# Patient Record
Sex: Female | Born: 1965
Health system: Southern US, Community
[De-identification: ages and names within clinical notes are randomized; demographics above are authoritative.]

## PROBLEM LIST (undated history)

## (undated) DIAGNOSIS — F319 Bipolar disorder, unspecified: Secondary | ICD-10-CM

## (undated) DIAGNOSIS — E039 Hypothyroidism, unspecified: Secondary | ICD-10-CM

## (undated) DIAGNOSIS — F1010 Alcohol abuse, uncomplicated: Secondary | ICD-10-CM

## (undated) DIAGNOSIS — F4310 Post-traumatic stress disorder, unspecified: Secondary | ICD-10-CM

---

## 2016-09-19 ENCOUNTER — Inpatient Hospital Stay (HOSPITAL_COMMUNITY)
Admission: EM | Admit: 2016-09-19 | Discharge: 2016-09-20 | DRG: 894 | Discharge status: Against Medical Advice | Payer: Self-pay | Attending: Internal Medicine | Admitting: Internal Medicine

## 2016-09-19 ENCOUNTER — Emergency Department (HOSPITAL_COMMUNITY): Payer: Self-pay

## 2016-09-19 ENCOUNTER — Encounter (HOSPITAL_COMMUNITY): Payer: Self-pay | Admitting: Emergency Medicine

## 2016-09-19 DIAGNOSIS — R4182 Altered mental status, unspecified: Secondary | ICD-10-CM

## 2016-09-19 DIAGNOSIS — Y92481 Parking lot as the place of occurrence of the external cause: Secondary | ICD-10-CM

## 2016-09-19 DIAGNOSIS — G9340 Encephalopathy, unspecified: Secondary | ICD-10-CM | POA: Diagnosis present

## 2016-09-19 DIAGNOSIS — F10120 Alcohol abuse with intoxication, uncomplicated: Principal | ICD-10-CM | POA: Diagnosis present

## 2016-09-19 DIAGNOSIS — Y907 Blood alcohol level of 200-239 mg/100 ml: Secondary | ICD-10-CM | POA: Diagnosis present

## 2016-09-19 DIAGNOSIS — T1490XA Injury, unspecified, initial encounter: Secondary | ICD-10-CM

## 2016-09-19 DIAGNOSIS — G92 Toxic encephalopathy: Secondary | ICD-10-CM | POA: Diagnosis present

## 2016-09-19 DIAGNOSIS — Z789 Other specified health status: Secondary | ICD-10-CM

## 2016-09-19 DIAGNOSIS — T68XXXA Hypothermia, initial encounter: Secondary | ICD-10-CM | POA: Diagnosis present

## 2016-09-19 DIAGNOSIS — R402432 Glasgow coma scale score 3-8, at arrival to emergency department: Secondary | ICD-10-CM | POA: Diagnosis present

## 2016-09-19 DIAGNOSIS — J9602 Acute respiratory failure with hypercapnia: Secondary | ICD-10-CM | POA: Diagnosis present

## 2016-09-19 LAB — CBC WITH DIFFERENTIAL/PLATELET
BASOS ABS: 0.1 10*3/uL (ref 0.0–0.1)
Basophils Relative: 1 %
EOS PCT: 3 %
Eosinophils Absolute: 0.2 10*3/uL (ref 0.0–0.7)
HEMATOCRIT: 37.8 % (ref 36.0–46.0)
Hemoglobin: 12.1 g/dL (ref 12.0–15.0)
LYMPHS PCT: 42 %
Lymphs Abs: 2.4 10*3/uL (ref 0.7–4.0)
MCH: 26.1 pg (ref 26.0–34.0)
MCHC: 32 g/dL (ref 30.0–36.0)
MCV: 81.5 fL (ref 78.0–100.0)
MONOS PCT: 6 %
Monocytes Absolute: 0.3 10*3/uL (ref 0.1–1.0)
NEUTROS PCT: 48 %
Neutro Abs: 2.6 10*3/uL (ref 1.7–7.7)
Platelets: 301 10*3/uL (ref 150–400)
RBC: 4.64 MIL/uL (ref 3.87–5.11)
RDW: 23.8 % — AB (ref 11.5–15.5)
WBC: 5.6 10*3/uL (ref 4.0–10.5)

## 2016-09-19 LAB — COMPREHENSIVE METABOLIC PANEL
ALBUMIN: 4.1 g/dL (ref 3.5–5.0)
ALT: 25 U/L (ref 14–54)
AST: 30 U/L (ref 15–41)
Alkaline Phosphatase: 55 U/L (ref 38–126)
Anion gap: 11 (ref 5–15)
BILIRUBIN TOTAL: 0.3 mg/dL (ref 0.3–1.2)
BUN: 5 mg/dL — AB (ref 6–20)
CHLORIDE: 111 mmol/L (ref 101–111)
CO2: 20 mmol/L — ABNORMAL LOW (ref 22–32)
CREATININE: 0.61 mg/dL (ref 0.44–1.00)
Calcium: 8.9 mg/dL (ref 8.9–10.3)
GFR calc Af Amer: >60 mL/min (ref >60)
GFR calc non Af Amer: 54 mL/min — ABNORMAL LOW (ref >60)
GLUCOSE: 95 mg/dL (ref 65–99)
POTASSIUM: 3.9 mmol/L (ref 3.5–5.1)
Sodium: 142 mmol/L (ref 135–145)
TOTAL PROTEIN: 6.8 g/dL (ref 6.5–8.1)

## 2016-09-19 LAB — URINALYSIS, ROUTINE W REFLEX MICROSCOPIC
Bilirubin Urine: NEGATIVE
Glucose, UA: NEGATIVE mg/dL
Hgb urine dipstick: NEGATIVE
Ketones, ur: NEGATIVE mg/dL
Leukocytes, UA: NEGATIVE
NITRITE: NEGATIVE
PH: 5.5 (ref 5.0–8.0)
Protein, ur: NEGATIVE mg/dL

## 2016-09-19 LAB — RAPID URINE DRUG SCREEN, HOSP PERFORMED
AMPHETAMINES: NOT DETECTED
BENZODIAZEPINES: NOT DETECTED
Barbiturates: NOT DETECTED
COCAINE: NOT DETECTED
OPIATES: NOT DETECTED
Tetrahydrocannabinol: NOT DETECTED

## 2016-09-19 LAB — I-STAT CHEM 8, ED
BUN: 5 mg/dL — ABNORMAL LOW (ref 6–20)
CHLORIDE: 107 mmol/L (ref 101–111)
Calcium, Ion: 1.11 mmol/L — ABNORMAL LOW (ref 1.15–1.40)
Creatinine, Ser: 0.9 mg/dL (ref 0.44–1.00)
Glucose, Bld: 97 mg/dL (ref 65–99)
HEMATOCRIT: 38 % (ref 36.0–46.0)
HEMOGLOBIN: 12.9 g/dL (ref 12.0–15.0)
POTASSIUM: 3.9 mmol/L (ref 3.5–5.1)
Sodium: 145 mmol/L (ref 135–145)
TCO2: 23 mmol/L (ref 0–100)

## 2016-09-19 LAB — ETHANOL: ALCOHOL ETHYL (B): 232 mg/dL — AB (ref <5)

## 2016-09-19 LAB — I-STAT TROPONIN, ED: TROPONIN I, POC: 0 ng/mL (ref 0.00–0.08)

## 2016-09-19 LAB — I-STAT CG4 LACTIC ACID, ED: LACTIC ACID, VENOUS: 1.99 mmol/L — AB (ref 0.5–1.9)

## 2016-09-19 LAB — I-STAT BETA HCG BLOOD, ED (MC, WL, AP ONLY): HCG, QUANTITATIVE: 8.8 m[IU]/mL — AB (ref <5)

## 2016-09-19 LAB — AMMONIA: Ammonia: 28 umol/L (ref 9–35)

## 2016-09-19 MED ORDER — SODIUM CHLORIDE 0.9 % IV BOLUS (SEPSIS)
1000.0000 mL | Freq: Once | INTRAVENOUS | Status: AC
  Administered 2016-09-19: 1000 mL via INTRAVENOUS

## 2016-09-19 MED ORDER — NALOXONE HCL 0.4 MG/ML IJ SOLN: 0.4000 mg | Freq: Once | INTRAMUSCULAR | Status: DC

## 2016-09-19 MED ORDER — LORAZEPAM 2 MG/ML IJ SOLN
INTRAMUSCULAR | Status: AC
  Administered 2016-09-19: 1 mg
  Filled 2016-09-19: qty 1

## 2016-09-19 NOTE — ED Notes (Signed)
Bair hugger placed on patient.  

## 2016-09-19 NOTE — ED Triage Notes (Signed)
[facility] after being found down in a parking lot. Pt is unresponsive and only response is to painful stimuli. Pt was given 1 mg of Narcan via EMS. Pt smells of Etoh. Pupils 3 mm bilaterally and sluggish.

## 2016-09-19 NOTE — ED Provider Notes (Signed)
[facility]-EMERGENCY DEPT Provider Note   CSN: [id number] Arrival date & time: 09/19/16  2145     History   Chief Complaint Chief Complaint  Patient presents with  . Altered Mental Status    HPI Mary Dodson is a 50 y.o. female.  HPI Mary Dodson is a 50 y.o. female presents to ED with complaint of AMS. Pt was found in a parking lot laying on the ground, unable to awaken. Pt smells of alcohol. EMS attempted to administer narcan 1g, with no change in mental status.   History reviewed. No pertinent past medical history.  There are no active problems to display for this patient.   No past surgical history on file.  OB History    No data available       Home Medications    Prior to Admission medications   Not on File    Family History History reviewed. No pertinent family history.  Social History Social History  Substance Use Topics  . Smoking status: Not on file  . Smokeless tobacco: Not on file  . Alcohol use Not on file     Allergies   Patient has no allergy information on record.   Review of Systems Review of Systems  Unable to perform ROS: Mental status change     Physical Exam Updated Vital Signs BP 131/79   Pulse 108   Temp (!) 95.9 F (35.5 C) (Rectal)   Resp 18   SpO2 100%   Physical Exam  Constitutional: She appears well-developed and well-nourished.  aggitated  HENT:  Head: Normocephalic.  Eyes: Conjunctivae and EOM are normal. Pupils are equal, round, and reactive to light.  Neck: Normal range of motion. Neck supple.  Cardiovascular: Regular rhythm and normal heart sounds.   tachycardic  Pulmonary/Chest: Effort normal and breath sounds normal. No respiratory distress. She has no wheezes. She has no rales.  Abdominal: Soft. Bowel sounds are normal. She exhibits no distension. There is no tenderness. There is no rebound.  Musculoskeletal: She exhibits no edema.  Neurological:  Does not follow directions, unable to answer  questions, moving around stretcher  Skin: Skin is warm and dry. Capillary refill takes less than 2 seconds.  Psychiatric: She has a normal mood and affect. Her behavior is normal.  Nursing note and vitals reviewed.    ED Treatments / Results  Labs (all labs ordered are listed, but only abnormal results are displayed) Labs Reviewed  CBC WITH DIFFERENTIAL/PLATELET - Abnormal; Notable for the following:       Result Value   RDW 23.8 (*)    All other components within normal limits  COMPREHENSIVE METABOLIC PANEL - Abnormal; Notable for the following:    CO2 20 (*)    BUN 5 (*)    GFR calc non Af Amer 54 (*)    All other components within normal limits  URINALYSIS, ROUTINE W REFLEX MICROSCOPIC - Abnormal; Notable for the following:    Specific Gravity, Urine <1.005 (*)    All other components within normal limits  ETHANOL - Abnormal; Notable for the following:    Alcohol, Ethyl (B) 232 (*)    All other components within normal limits  I-STAT BETA HCG BLOOD, ED ([facility], [facility], [facility] ONLY) - Abnormal; Notable for the following:    I-stat hCG, quantitative 8.8 (*)    All other components within normal limits  I-STAT CHEM 8, ED - Abnormal; Notable for the following:    BUN 5 (*)  Calcium, Ion 1.11 (*)    All other components within normal limits  I-STAT CG4 LACTIC ACID, ED - Abnormal; Notable for the following:    Lactic Acid, Venous 1.99 (*)    All other components within normal limits  I-STAT ARTERIAL BLOOD GAS, ED - Abnormal; Notable for the following:    pH, Arterial 7.133 (*)    pCO2 arterial 65.2 (*)    pO2, Arterial 238.0 (*)    Acid-base deficit 8.0 (*)    All other components within normal limits  AMMONIA  RAPID URINE DRUG SCREEN, HOSP PERFORMED  TRIGLYCERIDES  TROPONIN I  TROPONIN I  TROPONIN I  CBC  BASIC METABOLIC PANEL  MAGNESIUM  PHOSPHORUS  BLOOD GAS, ARTERIAL  TRIGLYCERIDES  OSMOLALITY  I-STAT TROPOININ, ED  I-STAT CG4 LACTIC ACID, ED    EKG  EKG  Interpretation  Date/Time:  Tuesday September 19 2016 22:53:59 EST Ventricular Rate:  104 PR Interval:    QRS Duration: 99 QT Interval:  357 QTC Calculation: 470 R Axis:   90 Text Interpretation:  Sinus tachycardia Borderline right axis deviation Consider anterior infarct Confirmed by HAVILAND MD, JULIE ([id number]) on 09/19/2016 10:55:39 PM       Radiology Ct Head Wo Contrast  Result Date: 09/19/2016 CLINICAL DATA:  Patient found down and unresponsive EXAM: CT HEAD WITHOUT CONTRAST CT CERVICAL SPINE WITHOUT CONTRAST TECHNIQUE: Multidetector CT imaging of the head and cervical spine was performed following the standard protocol without intravenous contrast. Multiplanar CT image reconstructions of the cervical spine were also generated. COMPARISON:  None. FINDINGS: CT HEAD FINDINGS Brain: No mass lesion, intraparenchymal hemorrhage or extra-axial collection. No evidence of acute cortical infarct. Brain parenchyma and CSF-containing spaces are normal for age. Vascular: No hyperdense vessel or unexpected calcification. Skull: Normal visualized skull base, calvarium and extracranial soft tissues. Sinuses/Orbits: No sinus fluid levels or advanced mucosal thickening. No mastoid effusion. Normal orbits. CT CERVICAL SPINE FINDINGS Alignment: No static subluxation. Facets are aligned. Occipital condyles are normally positioned. Skull base and vertebrae: No acute fracture. Soft tissues and spinal canal: No prevertebral fluid or swelling. No visible canal hematoma. Disc levels: No advanced spinal canal or neural foraminal stenosis. Upper chest: No pneumothorax, pulmonary nodule or pleural effusion. Other: Normal visualized paraspinal cervical soft tissues. IMPRESSION: 1. No acute intracranial abnormality. 2. No acute fracture or static subluxation of the cervical spine. Electronically Signed   By: Deatra RobinsonKevin  Herman M.D.   On: 09/19/2016 22:32   Ct Cervical Spine Wo Contrast  Result Date: 09/19/2016 CLINICAL DATA:   Patient found down and unresponsive EXAM: CT HEAD WITHOUT CONTRAST CT CERVICAL SPINE WITHOUT CONTRAST TECHNIQUE: Multidetector CT imaging of the head and cervical spine was performed following the standard protocol without intravenous contrast. Multiplanar CT image reconstructions of the cervical spine were also generated. COMPARISON:  None. FINDINGS: CT HEAD FINDINGS Brain: No mass lesion, intraparenchymal hemorrhage or extra-axial collection. No evidence of acute cortical infarct. Brain parenchyma and CSF-containing spaces are normal for age. Vascular: No hyperdense vessel or unexpected calcification. Skull: Normal visualized skull base, calvarium and extracranial soft tissues. Sinuses/Orbits: No sinus fluid levels or advanced mucosal thickening. No mastoid effusion. Normal orbits. CT CERVICAL SPINE FINDINGS Alignment: No static subluxation. Facets are aligned. Occipital condyles are normally positioned. Skull base and vertebrae: No acute fracture. Soft tissues and spinal canal: No prevertebral fluid or swelling. No visible canal hematoma. Disc levels: No advanced spinal canal or neural foraminal stenosis. Upper chest: No pneumothorax, pulmonary nodule or pleural effusion. Other:  Normal visualized paraspinal cervical soft tissues. IMPRESSION: 1. No acute intracranial abnormality. 2. No acute fracture or static subluxation of the cervical spine. Electronically Signed   By: Deatra [name] M.D.   On: 09/19/2016 22:32   Dg Chest Portable 1 View  Result Date: 09/20/2016 CLINICAL DATA:  Orogastric and endotracheal tube placement EXAM: PORTABLE CHEST 1 VIEW COMPARISON:  Chest radiograph 09/19/2016 FINDINGS: Endotracheal tube tip is at the level of the clavicular heads. Orogastric tube side port overlies the gastric body. The tip extends beyond the field of view. Cardiomediastinal contours are normal. No focal airspace consolidation or pulmonary edema. IMPRESSION: Radiographically appropriate positioning of  endotracheal and orogastric tubes. Electronically Signed   By: Deatra [name] M.D.   On: 09/20/2016 01:17   Dg Chest Portable 1 View  Result Date: 09/19/2016 CLINICAL DATA:  Altered mental status patient found down in parking lot EXAM: PORTABLE CHEST 1 VIEW COMPARISON:  None. FINDINGS: No acute infiltrate or effusion. Normal heart size. Minimally elevated right diaphragm. No pneumothorax. Surgical clips in the left neck. IMPRESSION: No radiographic evidence for acute cardiopulmonary abnormality Electronically Signed   By: Jasmine Pang M.D.   On: 09/19/2016 22:18    Procedures Procedures (including critical care time)  Medications Ordered in ED Medications  naloxone [facility]) injection 0.4 mg (0 mg Intravenous Hold 09/19/16 2311)  etomidate (AMIDATE) injection 20 mg (20 mg Intravenous See Procedure Record 09/20/16 0116)  succinylcholine (ANECTINE) injection 100 mg (100 mg Intravenous See Procedure Record 09/20/16 0117)  0.9 %  sodium chloride infusion (1,000 mL/hr Intravenous New Bag/Given 09/20/16 0024)  heparin injection 5,000 Units (not administered)  famotidine (PEPCID) IVPB 20 mg premix (not administered)  dextrose 5 %-0.45 % sodium chloride infusion (not administered)  ipratropium-albuterol (DUONEB) 0.5-2.5 (3) MG/3ML nebulizer solution 3 mL (not administered)  albuterol (PROVENTIL) (2.5 MG/3ML) 0.083% nebulizer solution 2.5 mg (not administered)  fentaNYL (SUBLIMAZE) injection 100 mcg (100 mcg Intravenous Given 09/20/16 0145)  fentaNYL (SUBLIMAZE) injection 100 mcg (not administered)  propofol (DIPRIVAN) 1000 MG/100ML infusion (not administered)  midazolam (VERSED) injection 2 mg (not administered)  midazolam (VERSED) injection 2 mg (not administered)  methylPREDNISolone sodium succinate (SOLU-MEDROL) 40 mg/mL injection 40 mg (not administered)  thiamine (B-1) injection 100 mg (not administered)  folic acid injection 1 mg (not administered)  sodium chloride 0.9 % bolus 1,000 mL (0  mLs Intravenous Stopped 09/19/16 2318)  sodium chloride 0.9 % bolus 1,000 mL (0 mLs Intravenous Stopped 09/20/16 0104)  LORazepam (ATIVAN) 2 MG/ML injection (1 mg  Given 09/19/16 2243)  etomidate (AMIDATE) injection (20 mg Intravenous Given 09/20/16 0020)  succinylcholine (ANECTINE) injection (100 mg Intravenous Given 09/20/16 0020)  midazolam (VERSED) injection 5 mg (5 mg Intravenous Given 09/20/16 0130)  fentaNYL (SUBLIMAZE) injection 100 mcg (100 mcg Intravenous Given 09/20/16 0101)     Initial Impression / Assessment and Plan / ED Course  I have reviewed the triage vital signs and the nursing notes.  Pertinent labs & imaging results that were available during my care of the patient were reviewed by me and considered in my medical decision making (see chart for details).  Clinical Course    10:31 PM Pt initically came to the room, seen by myself and Dr. Particia Nearing, will send to CT given mental status changes. Pt brought back to the room after CT, more awake, however aggitated, not following directions. Pt feels cold. Rectal temp 95.8. Labs pending. Will place on biar hugger, start IV fluids.   11:08 PM Patient became more  and more agitated, unable to follow directions, trying to climb out of the bed, fighting. 1 mg of Ativan given IV. Patient's lab work pretty much unremarkable, lactic is 1.99, normal electrolytes, normal CBC. Ammonia level is 28. Alcohol is 232. Urinalysis and drug screen pending.    Drug screen negative. Discussed with Dr. Julian ReilGardner with [facility] who came and saw pt, advised intubation in setting of GCS of 7. Pt is sedated with 1mg  of ativan given to him earlier, she is sleeping, snorring, protecting airway. Does not follow directions, does not open eyes on command, localizing to painful stimuli.   Intubated by Dr. Particia NearingHaviland. Spoke with Critical Care, will admit.   1:57 AM Pt again became more aggitated. Propofol up to 15mcg. BP soft in 90s systolic. Versed orderd 5mg  IV.     CRITICAL CARE Performed by: Jaynie CrumbleKIRICHENKO, Henny Strauch A Total critical care time: 30 minutes Critical care time was exclusive of separately billable procedures and treating other patients. Critical care was necessary to treat or prevent imminent or life-threatening deterioration. Critical care was time spent personally by me on the following activities: development of treatment plan with patient and/or surrogate as well as nursing, discussions with consultants, evaluation of patient's response to treatment, examination of patient, obtaining history from patient or surrogate, ordering and performing treatments and interventions, ordering and review of laboratory studies, ordering and review of radiographic studies, pulse oximetry and re-evaluation of patient's condition.     Final Clinical Impressions(s) / ED Diagnoses   Final diagnoses:  Airway intubation performed without difficulty  Altered mental status, unspecified altered mental status type    New Prescriptions New Prescriptions   No medications on file     Jaynie Crumbleatyana Taje Tondreau, PA-C 09/21/16 1516    Jacalyn LefevreJulie Haviland, MD 09/22/16 [id number]

## 2016-09-20 ENCOUNTER — Emergency Department (HOSPITAL_COMMUNITY): Payer: Self-pay

## 2016-09-20 DIAGNOSIS — Z789 Other specified health status: Secondary | ICD-10-CM | POA: Insufficient documentation

## 2016-09-20 DIAGNOSIS — F10929 Alcohol use, unspecified with intoxication, unspecified: Secondary | ICD-10-CM | POA: Insufficient documentation

## 2016-09-20 DIAGNOSIS — G9340 Encephalopathy, unspecified: Secondary | ICD-10-CM

## 2016-09-20 DIAGNOSIS — T68XXXA Hypothermia, initial encounter: Secondary | ICD-10-CM | POA: Insufficient documentation

## 2016-09-20 DIAGNOSIS — J441 Chronic obstructive pulmonary disease with (acute) exacerbation: Secondary | ICD-10-CM | POA: Insufficient documentation

## 2016-09-20 LAB — CBC
HCT: 34.9 % — ABNORMAL LOW (ref 36.0–46.0)
Hemoglobin: 10.9 g/dL — ABNORMAL LOW (ref 12.0–15.0)
MCH: 25.6 pg — AB (ref 26.0–34.0)
MCHC: 31.2 g/dL (ref 30.0–36.0)
MCV: 81.9 fL (ref 78.0–100.0)
PLATELETS: 319 10*3/uL (ref 150–400)
RBC: 4.26 MIL/uL (ref 3.87–5.11)
RDW: 23.7 % — AB (ref 11.5–15.5)
WBC: 6.5 10*3/uL (ref 4.0–10.5)

## 2016-09-20 LAB — I-STAT ARTERIAL BLOOD GAS, ED
Acid-base deficit: 8 mmol/L — ABNORMAL HIGH (ref 0.0–2.0)
BICARBONATE: 22 mmol/L (ref 20.0–28.0)
O2 Saturation: 100 %
PO2 ART: 238 mmHg — AB (ref 83.0–108.0)
Patient temperature: 97.6
TCO2: 24 mmol/L (ref 0–100)
pCO2 arterial: 65.2 mmHg (ref 32.0–48.0)
pH, Arterial: 7.133 — CL (ref 7.350–7.450)

## 2016-09-20 LAB — BASIC METABOLIC PANEL
Anion gap: 9 (ref 5–15)
BUN: 6 mg/dL (ref 6–20)
CO2: 19 mmol/L — ABNORMAL LOW (ref 22–32)
CREATININE: 0.61 mg/dL (ref 0.44–1.00)
Calcium: 7.2 mg/dL — ABNORMAL LOW (ref 8.9–10.3)
Chloride: 118 mmol/L — ABNORMAL HIGH (ref 101–111)
GFR calc Af Amer: >60 mL/min (ref >60)
GFR, EST NON AFRICAN AMERICAN: 54 mL/min — AB (ref >60)
GLUCOSE: 134 mg/dL — AB (ref 65–99)
POTASSIUM: 4.4 mmol/L (ref 3.5–5.1)
SODIUM: 146 mmol/L — AB (ref 135–145)

## 2016-09-20 LAB — BLOOD GAS, ARTERIAL
Acid-base deficit: 4.9 mmol/L — ABNORMAL HIGH (ref 0.0–2.0)
BICARBONATE: 20.2 mmol/L (ref 20.0–28.0)
DRAWN BY: 418751
FIO2: 60
LHR: 24 {breaths}/min
MECHVT: 440 mL
O2 Saturation: 98.8 %
PATIENT TEMPERATURE: 98.6
PCO2 ART: 41.5 mmHg (ref 32.0–48.0)
PEEP/CPAP: 5 cmH2O
PO2 ART: 185 mmHg — AB (ref 83.0–108.0)
pH, Arterial: 7.309 — ABNORMAL LOW (ref 7.350–7.450)

## 2016-09-20 LAB — MRSA PCR SCREENING: MRSA BY PCR: NEGATIVE

## 2016-09-20 LAB — VOLATILES,BLD-ACETONE,ETHANOL,ISOPROP,METHANOL
Acetone, blood: NEGATIVE % (ref 0.000–0.010)
ETHANOL, BLOOD: 0.07 % — AB (ref 0.000–0.010)
Isopropanol, blood: NEGATIVE % (ref 0.000–0.010)
Methanol, blood: NEGATIVE % (ref 0.000–0.010)

## 2016-09-20 LAB — I-STAT CG4 LACTIC ACID, ED: Lactic Acid, Venous: 1.51 mmol/L (ref 0.5–1.9)

## 2016-09-20 LAB — TRIGLYCERIDES: Triglycerides: 109 mg/dL (ref <150)

## 2016-09-20 LAB — MAGNESIUM: MAGNESIUM: 1.4 mg/dL — AB (ref 1.7–2.4)

## 2016-09-20 LAB — GLUCOSE, CAPILLARY: Glucose-Capillary: 79 mg/dL (ref 65–99)

## 2016-09-20 LAB — PHOSPHORUS: Phosphorus: 4.6 mg/dL (ref 2.5–4.6)

## 2016-09-20 LAB — TROPONIN I: Troponin I: <0.03 ng/mL (ref <0.03)

## 2016-09-20 MED ORDER — VITAMIN B-1 100 MG PO TABS: 100.0000 mg | ORAL_TABLET | Freq: Every day | ORAL | Status: DC

## 2016-09-20 MED ORDER — DEXTROSE-NACL 5-0.45 % IV SOLN
INTRAVENOUS | Status: DC
  Administered 2016-09-20: 03:00:00 via INTRAVENOUS

## 2016-09-20 MED ORDER — THIAMINE HCL 100 MG/ML IJ SOLN
100.0000 mg | Freq: Every day | INTRAMUSCULAR | Status: DC
  Administered 2016-09-20: 100 mg via INTRAVENOUS
  Filled 2016-09-20: qty 2

## 2016-09-20 MED ORDER — ETOMIDATE 2 MG/ML IV SOLN
INTRAVENOUS | Status: AC | PRN
Start: 2016-09-20 — End: 2016-09-20
  Administered 2016-09-20: 20 mg via INTRAVENOUS

## 2016-09-20 MED ORDER — LORAZEPAM 1 MG PO TABS: 1.0000 mg | ORAL_TABLET | Freq: Four times a day (QID) | ORAL | Status: DC | PRN

## 2016-09-20 MED ORDER — MAGNESIUM SULFATE 4 GM/100ML IV SOLN
4.0000 g | Freq: Once | INTRAVENOUS | Status: AC
  Administered 2016-09-20: 4 g via INTRAVENOUS
  Filled 2016-09-20: qty 100

## 2016-09-20 MED ORDER — ALBUTEROL SULFATE (2.5 MG/3ML) 0.083% IN NEBU: 2.5000 mg | INHALATION_SOLUTION | RESPIRATORY_TRACT | Status: DC | PRN

## 2016-09-20 MED ORDER — PROPOFOL 1000 MG/100ML IV EMUL
INTRAVENOUS | Status: AC
  Administered 2016-09-20: 10 ug/kg/min via INTRAVENOUS
  Filled 2016-09-20: qty 100

## 2016-09-20 MED ORDER — LORAZEPAM 2 MG/ML IJ SOLN: 1.0000 mg | Freq: Four times a day (QID) | INTRAMUSCULAR | Status: DC | PRN

## 2016-09-20 MED ORDER — ORAL CARE MOUTH RINSE: 15.0000 mL | Freq: Four times a day (QID) | OROMUCOSAL | Status: DC

## 2016-09-20 MED ORDER — FENTANYL CITRATE (PF) 100 MCG/2ML IJ SOLN
100.0000 ug | Freq: Once | INTRAMUSCULAR | Status: AC
  Administered 2016-09-20: 100 ug via INTRAVENOUS

## 2016-09-20 MED ORDER — METHYLPREDNISOLONE SODIUM SUCC 40 MG IJ SOLR
40.0000 mg | Freq: Two times a day (BID) | INTRAMUSCULAR | Status: DC
  Administered 2016-09-20: 40 mg via INTRAVENOUS
  Filled 2016-09-20 (×2): qty 1

## 2016-09-20 MED ORDER — MIDAZOLAM HCL 2 MG/2ML IJ SOLN
2.0000 mg | INTRAMUSCULAR | Status: AC | PRN
  Administered 2016-09-20 (×3): 2 mg via INTRAVENOUS
  Filled 2016-09-20 (×3): qty 2

## 2016-09-20 MED ORDER — HEPARIN SODIUM (PORCINE) 5000 UNIT/ML IJ SOLN
5000.0000 [IU] | Freq: Three times a day (TID) | INTRAMUSCULAR | Status: DC
  Administered 2016-09-20: 5000 [IU] via SUBCUTANEOUS
  Filled 2016-09-20 (×2): qty 1

## 2016-09-20 MED ORDER — FENTANYL CITRATE (PF) 100 MCG/2ML IJ SOLN
100.0000 ug | INTRAMUSCULAR | Status: DC | PRN
  Administered 2016-09-20 (×2): 100 ug via INTRAVENOUS
  Filled 2016-09-20 (×2): qty 2

## 2016-09-20 MED ORDER — SODIUM CHLORIDE 0.9 % IV SOLN
2.0000 g | Freq: Once | INTRAVENOUS | Status: AC
  Administered 2016-09-20: 2 g via INTRAVENOUS
  Filled 2016-09-20: qty 20

## 2016-09-20 MED ORDER — PROPOFOL 1000 MG/100ML IV EMUL
5.0000 ug/kg/min | INTRAVENOUS | Status: DC
  Administered 2016-09-20: 10 ug/kg/min via INTRAVENOUS

## 2016-09-20 MED ORDER — ADULT MULTIVITAMIN W/MINERALS CH
1.0000 | ORAL_TABLET | Freq: Every day | ORAL | Status: DC
  Filled 2016-09-20: qty 1

## 2016-09-20 MED ORDER — CHLORHEXIDINE GLUCONATE 0.12% ORAL RINSE (MEDLINE KIT)
15.0000 mL | Freq: Two times a day (BID) | OROMUCOSAL | Status: DC
  Administered 2016-09-20: 15 mL via OROMUCOSAL

## 2016-09-20 MED ORDER — FOLIC ACID 5 MG/ML IJ SOLN
1.0000 mg | Freq: Every day | INTRAMUSCULAR | Status: DC
  Administered 2016-09-20: 1 mg via INTRAVENOUS
  Filled 2016-09-20: qty 0.2

## 2016-09-20 MED ORDER — MIDAZOLAM HCL 2 MG/2ML IJ SOLN
2.0000 mg | INTRAMUSCULAR | Status: DC | PRN
  Administered 2016-09-20: 2 mg via INTRAVENOUS
  Filled 2016-09-20: qty 2

## 2016-09-20 MED ORDER — MIDAZOLAM HCL 2 MG/2ML IJ SOLN
5.0000 mg | Freq: Once | INTRAMUSCULAR | Status: AC
  Administered 2016-09-20: 5 mg via INTRAVENOUS
  Filled 2016-09-20: qty 6

## 2016-09-20 MED ORDER — ORAL CARE MOUTH RINSE: 15.0000 mL | Freq: Two times a day (BID) | OROMUCOSAL | Status: DC

## 2016-09-20 MED ORDER — SUCCINYLCHOLINE CHLORIDE 20 MG/ML IJ SOLN
100.0000 mg | Freq: Once | INTRAMUSCULAR | Status: DC
  Filled 2016-09-20: qty 5

## 2016-09-20 MED ORDER — FOLIC ACID 1 MG PO TABS: 1.0000 mg | ORAL_TABLET | Freq: Every day | ORAL | Status: DC

## 2016-09-20 MED ORDER — FENTANYL CITRATE (PF) 100 MCG/2ML IJ SOLN
INTRAMUSCULAR | Status: AC
  Filled 2016-09-20: qty 2

## 2016-09-20 MED ORDER — FENTANYL CITRATE (PF) 100 MCG/2ML IJ SOLN
100.0000 ug | INTRAMUSCULAR | Status: AC | PRN
  Administered 2016-09-20 (×3): 100 ug via INTRAVENOUS
  Filled 2016-09-20 (×3): qty 2

## 2016-09-20 MED ORDER — IPRATROPIUM-ALBUTEROL 0.5-2.5 (3) MG/3ML IN SOLN
3.0000 mL | Freq: Four times a day (QID) | RESPIRATORY_TRACT | Status: DC
  Administered 2016-09-20 (×2): 3 mL via RESPIRATORY_TRACT
  Filled 2016-09-20 (×2): qty 3

## 2016-09-20 MED ORDER — FAMOTIDINE IN NACL 20-0.9 MG/50ML-% IV SOLN
20.0000 mg | Freq: Two times a day (BID) | INTRAVENOUS | Status: DC
  Administered 2016-09-20 (×2): 20 mg via INTRAVENOUS
  Filled 2016-09-20 (×3): qty 50

## 2016-09-20 MED ORDER — PROPOFOL 1000 MG/100ML IV EMUL
0.0000 ug/kg/min | INTRAVENOUS | Status: DC
  Administered 2016-09-20 (×2): 60 ug/kg/min via INTRAVENOUS
  Filled 2016-09-20: qty 100

## 2016-09-20 MED ORDER — PROPOFOL 1000 MG/100ML IV EMUL
0.0000 ug/kg/min | INTRAVENOUS | Status: DC
  Administered 2016-09-20: 20 ug/kg/min via INTRAVENOUS
  Filled 2016-09-20: qty 100

## 2016-09-20 MED ORDER — SODIUM CHLORIDE 0.9 % IV SOLN
INTRAVENOUS | Status: DC | PRN
  Administered 2016-09-20: 1000 mL via INTRAVENOUS
  Administered 2016-09-20: 1000 mL/h via INTRAVENOUS

## 2016-09-20 MED ORDER — ETOMIDATE 2 MG/ML IV SOLN: 20.0000 mg | Freq: Once | INTRAVENOUS | Status: DC

## 2016-09-20 MED ORDER — CHLORHEXIDINE GLUCONATE 0.12% ORAL RINSE (MEDLINE KIT): 15.0000 mL | Freq: Two times a day (BID) | OROMUCOSAL | Status: DC

## 2016-09-20 MED ORDER — SUCCINYLCHOLINE CHLORIDE 20 MG/ML IJ SOLN
INTRAMUSCULAR | Status: AC | PRN
  Administered 2016-09-20: 100 mg via INTRAVENOUS

## 2016-09-20 MED ORDER — CHLORHEXIDINE GLUCONATE 0.12 % MT SOLN
15.0000 mL | Freq: Two times a day (BID) | OROMUCOSAL | Status: DC
  Administered 2016-09-20: 15 mL via OROMUCOSAL

## 2016-09-20 NOTE — ED Notes (Signed)
Pt attempting to rise up in bed again; attempting to obtain RASS goal at this time

## 2016-09-20 NOTE — ED Notes (Signed)
Pt attempting to sit up in bed and pulling at tubes and wires; pt pulled OG tube out; will replace when patient more sedated and comfortable

## 2016-09-20 NOTE — Progress Notes (Signed)
eLink Physician-Brief Progress Note Patient Name: Mary CreedCourtland C Dodson DOB: 10/09/1875 MRN: [id number]   Date of Service  09/20/2016  HPI/Events of Note  Called by RN to report elevated measured serum osmolality of 341  eICU Interventions  This is probably fully accounted for by ethanol. Nonetheless, volatile alcohol panel ordered     Intervention Category Major Interventions: Other:  Merwyn KatosDavid B Iasha Mccalister 09/20/2016, 2:20 AM

## 2016-09-20 NOTE — Progress Notes (Signed)
E-Link notified of ABG results. Order was to increase RR to 24bpm

## 2016-09-20 NOTE — H&P (Signed)
PULMONARY / CRITICAL CARE MEDICINE   Name: Mary Dodson MRN: [id number] DOB: 10/09/1875    ADMISSION DATE:  09/19/2016 CONSULTATION DATE:  12/13  REFERRING MD:  Dr. Particia Nearing EDP  CHIEF COMPLAINT:  Unresponsive  BRIEF  Female presents to [facility] emergency department with unknown age, identity. She was found down in a parking lot and per EMS smelled of alcohol. They were unable to arouse her even after administering Narcan. Upon arrival to the emergency department she was hypothermic and underwent CT scan of the head which was nonacute. After returning from CT she did wake up a bit, but was aggressive and agitated. She was given Ativan and GCS was then later noted to be 6 so she ultimately required intubation. PCCM asked to admit. Laboratory evaluation in the emergency department is significant for a lactic acid 1.99, CO2 20,alcohol level 237.    SUBJECTIVE/OVERNIGHT/INTERVAL HX  09/20/16 10:13 AM-demanding extubation. Oriented. Banging on rails   VITAL SIGNS: BP 113/61   Pulse 85   Temp 98.8 F (37.1 C) (Oral)   Resp 12   Ht 5\' 4"  (1.626 m)   Wt 71.3 kg (157 lb 3 oz)   SpO2 100%   BMI 26.98 kg/m   HEMODYNAMICS:    VENTILATOR SETTINGS: Vent Mode: PSV;CPAP FiO2 (%):  [40 %-100 %] 40 % Set Rate:  [14 bmp-24 bmp] 24 bmp Vt Set:  [440 mL] 440 mL PEEP:  [5 cmH20] 5 cmH20 Pressure Support:  [5 cmH20-10 cmH20] 5 cmH20 Plateau Pressure:  [16 cmH20-18 cmH20] 18 cmH20  INTAKE / OUTPUT: I/O last 3 completed shifts: In: 63 [I.V.:3885; IV Piggyback:2050] Out: 1150 [Urine:1150]  PHYSICAL EXAMINATION: General:  Female who appears age 59-50 of normal body habitus  Neuro:  Demanding extubation. Oriented. Nods she knows her name and can write who she is or talk after etubation HEENT:  [place]/AT, PERRL, no JVD Cardiovascular:  RRR, no MRG Lungs:NO expiratory wheeze Abdomen:  Soft, non-distended Musculoskeletal:  No acute deformity Skin:  Grossly intact, no evidence of  IVDA  LABS:  PULMONARY  Recent Labs Lab 09/19/16 2213 09/20/16 0110 09/20/16 0340  PHART  --  7.133* 7.309*  PCO2ART  --  65.2* 41.5  PO2ART  --  238.0* 185*  HCO3  --  22.0 20.2  TCO2 23 24  --   O2SAT  --  100.0 98.8    CBC  Recent Labs Lab 09/19/16 2210 09/19/16 2213 09/20/16 0122  HGB 12.1 12.9 10.9*  HCT 37.8 38.0 34.9*  WBC 5.6  --  6.5  PLT 301  --  319    COAGULATION No results for input(s): INR in the last 168 hours.  CARDIAC   Recent Labs Lab 09/20/16 0122  TROPONINI <0.03   No results for input(s): PROBNP in the last 168 hours.   CHEMISTRY  Recent Labs Lab 09/19/16 2210 09/19/16 2213 09/20/16 0122  NA 142 145 146*  K 3.9 3.9 4.4  CL 111 107 118*  CO2 20*  --  19*  GLUCOSE 95 97 134*  BUN 5* 5* 6  CREATININE 0.61 0.90 0.61  CALCIUM 8.9  --  7.2*  MG  --   --  1.4*  PHOS  --   --  4.6   Estimated Creatinine Clearance: 0 mL/min (by C-G formula based on SCr of 0.61 mg/dL).   LIVER  Recent Labs Lab 09/19/16 2210  AST 30  ALT 25  ALKPHOS 55  BILITOT 0.3  PROT 6.8  ALBUMIN 4.1  INFECTIOUS  Recent Labs Lab 09/19/16 2235 09/20/16 0124  LATICACIDVEN 1.99* 1.51     ENDOCRINE CBG (last 3)   Recent Labs  09/20/16 0254  GLUCAP 79         IMAGING x48h  - image(s) personally visualized  -   highlighted in bold Ct Head Wo Contrast  Result Date: 09/19/2016 CLINICAL DATA:  Patient found down and unresponsive EXAM: CT HEAD WITHOUT CONTRAST CT CERVICAL SPINE WITHOUT CONTRAST TECHNIQUE: Multidetector CT imaging of the head and cervical spine was performed following the standard protocol without intravenous contrast. Multiplanar CT image reconstructions of the cervical spine were also generated. COMPARISON:  None. FINDINGS: CT HEAD FINDINGS Brain: No mass lesion, intraparenchymal hemorrhage or extra-axial collection. No evidence of acute cortical infarct. Brain parenchyma and CSF-containing spaces are normal for age.  Vascular: No hyperdense vessel or unexpected calcification. Skull: Normal visualized skull base, calvarium and extracranial soft tissues. Sinuses/Orbits: No sinus fluid levels or advanced mucosal thickening. No mastoid effusion. Normal orbits. CT CERVICAL SPINE FINDINGS Alignment: No static subluxation. Facets are aligned. Occipital condyles are normally positioned. Skull base and vertebrae: No acute fracture. Soft tissues and spinal canal: No prevertebral fluid or swelling. No visible canal hematoma. Disc levels: No advanced spinal canal or neural foraminal stenosis. Upper chest: No pneumothorax, pulmonary nodule or pleural effusion. Other: Normal visualized paraspinal cervical soft tissues. IMPRESSION: 1. No acute intracranial abnormality. 2. No acute fracture or static subluxation of the cervical spine. Electronically Signed   By: Deatra RobinsonKevin  Herman M.D.   On: 09/19/2016 22:32   Ct Cervical Spine Wo Contrast  Result Date: 09/19/2016 CLINICAL DATA:  Patient found down and unresponsive EXAM: CT HEAD WITHOUT CONTRAST CT CERVICAL SPINE WITHOUT CONTRAST TECHNIQUE: Multidetector CT imaging of the head and cervical spine was performed following the standard protocol without intravenous contrast. Multiplanar CT image reconstructions of the cervical spine were also generated. COMPARISON:  None. FINDINGS: CT HEAD FINDINGS Brain: No mass lesion, intraparenchymal hemorrhage or extra-axial collection. No evidence of acute cortical infarct. Brain parenchyma and CSF-containing spaces are normal for age. Vascular: No hyperdense vessel or unexpected calcification. Skull: Normal visualized skull base, calvarium and extracranial soft tissues. Sinuses/Orbits: No sinus fluid levels or advanced mucosal thickening. No mastoid effusion. Normal orbits. CT CERVICAL SPINE FINDINGS Alignment: No static subluxation. Facets are aligned. Occipital condyles are normally positioned. Skull base and vertebrae: No acute fracture. Soft tissues and  spinal canal: No prevertebral fluid or swelling. No visible canal hematoma. Disc levels: No advanced spinal canal or neural foraminal stenosis. Upper chest: No pneumothorax, pulmonary nodule or pleural effusion. Other: Normal visualized paraspinal cervical soft tissues. IMPRESSION: 1. No acute intracranial abnormality. 2. No acute fracture or static subluxation of the cervical spine. Electronically Signed   By: Deatra RobinsonKevin  Herman M.D.   On: 09/19/2016 22:32   Dg Chest Portable 1 View  Result Date: 09/20/2016 CLINICAL DATA:  Orogastric and endotracheal tube placement EXAM: PORTABLE CHEST 1 VIEW COMPARISON:  Chest radiograph 09/19/2016 FINDINGS: Endotracheal tube tip is at the level of the clavicular heads. Orogastric tube side port overlies the gastric body. The tip extends beyond the field of view. Cardiomediastinal contours are normal. No focal airspace consolidation or pulmonary edema. IMPRESSION: Radiographically appropriate positioning of endotracheal and orogastric tubes. Electronically Signed   By: Deatra RobinsonKevin  Herman M.D.   On: 09/20/2016 01:17   Dg Chest Portable 1 View  Result Date: 09/19/2016 CLINICAL DATA:  Altered mental status patient found down in parking lot EXAM: PORTABLE CHEST 1  VIEW COMPARISON:  None. FINDINGS: No acute infiltrate or effusion. Normal heart size. Minimally elevated right diaphragm. No pneumothorax. Surgical clips in the left neck. IMPRESSION: No radiographic evidence for acute cardiopulmonary abnormality Electronically Signed   By: Jasmine PangKim  Fujinaga M.D.   On: 09/19/2016 22:18       STUDIES:  CT head/Cspine 12/12 > No acute intracranial abnormality. No acute fracture or static subluxation of the cervical spine.  CULTURES:   ANTIBIOTICS:   SIGNIFICANT EVENTS: 12/12 found down unresponsive in parking lot  LINES/TUBES: ETT 12/13 >  DISCUSSION:   ASSESSMENT / PLAN:  PULMONARY A: Acute hypercarbic respiratory failure Concern COPD exacerbation Tobacco  abuse  -meets extubation criteria  P:   extubate  CARDIOVASCULAR   Recent Labs Lab 09/20/16 0122  TROPONINI <0.03    A:  No known issues  P:  monitor  RENAL A:   hypomag andhypocalcemia  P:   reppletemag and calcium Follow BMP  GASTROINTESTINAL A:   No acute issues  P:   Can eat a4h after extubation and following protoocl PPI due to etoh  HEMATOLOGIC A:   No acute issues  P:  SQ heparin for VTE ppx   INFECTIOUS  A:   Hypothermia - likely from being down outside. 1L warm NS administered in ED and bair hugger applied   - normal 09/20/2016   P:   Follow CBC and fever curve Defer ABX for now  ENDOCRINE A:   No acute issues    P:   Follow glucose on chem  NEUROLOGIC A:   Acute metabolic/toxic encephalopathy ETOH abuse/intoxication   - resolved but not angry (not confused) dmemanding extubation. UDS negative. ETOH 232 on admit  P:   RASS goal: - 1 to -2 Dc Propofol infusion Dc PRN fentanyl Dc PRN versed Thamine, folate, IVF with dextrose.  CIWA protocol  FAMILY  - Updates: NO family known.   - Inter-disciplinary family meet or Palliative Care meeting due by:  12/21  DISPO posislbe dc home next 24h after exdtubation      Dr. Kalman ShanMurali Yesmin Mutch, M.D., [facility]C.P Pulmonary and Critical Care Medicine Staff Physician [facility] [place] [facility] Pager: [phone], If no answer or between  15:00h - 7:00h: call [phone]  09/20/2016 10:08 AM

## 2016-09-20 NOTE — H&P (Signed)
PULMONARY / CRITICAL CARE MEDICINE   Name: Mary Dodson MRN: [id number] DOB: 10/09/1875    ADMISSION DATE:  09/19/2016 CONSULTATION DATE:  12/13  REFERRING MD:  Dr. Particia Nearing EDP  CHIEF COMPLAINT:  Unresponsive  HISTORY OF PRESENT ILLNESS:   Female presents to [facility] emergency department with unknown age, identity. She was found down in a parking lot and per EMS smelled of alcohol. They were unable to arouse her even after administering Narcan. Upon arrival to the emergency department she was hypothermic and underwent CT scan of the head which was nonacute. After returning from CT she did wake up a bit, but was aggressive and agitated. She was given Ativan and GCS was then later noted to be 6 so she ultimately required intubation. PCCM asked to admit. Laboratory evaluation in the emergency department is significant for a lactic acid 1.99, CO2 20,alcohol level 237.  PAST MEDICAL HISTORY :  She  has no past medical history on file.  PAST SURGICAL HISTORY: She  has no past surgical history on file.  Allergies not on file  No current facility-administered medications on file prior to encounter.    No current outpatient prescriptions on file prior to encounter.    FAMILY HISTORY:  Her has no family status information on file.    SOCIAL HISTORY: She    REVIEW OF SYSTEMS:   unable  SUBJECTIVE:  unable  VITAL SIGNS: BP 120/78   Pulse (!) 121   Temp (!) 95.9 F (35.5 C) (Rectal)   Resp 20   Ht 5\' 4"  (1.626 m)   Wt 63.5 kg (140 lb)   SpO2 98%   BMI 24.03 kg/m   HEMODYNAMICS:    VENTILATOR SETTINGS: Vent Mode: PRVC FiO2 (%):  [60 %] 60 % Set Rate:  [14 bmp] 14 bmp Vt Set:  [440 mL] 440 mL PEEP:  [5 cmH20] 5 cmH20 Plateau Pressure:  [16 cmH20] 16 cmH20  INTAKE / OUTPUT: No intake/output data recorded.  PHYSICAL EXAMINATION: General:  Female who appears age 66-50 of normal body habitus  Neuro:  Somewhat agitated on vent HEENT:  [place]/AT, PERRL, no  JVD Cardiovascular:  RRR, no MRG Lungs:  Faint expiratory wheeze Abdomen:  Soft, non-distended Musculoskeletal:  No acute deformity Skin:  Grossly intact, no evidence of IVDA  LABS:  BMET  Recent Labs Lab 09/19/16 2210 09/19/16 2213  NA 142 145  K 3.9 3.9  CL 111 107  CO2 20*  --   BUN 5* 5*  CREATININE 0.61 0.90  GLUCOSE 95 97    Electrolytes  Recent Labs Lab 09/19/16 2210  CALCIUM 8.9    CBC  Recent Labs Lab 09/19/16 2210 09/19/16 2213  WBC 5.6  --   HGB 12.1 12.9  HCT 37.8 38.0  PLT 301  --     Coag's No results for input(s): APTT, INR in the last 168 hours.  Sepsis Markers  Recent Labs Lab 09/19/16 2235  LATICACIDVEN 1.99*    ABG No results for input(s): PHART, PCO2ART, PO2ART in the last 168 hours.  Liver Enzymes  Recent Labs Lab 09/19/16 2210  AST 30  ALT 25  ALKPHOS 55  BILITOT 0.3  ALBUMIN 4.1    Cardiac Enzymes No results for input(s): TROPONINI, PROBNP in the last 168 hours.  Glucose No results for input(s): GLUCAP in the last 168 hours.  Imaging Ct Head Wo Contrast  Result Date: 09/19/2016 CLINICAL DATA:  Patient found down and unresponsive EXAM: CT HEAD WITHOUT CONTRAST  CT CERVICAL SPINE WITHOUT CONTRAST TECHNIQUE: Multidetector CT imaging of the head and cervical spine was performed following the standard protocol without intravenous contrast. Multiplanar CT image reconstructions of the cervical spine were also generated. COMPARISON:  None. FINDINGS: CT HEAD FINDINGS Brain: No mass lesion, intraparenchymal hemorrhage or extra-axial collection. No evidence of acute cortical infarct. Brain parenchyma and CSF-containing spaces are normal for age. Vascular: No hyperdense vessel or unexpected calcification. Skull: Normal visualized skull base, calvarium and extracranial soft tissues. Sinuses/Orbits: No sinus fluid levels or advanced mucosal thickening. No mastoid effusion. Normal orbits. CT CERVICAL SPINE FINDINGS Alignment: No  static subluxation. Facets are aligned. Occipital condyles are normally positioned. Skull base and vertebrae: No acute fracture. Soft tissues and spinal canal: No prevertebral fluid or swelling. No visible canal hematoma. Disc levels: No advanced spinal canal or neural foraminal stenosis. Upper chest: No pneumothorax, pulmonary nodule or pleural effusion. Other: Normal visualized paraspinal cervical soft tissues. IMPRESSION: 1. No acute intracranial abnormality. 2. No acute fracture or static subluxation of the cervical spine. Electronically Signed   By: Deatra RobinsonKevin  Herman M.D.   On: 09/19/2016 22:32   Ct Cervical Spine Wo Contrast  Result Date: 09/19/2016 CLINICAL DATA:  Patient found down and unresponsive EXAM: CT HEAD WITHOUT CONTRAST CT CERVICAL SPINE WITHOUT CONTRAST TECHNIQUE: Multidetector CT imaging of the head and cervical spine was performed following the standard protocol without intravenous contrast. Multiplanar CT image reconstructions of the cervical spine were also generated. COMPARISON:  None. FINDINGS: CT HEAD FINDINGS Brain: No mass lesion, intraparenchymal hemorrhage or extra-axial collection. No evidence of acute cortical infarct. Brain parenchyma and CSF-containing spaces are normal for age. Vascular: No hyperdense vessel or unexpected calcification. Skull: Normal visualized skull base, calvarium and extracranial soft tissues. Sinuses/Orbits: No sinus fluid levels or advanced mucosal thickening. No mastoid effusion. Normal orbits. CT CERVICAL SPINE FINDINGS Alignment: No static subluxation. Facets are aligned. Occipital condyles are normally positioned. Skull base and vertebrae: No acute fracture. Soft tissues and spinal canal: No prevertebral fluid or swelling. No visible canal hematoma. Disc levels: No advanced spinal canal or neural foraminal stenosis. Upper chest: No pneumothorax, pulmonary nodule or pleural effusion. Other: Normal visualized paraspinal cervical soft tissues. IMPRESSION: 1.  No acute intracranial abnormality. 2. No acute fracture or static subluxation of the cervical spine. Electronically Signed   By: Deatra RobinsonKevin  Herman M.D.   On: 09/19/2016 22:32   Dg Chest Portable 1 View  Result Date: 09/19/2016 CLINICAL DATA:  Altered mental status patient found down in parking lot EXAM: PORTABLE CHEST 1 VIEW COMPARISON:  None. FINDINGS: No acute infiltrate or effusion. Normal heart size. Minimally elevated right diaphragm. No pneumothorax. Surgical clips in the left neck. IMPRESSION: No radiographic evidence for acute cardiopulmonary abnormality Electronically Signed   By: Jasmine PangKim  Fujinaga M.D.   On: 09/19/2016 22:18     STUDIES:  CT head/Cspine 12/12 > No acute intracranial abnormality. No acute fracture or static subluxation of the cervical spine.  CULTURES:   ANTIBIOTICS:   SIGNIFICANT EVENTS: 12/12 found down unresponsive in parking lot  LINES/TUBES: ETT 12/13 >  DISCUSSION:   ASSESSMENT / PLAN:  PULMONARY A: Acute hypercarbic respiratory failure Concern COPD exacerbation Tobacco abuse  P:   Full vent support ABG reviewed, will increase respiratory rate CXR Scheduled and PRN nebs Solumedrol 40mg  q 12h VAP bundle  CARDIOVASCULAR A:  No known issues  P:  Telemetry Trend troponin  RENAL A:   No acute issues  P:   Follow BMP  GASTROINTESTINAL A:  No acute issues  P:   NPO Pepcid for SUP  HEMATOLOGIC A:   No acute issues  P:  SQ heparin for VTE ppx Follow CBC  INFECTIOUS A:   Hypothermia - likely from being down outside. 1L warm NS administered in ED and bair hugger applied  P:   Follow CBC and fever curve Defer ABX for now  ENDOCRINE A:   No acute issues    P:   Follow glucose on chem  NEUROLOGIC A:   Acute metabolic/toxic encephalopathy ETOH abuse/intoxication  P:   RASS goal: - 1 to -2 Propofol infusion PRN fentanyl PRN versed Assess osmolar gap UDS pending Thamine, folate, IVF with dextrose.     FAMILY  - Updates: NO family known  - Inter-disciplinary family meet or Palliative Care meeting due by:  12/21   Joneen RoachPaul Adalea Handler, AGACNP-BC [facility] Pager [phone] or [phone]  09/20/2016 1:36 AM

## 2016-09-20 NOTE — Progress Notes (Signed)
Pt continue to be agitated, but weaned on ventilator. MD at the bedside, extubated pt personally. Pt alert,very irritable and agitated. States her name Mary Dodson, DOD 2065/11/26,"I live in the hotel down the street, allergy to Morphine" When asked the reaction, pt states "Bad, we already go over it, it is bad". MD and pharmacy notified and aware. No complication noted. Continue to monitor

## 2016-09-20 NOTE — Progress Notes (Signed)
Upon arrival to patient room to extubate patient, per MD order, patient had already been extubated by MD.  Patient sats currently 97%.  Vitals are stable.  Patient able to speak.  No evidence of stridor.  Will continue to monitor.

## 2016-09-20 NOTE — Progress Notes (Signed)
Patient requesting to leave AMA. Patient very irritable and agitated. CSW signing off as Patient refusing interventions. Please contact should new need(s) arise.    Enos FlingAshley Coralie Stanke, MSW, LCSW [facility]M Clinical Social Worker [phone]

## 2016-09-20 NOTE — Progress Notes (Signed)
AMA was signed by patient, MD and RN.  Pt states "no money for bus". Money for bus was given. IVs removed. Pt belongings(glassess, cigaret, lighter, shoes, pants, shirt and coat (which both were cut prior to admission on ICU floor) were walked by pt and pt walked out from ICU.

## 2016-09-20 NOTE — ED Notes (Signed)
Report called and given to nurse on 

## 2016-09-21 LAB — OSMOLALITY: OSMOLALITY: 341 mosm/kg — AB (ref 275–295)

## 2016-09-22 ENCOUNTER — Emergency Department (HOSPITAL_COMMUNITY): Payer: Medicaid - Out of State

## 2016-09-22 ENCOUNTER — Encounter (HOSPITAL_COMMUNITY): Payer: Self-pay | Admitting: *Deleted

## 2016-09-22 ENCOUNTER — Emergency Department (HOSPITAL_COMMUNITY)
Admission: EM | Admit: 2016-09-22 | Discharge: 2016-09-23 | Disposition: A | Discharge status: Home / Self Care | Payer: Medicaid - Out of State | Attending: Emergency Medicine | Admitting: Emergency Medicine

## 2016-09-22 DIAGNOSIS — F10920 Alcohol use, unspecified with intoxication, uncomplicated: Secondary | ICD-10-CM

## 2016-09-22 DIAGNOSIS — R51 Headache: Secondary | ICD-10-CM | POA: Insufficient documentation

## 2016-09-22 DIAGNOSIS — F17200 Nicotine dependence, unspecified, uncomplicated: Secondary | ICD-10-CM | POA: Insufficient documentation

## 2016-09-22 DIAGNOSIS — R519 Headache, unspecified: Secondary | ICD-10-CM

## 2016-09-22 DIAGNOSIS — F10120 Alcohol abuse with intoxication, uncomplicated: Secondary | ICD-10-CM | POA: Insufficient documentation

## 2016-09-22 HISTORY — DX: Post-traumatic stress disorder, unspecified: F43.10

## 2016-09-22 NOTE — ED Triage Notes (Signed)
[facility] ([facility] [address] found asleep/passed out in hotel Engineer, structuralelevator. Pt has a room at the hotel and her bank card and some of her other belongings are there.   Admits to 2 "edge beers", found asleep in hotel elevator, reports "wanting detox", alert, animated, NAD, calm, poor concentration, slurred speech, smells of ETOH. Abrasions noted to L knee, and multiple bilateral fingers.   EMS reports pt was alert, appropriate, ambulatory with UN-steady gait. VSS. When asked about complaints she states, "all of the above".

## 2016-09-22 NOTE — ED Notes (Signed)
No changes. Pt sleeping/ resting, NAD, calm, rise and fall of chest noted, repositions self, sitter present.  

## 2016-09-22 NOTE — ED Provider Notes (Signed)
[facility]-EMERGENCY DEPT Provider Note   CSN: [id number] Arrival date & time: 09/22/16  2128 By signing my name below, I, Linus GalasMaharshi Patel, attest that this documentation has been prepared under the direction and in the presence of TRW AutomotiveKelly Semaj Coburn, PA-C. Electronically Signed: Linus GalasMaharshi Patel, ED Scribe. 09/23/16. 10:42 PM.   History   Chief Complaint Chief Complaint  Patient presents with  . Alcohol Intoxication    The history is provided by the patient and the EMS personnel. The history is limited by the condition of the patient. No language interpreter was used.   LEVEL 5 CAVEAT DUE ALCOHOL INTOXICATION  HPI Comments: Mary Dodson is a 50 y.o. female who presents to the Emergency Department via EMS with a PMHx of PTSD complaining of alcohol intoxication. As per EMS, pt was found asleep in a hotel elevator. When the pt was asked why she was here she did not know why. She reports a HA but does not know why it hurts. Pt denies any SI, HI, or any other symptoms at this time.    Past Medical History:  Diagnosis Date  . PTSD (post-traumatic stress disorder)     There are no active problems to display for this patient.   History reviewed. No pertinent surgical history.  OB History    No data available       Home Medications    Prior to Admission medications   Medication Sig Start Date End Date Taking? Authorizing Provider  GABAPENTIN PO Take by mouth.   Yes Historical Provider, MD  QUEtiapine Fumarate (SEROQUEL PO) Take by mouth.   Yes Historical Provider, MD    Family History History reviewed. No pertinent family history.  Social History Social History  Substance Use Topics  . Smoking status: Current Every Day Smoker  . Smokeless tobacco: Never Used  . Alcohol use Yes     Allergies   Morphine and related   Review of Systems Review of Systems A complete 10 system review of systems was obtained and all systems are negative except as noted in the HPI and PMH.     Physical Exam Updated Vital Signs BP 108/64 (BP Location: Right Arm)   Pulse 89   Temp 98.1 F (36.7 C) (Oral)   Resp 22   SpO2 96%   Physical Exam  Constitutional: She is oriented to person, place, and time. She appears well-developed and well-nourished. No distress.  Nontoxic and in NAD. Sleeping on initial presentation.  HENT:  Head: Normocephalic and atraumatic.  Eyes: Conjunctivae and EOM are normal. No scleral icterus.  Neck: Normal range of motion.  Cardiovascular: Normal rate, regular rhythm and intact distal pulses.   Pulmonary/Chest: Effort normal. No respiratory distress. She has no wheezes. She has no rales.  Respirations even and unlabored  Musculoskeletal: Normal range of motion.  Neurological: She is alert and oriented to person, place, and time. She exhibits normal muscle tone. Coordination normal.  GCS 15. Patient moving all extremities.  Skin: Skin is warm and dry. No rash noted. She is not diaphoretic. No pallor.  Psychiatric: She has a normal mood and affect. Her behavior is normal.  Nursing note and vitals reviewed.    ED Treatments / Results  DIAGNOSTIC STUDIES: Oxygen Saturation is 98% on room air, normal by my interpretation.    COORDINATION OF CARE: 10:43 PM  Discussed treatment plan with pt at bedside and pt agreed to plan.  Labs (all labs ordered are listed, but only abnormal results are displayed) Labs Reviewed -  No data to display  EKG  EKG Interpretation None       Radiology Ct Head Wo Contrast  Result Date: 09/22/2016 CLINICAL DATA:  Initial valuation for acute headache, possible fall. EXAM: CT HEAD WITHOUT CONTRAST TECHNIQUE: Contiguous axial images were obtained from the base of the skull through the vertex without intravenous contrast. COMPARISON:  None. FINDINGS: Brain: Mildly advanced cerebral atrophy for patient age. No acute intracranial hemorrhage. No evidence for acute large vessel territory infarct. No mass lesion,  midline shift or mass effect. No hydrocephalus. No extra-axial fluid collection. Vascular: No hyperdense vessel. Skull: Scalp soft tissues demonstrate no acute abnormality. Calvarium intact. Sinuses/Orbits: The globes error soft tissues within normal limits. Visualized paranasal sinuses and mastoid air cells are clear. IMPRESSION: 1. No acute intracranial process identified. 2. Mild generalized cerebral atrophy. Electronically Signed   By: Rise MuBenjamin  McClintock M.D.   On: 09/22/2016 23:58    Procedures Procedures (including critical care time)  Medications Ordered in ED Medications - No data to display   Initial Impression / Assessment and Plan / ED Course  I have reviewed the triage vital signs and the nursing notes.  Pertinent labs & imaging results that were available during my care of the patient were reviewed by me and considered in my medical decision making (see chart for details).  Clinical Course     50 year old female presents to the emergency department with uncomplicated acute intoxication. She has been allowed to sober in the emergency department and she is now requesting discharge. Vitals stable. No indication for further emergent workup. Patient discharged in satisfactory condition. Resource guide provided.   Final Clinical Impressions(s) / ED Diagnoses   Final diagnoses:  Alcoholic intoxication without complication (HCC)  Bad headache    New Prescriptions New Prescriptions   No medications on file    I personally performed the services described in this documentation, which was scribed in my presence. The recorded information has been reviewed and is accurate.       Antony MaduraKelly Trayvon Trumbull, PA-C 09/23/16 [id number]    Jacalyn LefevreJulie Haviland, MD 09/23/16 [phone]

## 2016-09-23 ENCOUNTER — Emergency Department (HOSPITAL_COMMUNITY)
Admission: EM | Admit: 2016-09-23 | Discharge: 2016-09-23 | Disposition: A | Discharge status: Home / Self Care | Payer: Medicaid - Out of State | Attending: Emergency Medicine | Admitting: Emergency Medicine

## 2016-09-23 ENCOUNTER — Encounter (HOSPITAL_COMMUNITY): Payer: Self-pay | Admitting: *Deleted

## 2016-09-23 DIAGNOSIS — F10929 Alcohol use, unspecified with intoxication, unspecified: Secondary | ICD-10-CM

## 2016-09-23 DIAGNOSIS — F10230 Alcohol dependence with withdrawal, uncomplicated: Secondary | ICD-10-CM

## 2016-09-23 DIAGNOSIS — R51 Headache: Secondary | ICD-10-CM | POA: Insufficient documentation

## 2016-09-23 DIAGNOSIS — F17200 Nicotine dependence, unspecified, uncomplicated: Secondary | ICD-10-CM | POA: Insufficient documentation

## 2016-09-23 DIAGNOSIS — F10239 Alcohol dependence with withdrawal, unspecified: Secondary | ICD-10-CM | POA: Insufficient documentation

## 2016-09-23 DIAGNOSIS — F10930 Alcohol use, unspecified with withdrawal, uncomplicated: Secondary | ICD-10-CM

## 2016-09-23 MED ORDER — CHLORDIAZEPOXIDE HCL 25 MG PO CAPS: ORAL_CAPSULE | ORAL | 0 refills | Status: DC

## 2016-09-23 MED ORDER — ONDANSETRON 8 MG PO TBDP
8.0000 mg | ORAL_TABLET | Freq: Once | ORAL | Status: AC
  Administered 2016-09-23: 8 mg via ORAL
  Filled 2016-09-23: qty 1

## 2016-09-23 MED ORDER — LORAZEPAM 1 MG PO TABS
2.0000 mg | ORAL_TABLET | Freq: Once | ORAL | Status: AC
  Administered 2016-09-23: 2 mg via ORAL
  Filled 2016-09-23: qty 2

## 2016-09-23 NOTE — Discharge Instructions (Signed)
Use the outpatient resources information given to you on paper to locate an appropriate outpatient treatment facility for you.  Librium Rx for withdrawal symptoms. Take as prescribed.

## 2016-09-23 NOTE — ED Notes (Signed)
Pt sleeping, normal rise and fall of chest noted. No acute distress assessed.

## 2016-09-23 NOTE — ED Notes (Signed)
Cleaned small abrasions to 2nd, 3rd, and 5th digits on right hand.  Applied triple antibiotic ointment and band-aid. Pt states she sustained these abrasions from falling while drunk.

## 2016-09-23 NOTE — ED Notes (Signed)
Pt states, "feel like I'm having a panic attack and falling", lying supine in bed with mild nausea, HOB 30 degrees, rails up, given emesis bag, NAD.

## 2016-09-23 NOTE — ED Notes (Signed)
No changes. Pt sleeping/ resting, NAD, calm, rise and fall of chest noted, repositions self, sitter present.  

## 2016-09-23 NOTE — ED Notes (Signed)
Given snack, eating now, belongings returned to pt.

## 2016-09-23 NOTE — ED Notes (Signed)
Pt given bus pass at previous discharge today , 09/23/16 @ 0600.

## 2016-09-23 NOTE — ED Notes (Signed)
Pt alert, NAD, calm, interactive, speech clear, steady gait, states, "ready to go, have to go pay my hotel bill, will probably return later, not wanting to be seen for anything at this time".

## 2016-09-23 NOTE — ED Triage Notes (Addendum)
See previous chart. This RN familiar with this patient as primary RN for last 9 hours. Pt just d/c'd ~30 minutes ago at 0607, seen for alcohol intoxication, ambulatory to d/c desk, never left the building, decided she couldn't be d/c'd, "too sick", returned and checked back in, "wants detox". No changes.   From previous chart earlier tonight at 2147: [facility] on [address] found asleep/passed out in Data processing managerhotel elevator. Pt has a room at the hotel and her bank card and some of her other belongings are there.   Admits to 2 "edge beers", found asleep in hotel elevator, reports "wanting detox", alert, animated, NAD, calm, poor concentration, slurred speech, smells of ETOH. Abrasions noted to L knee, and multiple bilateral fingers.   EMS reports pt was alert, appropriate, ambulatory with UN-steady gait. VSS. When asked about complaints she states, "all of the above".

## 2016-09-23 NOTE — ED Notes (Addendum)
Pt ambulated to bathroom without difficulty. Normal gait. No assistance needed. She is now requesting clothes for discharge.

## 2016-09-23 NOTE — ED Provider Notes (Signed)
[facility]-EMERGENCY DEPT Provider Note   CSN: [id number] Arrival date & time: 09/23/16  [id number]     History   Chief Complaint Chief Complaint  Patient presents with  . Medical Clearance    HPI Mary Dodson is a 50 y.o. female. She presents less than 30 minutes after discharge, having never left the building. She was seen late last night after being found intoxicated in an elevator. Was observed until sober and was handed for this morning. She is checked back in stating she is "too sick". She describes nausea and shakes and feels that she is going into withdrawal's demanding "inpatient rehabilitation".  HPI  Past Medical History:  Diagnosis Date  . PTSD (post-traumatic stress disorder)     There are no active problems to display for this patient.   History reviewed. No pertinent surgical history.  OB History    No data available       Home Medications    Prior to Admission medications   Medication Sig Start Date End Date Taking? Authorizing Provider  chlordiazePOXIDE (LIBRIUM) 25 MG capsule 50mg  PO TID x 1D, then 25-50mg  PO BID X 1D, then 25-50mg  PO QD X 1D 09/23/16   Rolland PorterMark Yenifer Saccente, MD  GABAPENTIN PO Take by mouth.    Historical Provider, MD  QUEtiapine Fumarate (SEROQUEL PO) Take by mouth.    Historical Provider, MD    Family History History reviewed. No pertinent family history.  Social History Social History  Substance Use Topics  . Smoking status: Current Every Day Smoker  . Smokeless tobacco: Never Used  . Alcohol use Yes     Allergies   Morphine and related   Review of Systems Review of Systems  Constitutional: Negative for appetite change, chills, diaphoresis, fatigue and fever.  HENT: Negative for mouth sores, sore throat and trouble swallowing.   Eyes: Negative for visual disturbance.  Respiratory: Negative for cough, chest tightness, shortness of breath and wheezing.   Cardiovascular: Negative for chest pain.  Gastrointestinal: Positive for nausea.  Negative for abdominal distention, abdominal pain, diarrhea and vomiting.       "Dry heaving" patient. No emesis reported by staff.  Endocrine: Negative for polydipsia, polyphagia and polyuria.  Genitourinary: Negative for dysuria, frequency and hematuria.  Musculoskeletal: Negative for gait problem.  Skin: Negative for color change, pallor and rash.  Neurological: Positive for tremors. Negative for dizziness, syncope, light-headedness and headaches.  Hematological: Does not bruise/bleed easily.  Psychiatric/Behavioral: Negative for behavioral problems and confusion.     Physical Exam Updated Vital Signs BP 127/89   Pulse 86   Temp 97.8 F (36.6 C) (Oral)   Resp 18   LMP 08/24/2016   SpO2 98%   Physical Exam  Constitutional: She is oriented to person, place, and time. She appears well-developed and well-nourished. No distress.  Patient awake. Alert. Oriented lucid. She is ambulatory. She is not showing resting tremor.  HENT:  Head: Normocephalic.  Eyes: Conjunctivae are normal. Pupils are equal, round, and reactive to light. No scleral icterus.  Neck: Normal range of motion. Neck supple. No thyromegaly present.  Cardiovascular: Normal rate and regular rhythm.  Exam reveals no gallop and no friction rub.   No murmur heard. Pulmonary/Chest: Effort normal and breath sounds normal. No respiratory distress. She has no wheezes. She has no rales.  Abdominal: Soft. Bowel sounds are normal. She exhibits no distension. There is no tenderness. There is no rebound.  Musculoskeletal: Normal range of motion.  Neurological: She is alert and oriented to  person, place, and time.  Skin: Skin is warm and dry. No rash noted.  Psychiatric: She has a normal mood and affect. Her behavior is normal.     ED Treatments / Results  Labs (all labs ordered are listed, but only abnormal results are displayed) Labs Reviewed - No data to display  EKG  EKG Interpretation None       Radiology Ct  Head Wo Contrast  Result Date: 09/22/2016 CLINICAL DATA:  Initial valuation for acute headache, possible fall. EXAM: CT HEAD WITHOUT CONTRAST TECHNIQUE: Contiguous axial images were obtained from the base of the skull through the vertex without intravenous contrast. COMPARISON:  None. FINDINGS: Brain: Mildly advanced cerebral atrophy for patient age. No acute intracranial hemorrhage. No evidence for acute large vessel territory infarct. No mass lesion, midline shift or mass effect. No hydrocephalus. No extra-axial fluid collection. Vascular: No hyperdense vessel. Skull: Scalp soft tissues demonstrate no acute abnormality. Calvarium intact. Sinuses/Orbits: The globes error soft tissues within normal limits. Visualized paranasal sinuses and mastoid air cells are clear. IMPRESSION: 1. No acute intracranial process identified. 2. Mild generalized cerebral atrophy. Electronically Signed   By: Rise MuBenjamin  McClintock M.D.   On: 09/22/2016 23:58    Procedures Procedures (including critical care time)  Medications Ordered in ED Medications - No data to display   Initial Impression / Assessment and Plan / ED Course  I have reviewed the triage vital signs and the nursing notes.  Pertinent labs & imaging results that were available during my care of the patient were reviewed by me and considered in my medical decision making (see chart for details).  Clinical Course     Brief discussion with patient about utilizing outpatient resources given to her to locate outpatient rehabilitation. He will be given Zofran and Ativan here. Prescription for tapering Librium for alcohol withdrawal.  Final Clinical Impressions(s) / ED Diagnoses   Final diagnoses:  Alcoholic intoxication with complication (HCC)  Alcohol withdrawal syndrome without complication (HCC)    New Prescriptions New Prescriptions   CHLORDIAZEPOXIDE (LIBRIUM) 25 MG CAPSULE    50mg  PO TID x 1D, then 25-50mg  PO BID X 1D, then 25-50mg  PO QD X 1D       Rolland PorterMark Townsend Cudworth, MD 09/23/16 [phone]

## 2016-09-25 ENCOUNTER — Encounter (HOSPITAL_COMMUNITY): Payer: Self-pay | Admitting: Emergency Medicine

## 2016-09-25 ENCOUNTER — Emergency Department (HOSPITAL_COMMUNITY)
Admission: EM | Admit: 2016-09-25 | Discharge: 2016-09-26 | Disposition: A | Discharge status: Home / Self Care | Payer: Medicaid - Out of State | Attending: Emergency Medicine | Admitting: Emergency Medicine

## 2016-09-25 DIAGNOSIS — F17200 Nicotine dependence, unspecified, uncomplicated: Secondary | ICD-10-CM | POA: Insufficient documentation

## 2016-09-25 DIAGNOSIS — F1010 Alcohol abuse, uncomplicated: Secondary | ICD-10-CM | POA: Insufficient documentation

## 2016-09-25 DIAGNOSIS — F1020 Alcohol dependence, uncomplicated: Secondary | ICD-10-CM | POA: Diagnosis present

## 2016-09-25 DIAGNOSIS — F324 Major depressive disorder, single episode, in partial remission: Secondary | ICD-10-CM | POA: Diagnosis present

## 2016-09-25 DIAGNOSIS — F329 Major depressive disorder, single episode, unspecified: Secondary | ICD-10-CM | POA: Diagnosis present

## 2016-09-25 DIAGNOSIS — Z79899 Other long term (current) drug therapy: Secondary | ICD-10-CM | POA: Insufficient documentation

## 2016-09-25 LAB — RAPID URINE DRUG SCREEN, HOSP PERFORMED
Amphetamines: NOT DETECTED
BARBITURATES: NOT DETECTED
Benzodiazepines: NOT DETECTED
Cocaine: NOT DETECTED
Opiates: NOT DETECTED
Tetrahydrocannabinol: NOT DETECTED

## 2016-09-25 LAB — COMPREHENSIVE METABOLIC PANEL
ALBUMIN: 4.8 g/dL (ref 3.5–5.0)
ALT: 31 U/L (ref 14–54)
AST: 42 U/L — AB (ref 15–41)
Alkaline Phosphatase: 89 U/L (ref 38–126)
Anion gap: 14 (ref 5–15)
BUN: 7 mg/dL (ref 6–20)
CHLORIDE: 105 mmol/L (ref 101–111)
CO2: 23 mmol/L (ref 22–32)
CREATININE: 0.56 mg/dL (ref 0.44–1.00)
Calcium: 8.8 mg/dL — ABNORMAL LOW (ref 8.9–10.3)
GFR calc Af Amer: >60 mL/min (ref >60)
GLUCOSE: 101 mg/dL — AB (ref 65–99)
Potassium: 3.9 mmol/L (ref 3.5–5.1)
SODIUM: 142 mmol/L (ref 135–145)
Total Bilirubin: 0.3 mg/dL (ref 0.3–1.2)
Total Protein: 7.9 g/dL (ref 6.5–8.1)

## 2016-09-25 LAB — CBC WITH DIFFERENTIAL/PLATELET
Basophils Absolute: 0.1 10*3/uL (ref 0.0–0.1)
Basophils Relative: 1 %
EOS PCT: 3 %
Eosinophils Absolute: 0.3 10*3/uL (ref 0.0–0.7)
HEMATOCRIT: 37.7 % (ref 36.0–46.0)
Hemoglobin: 12.8 g/dL (ref 12.0–15.0)
LYMPHS ABS: 4.5 10*3/uL — AB (ref 0.7–4.0)
LYMPHS PCT: 43 %
MCH: 26.7 pg (ref 26.0–34.0)
MCHC: 34 g/dL (ref 30.0–36.0)
MCV: 78.5 fL (ref 78.0–100.0)
MONO ABS: 0.7 10*3/uL (ref 0.1–1.0)
MONOS PCT: 6 %
Neutro Abs: 4.8 10*3/uL (ref 1.7–7.7)
Neutrophils Relative %: 47 %
PLATELETS: 454 10*3/uL — AB (ref 150–400)
RBC: 4.8 MIL/uL (ref 3.87–5.11)
RDW: 22.1 % — AB (ref 11.5–15.5)
WBC: 10.3 10*3/uL (ref 4.0–10.5)

## 2016-09-25 LAB — URINALYSIS, ROUTINE W REFLEX MICROSCOPIC
Bilirubin Urine: NEGATIVE
GLUCOSE, UA: NEGATIVE mg/dL
Ketones, ur: NEGATIVE mg/dL
NITRITE: NEGATIVE
PROTEIN: NEGATIVE mg/dL
SPECIFIC GRAVITY, URINE: 1.006 (ref 1.005–1.030)
pH: 6 (ref 5.0–8.0)

## 2016-09-25 LAB — PREGNANCY, URINE: Preg Test, Ur: NEGATIVE

## 2016-09-25 LAB — ETHANOL: Alcohol, Ethyl (B): 328 mg/dL (ref <5)

## 2016-09-25 LAB — ACETAMINOPHEN LEVEL

## 2016-09-25 LAB — SALICYLATE LEVEL

## 2016-09-25 MED ORDER — LORAZEPAM 1 MG PO TABS
1.0000 mg | ORAL_TABLET | Freq: Once | ORAL | Status: AC
  Administered 2016-09-25: 1 mg via ORAL
  Filled 2016-09-25: qty 1

## 2016-09-25 MED ORDER — ONDANSETRON 4 MG PO TBDP
4.0000 mg | ORAL_TABLET | Freq: Three times a day (TID) | ORAL | Status: DC | PRN
  Administered 2016-09-25 – 2016-09-26 (×2): 4 mg via ORAL
  Filled 2016-09-25 (×2): qty 1
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
## 2016-09-25 NOTE — ED Notes (Signed)
Bed: WA33 Expected date:  Expected time:  Means of arrival:  Comments: 
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
## 2016-09-26 DIAGNOSIS — F1020 Alcohol dependence, uncomplicated: Secondary | ICD-10-CM | POA: Diagnosis present

## 2016-09-26 DIAGNOSIS — F329 Major depressive disorder, single episode, unspecified: Secondary | ICD-10-CM | POA: Diagnosis present

## 2016-09-26 DIAGNOSIS — F324 Major depressive disorder, single episode, in partial remission: Secondary | ICD-10-CM | POA: Diagnosis present

## 2016-09-26 LAB — ETHANOL: ALCOHOL ETHYL (B): 97 mg/dL — AB (ref <5)

## 2016-09-26 MED ORDER — WHITE PETROLATUM GEL
Status: AC
  Filled 2016-09-26: qty 5

## 2016-09-26 MED ORDER — LORAZEPAM 1 MG PO TABS: 1.0000 mg | ORAL_TABLET | Freq: Four times a day (QID) | ORAL | Status: DC | PRN

## 2016-09-26 MED ORDER — ALUM & MAG HYDROXIDE-SIMETH 200-200-20 MG/5ML PO SUSP: 30.0000 mL | ORAL | Status: DC | PRN

## 2016-09-26 MED ORDER — LORAZEPAM 1 MG PO TABS
2.0000 mg | ORAL_TABLET | Freq: Once | ORAL | Status: AC
  Administered 2016-09-26: 2 mg via ORAL
  Filled 2016-09-26: qty 2

## 2016-09-26 MED ORDER — VITAMIN B-1 100 MG PO TABS
100.0000 mg | ORAL_TABLET | Freq: Every day | ORAL | Status: DC
  Administered 2016-09-26: 100 mg via ORAL
  Filled 2016-09-26: qty 1

## 2016-09-26 MED ORDER — THIAMINE HCL 100 MG/ML IJ SOLN: 100.0000 mg | Freq: Every day | INTRAMUSCULAR | Status: DC

## 2016-09-26 MED ORDER — IBUPROFEN 200 MG PO TABS: 600.0000 mg | ORAL_TABLET | Freq: Three times a day (TID) | ORAL | Status: DC | PRN

## 2016-09-26 MED ORDER — ADULT MULTIVITAMIN W/MINERALS CH
1.0000 | ORAL_TABLET | Freq: Every day | ORAL | Status: DC
  Administered 2016-09-26: 1 via ORAL
  Filled 2016-09-26: qty 1

## 2016-09-26 MED ORDER — LORAZEPAM 2 MG/ML IJ SOLN: 1.0000 mg | Freq: Four times a day (QID) | INTRAMUSCULAR | Status: DC | PRN

## 2016-09-26 MED ORDER — LORAZEPAM 1 MG PO TABS
0.0000 mg | ORAL_TABLET | Freq: Four times a day (QID) | ORAL | Status: DC
Start: 2016-09-26 — End: 2016-09-26
  Administered 2016-09-26: 2 mg via ORAL
  Filled 2016-09-26: qty 2

## 2016-09-26 MED ORDER — FOLIC ACID 1 MG PO TABS
1.0000 mg | ORAL_TABLET | Freq: Every day | ORAL | Status: DC
  Administered 2016-09-26: 1 mg via ORAL
  Filled 2016-09-26: qty 1

## 2016-09-26 MED ORDER — FLUOXETINE HCL 10 MG PO CAPS: 10.0000 mg | ORAL_CAPSULE | Freq: Every day | ORAL | Status: DC

## 2016-09-26 MED ORDER — ONDANSETRON HCL 4 MG PO TABS: 4.0000 mg | ORAL_TABLET | Freq: Three times a day (TID) | ORAL | Status: DC | PRN

## 2016-09-26 MED ORDER — LORAZEPAM 1 MG PO TABS: 0.0000 mg | ORAL_TABLET | Freq: Two times a day (BID) | ORAL | Status: DC

## 2016-09-26 NOTE — ED Provider Notes (Signed)
Blood pressure 135/80, pulse 83, temperature 98.5 F (36.9 C), temperature source Oral, resp. rate 16, height 5\' 6"  (1.676 m), weight 142 lb (64.4 kg), last menstrual period 08/24/2016, SpO2 99 %.   In short, Mary Dodson is a 50 y.o. female with a chief complaint of Suicidal .  Refer to the original H&P for additional details.  The current plan of care is to follow along as patient is medically clear.   12:42 PM Called by behavioral health team who recommended inpatient admission. The patient does not want to stay. The behavioral health team reevaluated the patient along with myself. I did not believe she meets criteria for IVC at this time. She is awake and alert. She seems appropriate. The behavioral health team provided resources at discharge. Plan to allow the patient to leave.  Alona BeneJoshua Earnestine Tuohey, MD    [name] G [name], MD 09/26/16 [phone]

## 2016-09-26 NOTE — Progress Notes (Addendum)
[place]cp.  CM provided written information to assist pt with determining choice for uninsured accepting pcps, discussed the importance of pcp vs EDP services for f/u care, www.needymeds.org, www.goodrx.com, discounted pharmacies and other [place] resources such as [facility] , [facility], affordable care act, financial assistance, uninsured dental services, [place] med assist, DSS and  health department  Reviewed resources for [place] uninsured accepting pcps like [facility], [facility] medicine [facility] [address], [facility], [facility], local urgent care centers, [facility], [facility], general medical clinics, [facility] services of [facility], [facility] urgent care plus others, medication resources, [facility] out patient pharmacies and housing Provided [facility] contact information

## 2016-09-26 NOTE — Progress Notes (Signed)
Entered in d/c instructions Please use the resources provided to you by the ED Case manager to find follow up medical care      If you plan to reside in [place] please contact the local DSS office related to updating medicaid services  address [address] [place]  [phone]   

## 2016-09-26 NOTE — BH Assessment (Signed)
[facility] Assessment Progress Note  Per Thedore MinsMojeed Akintayo, MD, this pt [facility].  Later, pt's nurse, Verlon AuLeslie, calls reporting that [facility] is [facility].  Pt is currently under voluntary status.  This Clinical research associatewriter then called Nanine MeansJamison Lord, DNP, asking if pt meets criteria for IVC.  She opines that pt does not meet IVC criteria.  I then spoke to EDP Dr Jacqulyn BathLong, reporting Jamison's opinion, with which he concurs.  He agrees to submit order for discharge.  Discharge instructions provide pt with referral information for [facility] and for [facility]; pt is advised to follow up with them at her earliest opportunity for substance abuse treatment.  Pt's nurse, Verlon AuLeslie, has been notified.  Doylene Canninghomas Thanh Pomerleau, MA Triage Specialist [phone]

## 2016-09-26 NOTE — ED Notes (Signed)
Patient is alert and oriented x3.  She was given DC instructions and follow up visit instructions.  Patient gave verbal understanding. She was DC ambulatory under her own power to home.  V/S stable.  He was not showing any signs of distress on DC 

## 2016-09-26 NOTE — Discharge Instructions (Signed)
To help you maintain a sober lifestyle, a substance abuse treatment program may be beneficial to you.  Contact one of the following facilities at your earliest opportunity to ask about enrolling:  RESIDENTIAL PROGRAMS:       [facility]      [address]      [place], [place] [address]      [phone]  OUTPATIENT PROGRAMS:       [facility]      [address]      [place], [place] [address]      [phone]      New patients are seen at the walk-in clinic every Tuesday from 9:00 am - 12:00 pm.

## 2016-09-26 NOTE — ED Notes (Signed)
[facility] to look over notes to accept patient
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
## 2016-09-26 NOTE — BH Assessment (Signed)
Notified Melinda, RN of disposition decision that pt meets inpatient criteriJuliette Alcidea per Donell SievertSpencer Simon, NP. Clint [facility]olderori Beck, [facility] but will be able to admit pt during the day on 12/19 once beds become available.   Orlie PollenAshley Emiley Digiacomo, LPC, NCC,  LCAS-A Therapeutic Triage Specialist  09/26/2016 3:09 AM

## 2016-09-30 ENCOUNTER — Emergency Department (HOSPITAL_COMMUNITY)
Admission: EM | Admit: 2016-09-30 | Discharge: 2016-09-30 | Disposition: A | Discharge status: Home / Self Care | Payer: Medicaid - Out of State | Attending: Emergency Medicine | Admitting: Emergency Medicine

## 2016-09-30 ENCOUNTER — Encounter (HOSPITAL_COMMUNITY): Payer: Self-pay | Admitting: Emergency Medicine

## 2016-09-30 DIAGNOSIS — Z79899 Other long term (current) drug therapy: Secondary | ICD-10-CM | POA: Insufficient documentation

## 2016-09-30 DIAGNOSIS — F17200 Nicotine dependence, unspecified, uncomplicated: Secondary | ICD-10-CM | POA: Insufficient documentation

## 2016-09-30 DIAGNOSIS — F1010 Alcohol abuse, uncomplicated: Secondary | ICD-10-CM

## 2016-09-30 MED ORDER — CHLORDIAZEPOXIDE HCL 25 MG PO CAPS
25.0000 mg | ORAL_CAPSULE | Freq: Once | ORAL | Status: AC
  Administered 2016-09-30: 25 mg via ORAL
  Filled 2016-09-30: qty 1

## 2016-09-30 MED ORDER — CHLORDIAZEPOXIDE HCL 25 MG PO CAPS: ORAL_CAPSULE | ORAL | 0 refills | Status: DC

## 2016-09-30 MED ORDER — LORAZEPAM 1 MG PO TABS
1.0000 mg | ORAL_TABLET | Freq: Once | ORAL | Status: AC
  Administered 2016-09-30: 1 mg via ORAL
  Filled 2016-09-30: qty 1

## 2016-09-30 MED ORDER — ONDANSETRON 4 MG PO TBDP
4.0000 mg | ORAL_TABLET | Freq: Once | ORAL | Status: AC
  Administered 2016-09-30: 4 mg via ORAL
  Filled 2016-09-30: qty 1
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
## 2016-09-30 NOTE — ED Provider Notes (Signed)
[facility]-EMERGENCY DEPT Provider Note   CSN: [id number] Arrival date & time: 09/30/16  0141     History   Chief Complaint Chief Complaint  Patient presents with  . Alcohol Intoxication    HPI Mary Dodson is a 50 y.o. female.  The history is provided by the patient. No language interpreter was used.  Alcohol Intoxication    Mary Dodson is a 50 y.o. female who presents to the Emergency Department complaining of alcohol abuse.  She has a history of alcohol abuse and comes in seeking treatment. She was recently seen in the emergency department for alcohol intoxication and inpatient treatment was offered and she declined. She states that she currently wants help for her drinking and wants assistance in preventing withdrawals until then. She denies any current SI or HI. Her last drink was earlier today. Past Medical History:  Diagnosis Date  . PTSD (post-traumatic stress disorder)     Patient Active Problem List   Diagnosis Date Noted  . Alcohol use disorder, severe, dependence (HCC) 09/26/2016  . Major depressive disorder with single episode 09/26/2016    History reviewed. No pertinent surgical history.  OB History    No data available       Home Medications    Prior to Admission medications   Medication Sig Start Date End Date Taking? Authorizing Provider  chlordiazePOXIDE (LIBRIUM) 25 MG capsule 50mg  PO TID x 1D, then 25-50mg  PO BID X 1D, then 25-50mg  PO QD X 1D 09/30/16   Tilden FossaElizabeth Makih Stefanko, MD  ferrous fumarate (HEMOCYTE - 106 MG FE) 325 (106 Fe) MG TABS tablet Take 2 tablets by mouth daily.    Historical Provider, MD  GABAPENTIN PO Take by mouth.    Historical Provider, MD  pantoprazole (PROTONIX) 40 MG tablet Take 40 mg by mouth daily.    Historical Provider, MD  QUEtiapine Fumarate (SEROQUEL PO) Take by mouth.    Historical Provider, MD  sertraline (ZOLOFT) 100 MG tablet Take 100 mg by mouth daily.    Historical Provider, MD    Family History History reviewed. No  pertinent family history.  Social History Social History  Substance Use Topics  . Smoking status: Current Every Day Smoker  . Smokeless tobacco: Never Used  . Alcohol use Yes     Allergies   Morphine and related   Review of Systems Review of Systems  All other systems reviewed and are negative.    Physical Exam Updated Vital Signs BP (!) 137/44 (BP Location: Right Arm)   Pulse 105   Temp 97.7 F (36.5 C) (Oral)   Resp 20   Ht 5\' 6"  (1.676 m)   Wt 142 lb (64.4 kg)   LMP 08/24/2016   SpO2 96%   BMI 22.92 kg/m   Physical Exam  Constitutional: She is oriented to person, place, and time. She appears well-developed and well-nourished.  HENT:  Head: Normocephalic and atraumatic.  Cardiovascular: Normal rate and regular rhythm.   Pulmonary/Chest: Effort normal. No respiratory distress.  Musculoskeletal: Normal range of motion.  Neurological: She is alert and oriented to person, place, and time.  Skin: Skin is warm.  Psychiatric:  Denies SI, HI. Mildly anxious.  Nursing note and vitals reviewed.    ED Treatments / Results  Labs (all labs ordered are listed, but only abnormal results are displayed) Labs Reviewed - No data to display  EKG  EKG Interpretation None       Radiology No results found.  Procedures Procedures (including critical care time)  Medications Ordered in ED Medications  ondansetron (ZOFRAN-ODT) disintegrating tablet 4 mg (4 mg Oral Given 09/30/16 0445)  chlordiazePOXIDE (LIBRIUM) capsule 25 mg (25 mg Oral Given 09/30/16 0445)  LORazepam (ATIVAN) tablet 1 mg (1 mg Oral Given 09/30/16 0445)     Initial Impression / Assessment and Plan / ED Course  I have reviewed the triage vital signs and the nursing notes.  Pertinent labs & imaging results that were available during my care of the patient were reviewed by me and considered in my medical decision making (see chart for details).  Clinical Course    Patient with history of EtOH  abuse here seeking assistance with going through withdrawals at home requesting Librium taper. She is not suicidal or homicidal. Plan to DC home with outpatient follow-up. Home care and return precautions discussed.   Final Clinical Impressions(s) / ED Diagnoses   Final diagnoses:  Alcohol abuse    New Prescriptions New Prescriptions   CHLORDIAZEPOXIDE (LIBRIUM) 25 MG CAPSULE    50mg  PO TID x 1D, then 25-50mg  PO BID X 1D, then 25-50mg  PO QD X 1D     Tilden FossaElizabeth Willim Turnage, MD 09/30/16 [phone]

## 2016-10-01 ENCOUNTER — Emergency Department (HOSPITAL_COMMUNITY): Payer: Medicaid - Out of State

## 2016-10-01 ENCOUNTER — Emergency Department (HOSPITAL_COMMUNITY)
Admission: EM | Admit: 2016-10-01 | Discharge: 2016-10-01 | Disposition: A | Discharge status: Home / Self Care | Payer: Medicaid - Out of State | Attending: Emergency Medicine | Admitting: Emergency Medicine

## 2016-10-01 ENCOUNTER — Encounter (HOSPITAL_COMMUNITY): Payer: Self-pay | Admitting: *Deleted

## 2016-10-01 ENCOUNTER — Emergency Department (HOSPITAL_COMMUNITY)
Admission: EM | Admit: 2016-10-01 | Discharge: 2016-10-02 | Disposition: A | Discharge status: Other Acute Inpatient Hospital | Payer: Federal, State, Local not specified - Other | Attending: Emergency Medicine | Admitting: Emergency Medicine

## 2016-10-01 DIAGNOSIS — R935 Abnormal findings on diagnostic imaging of other abdominal regions, including retroperitoneum: Secondary | ICD-10-CM | POA: Insufficient documentation

## 2016-10-01 DIAGNOSIS — R931 Abnormal findings on diagnostic imaging of heart and coronary circulation: Secondary | ICD-10-CM | POA: Insufficient documentation

## 2016-10-01 DIAGNOSIS — Z79899 Other long term (current) drug therapy: Secondary | ICD-10-CM | POA: Insufficient documentation

## 2016-10-01 DIAGNOSIS — F17200 Nicotine dependence, unspecified, uncomplicated: Secondary | ICD-10-CM | POA: Insufficient documentation

## 2016-10-01 DIAGNOSIS — S92342A Displaced fracture of fourth metatarsal bone, left foot, initial encounter for closed fracture: Secondary | ICD-10-CM | POA: Insufficient documentation

## 2016-10-01 DIAGNOSIS — F332 Major depressive disorder, recurrent severe without psychotic features: Secondary | ICD-10-CM | POA: Diagnosis present

## 2016-10-01 DIAGNOSIS — Y999 Unspecified external cause status: Secondary | ICD-10-CM | POA: Insufficient documentation

## 2016-10-01 DIAGNOSIS — R51 Headache: Secondary | ICD-10-CM | POA: Insufficient documentation

## 2016-10-01 DIAGNOSIS — F1010 Alcohol abuse, uncomplicated: Secondary | ICD-10-CM

## 2016-10-01 DIAGNOSIS — F10929 Alcohol use, unspecified with intoxication, unspecified: Secondary | ICD-10-CM

## 2016-10-01 DIAGNOSIS — Y92410 Unspecified street and highway as the place of occurrence of the external cause: Secondary | ICD-10-CM | POA: Insufficient documentation

## 2016-10-01 DIAGNOSIS — Y9389 Activity, other specified: Secondary | ICD-10-CM | POA: Insufficient documentation

## 2016-10-01 DIAGNOSIS — F10129 Alcohol abuse with intoxication, unspecified: Secondary | ICD-10-CM | POA: Insufficient documentation

## 2016-10-01 LAB — CBC WITH DIFFERENTIAL/PLATELET
BASOS ABS: 0.1 10*3/uL (ref 0.0–0.1)
Basophils Relative: 1 %
Eosinophils Absolute: 0.1 10*3/uL (ref 0.0–0.7)
Eosinophils Relative: 1 %
HEMATOCRIT: 40.6 % (ref 36.0–46.0)
Hemoglobin: 13.8 g/dL (ref 12.0–15.0)
LYMPHS ABS: 1.2 10*3/uL (ref 0.7–4.0)
LYMPHS PCT: 19 %
MCH: 27.5 pg (ref 26.0–34.0)
MCHC: 34 g/dL (ref 30.0–36.0)
MCV: 80.9 fL (ref 78.0–100.0)
MONOS PCT: 8 %
Monocytes Absolute: 0.5 10*3/uL (ref 0.1–1.0)
Neutro Abs: 4.2 10*3/uL (ref 1.7–7.7)
Neutrophils Relative %: 71 %
Platelets: 270 10*3/uL (ref 150–400)
RBC: 5.02 MIL/uL (ref 3.87–5.11)
RDW: 22.6 % — AB (ref 11.5–15.5)
WBC: 6.1 10*3/uL (ref 4.0–10.5)

## 2016-10-01 LAB — URINALYSIS, ROUTINE W REFLEX MICROSCOPIC
Bilirubin Urine: NEGATIVE
GLUCOSE, UA: NEGATIVE mg/dL
Ketones, ur: NEGATIVE mg/dL
Nitrite: NEGATIVE
PH: 6 (ref 5.0–8.0)
Protein, ur: NEGATIVE mg/dL
SPECIFIC GRAVITY, URINE: 1.006 (ref 1.005–1.030)

## 2016-10-01 LAB — COMPREHENSIVE METABOLIC PANEL
ALBUMIN: 4.2 g/dL (ref 3.5–5.0)
ALT: 33 U/L (ref 14–54)
AST: 67 U/L — AB (ref 15–41)
Alkaline Phosphatase: 87 U/L (ref 38–126)
Anion gap: 14 (ref 5–15)
BILIRUBIN TOTAL: 0.2 mg/dL — AB (ref 0.3–1.2)
BUN: 11 mg/dL (ref 6–20)
CHLORIDE: 96 mmol/L — AB (ref 101–111)
CO2: 21 mmol/L — AB (ref 22–32)
Calcium: 9.1 mg/dL (ref 8.9–10.3)
Creatinine, Ser: 0.6 mg/dL (ref 0.44–1.00)
GFR calc Af Amer: >60 mL/min (ref >60)
GFR calc non Af Amer: >60 mL/min (ref >60)
GLUCOSE: 148 mg/dL — AB (ref 65–99)
POTASSIUM: 4.2 mmol/L (ref 3.5–5.1)
Sodium: 131 mmol/L — ABNORMAL LOW (ref 135–145)
TOTAL PROTEIN: 7.3 g/dL (ref 6.5–8.1)

## 2016-10-01 LAB — ETHANOL: Alcohol, Ethyl (B): 266 mg/dL — ABNORMAL HIGH (ref <5)

## 2016-10-01 LAB — RAPID URINE DRUG SCREEN, HOSP PERFORMED
AMPHETAMINES: NOT DETECTED
BARBITURATES: NOT DETECTED
BENZODIAZEPINES: POSITIVE — AB
Cocaine: NOT DETECTED
Opiates: NOT DETECTED
TETRAHYDROCANNABINOL: NOT DETECTED

## 2016-10-01 LAB — I-STAT CG4 LACTIC ACID, ED
LACTIC ACID, VENOUS: 3.66 mmol/L — AB (ref 0.5–1.9)
Lactic Acid, Venous: 3.59 mmol/L (ref 0.5–1.9)

## 2016-10-01 MED ORDER — SODIUM CHLORIDE 0.9 % IV BOLUS (SEPSIS)
1000.0000 mL | Freq: Once | INTRAVENOUS | Status: AC
  Administered 2016-10-01: 1000 mL via INTRAVENOUS

## 2016-10-01 MED ORDER — ACETAMINOPHEN 325 MG PO TABS
650.0000 mg | ORAL_TABLET | Freq: Once | ORAL | Status: AC
Start: 2016-10-01 — End: 2016-10-01
  Administered 2016-10-01: 650 mg via ORAL
  Filled 2016-10-01: qty 2

## 2016-10-01 MED ORDER — SODIUM CHLORIDE 0.9 % IV SOLN
Freq: Once | INTRAVENOUS | Status: AC
  Administered 2016-10-02: via INTRAVENOUS

## 2016-10-01 MED ORDER — VITAMIN B-1 100 MG PO TABS: 100.0000 mg | ORAL_TABLET | Freq: Every day | ORAL | 0 refills | Status: DC

## 2016-10-01 MED ORDER — CHLORDIAZEPOXIDE HCL 5 MG PO CAPS
5.0000 mg | ORAL_CAPSULE | Freq: Four times a day (QID) | ORAL | Status: DC | PRN
  Administered 2016-10-02 (×2): 5 mg via ORAL
  Filled 2016-10-01 (×2): qty 1

## 2016-10-01 NOTE — ED Provider Notes (Signed)
[facility] DEPT Provider Note   CSN: [id number] Arrival date & time: 10/01/16  1337     History   Chief Complaint Chief Complaint  Patient presents with  . Alcohol Intoxication    HPI Mary Dodson is a 50 y.o. female.  50 year old female with history of chronic alcoholic addiction has been seen here multiple times for similar symptoms here. She is requesting help with oncology addiction and was seen here yesterday for similar symptoms and given prescription for Librium which did not fill but she did buy more alcohol. Called EMS and was transported here. She denies any suicidal or homicidal ideations.      Past Medical History:  Diagnosis Date  . PTSD (post-traumatic stress disorder)     Patient Active Problem List   Diagnosis Date Noted  . Alcohol use disorder, severe, dependence (HCC) 09/26/2016  . Major depressive disorder with single episode 09/26/2016    No past surgical history on file.  OB History    No data available       Home Medications    Prior to Admission medications   Medication Sig Start Date End Date Taking? Authorizing Provider  chlordiazePOXIDE (LIBRIUM) 25 MG capsule 50mg  PO TID x 1D, then 25-50mg  PO BID X 1D, then 25-50mg  PO QD X 1D 09/30/16   Tilden FossaElizabeth Rees, MD  ferrous fumarate (HEMOCYTE - 106 MG FE) 325 (106 Fe) MG TABS tablet Take 2 tablets by mouth daily.    Historical Provider, MD  GABAPENTIN PO Take by mouth.    Historical Provider, MD  pantoprazole (PROTONIX) 40 MG tablet Take 40 mg by mouth daily.    Historical Provider, MD  QUEtiapine Fumarate (SEROQUEL PO) Take by mouth.    Historical Provider, MD  sertraline (ZOLOFT) 100 MG tablet Take 100 mg by mouth daily.    Historical Provider, MD    Family History No family history on file.  Social History Social History  Substance Use Topics  . Smoking status: Current Every Day Smoker  . Smokeless tobacco: Never Used  . Alcohol use Yes     Allergies   Morphine and  related   Review of Systems Review of Systems  Unable to perform ROS: Acuity of condition     Physical Exam Updated Vital Signs BP 116/71 (BP Location: Right Arm)   Pulse 92   Temp 97.8 F (36.6 C) (Oral)   Resp 18   LMP 08/24/2016   SpO2 96%   Physical Exam  Constitutional: She is oriented to person, place, and time. She appears well-developed and well-nourished.  Non-toxic appearance. No distress.  HENT:  Head: Normocephalic and atraumatic.  Eyes: Conjunctivae, EOM and lids are normal. Pupils are equal, round, and reactive to light.  Neck: Normal range of motion. Neck supple. No tracheal deviation present. No thyroid mass present.  Cardiovascular: Normal rate, regular rhythm and normal heart sounds.  Exam reveals no gallop.   No murmur heard. Pulmonary/Chest: Effort normal and breath sounds normal. No stridor. No respiratory distress. She has no decreased breath sounds. She has no wheezes. She has no rhonchi. She has no rales.  Abdominal: Soft. Normal appearance and bowel sounds are normal. She exhibits no distension. There is no tenderness. There is no rebound and no CVA tenderness.  Musculoskeletal: Normal range of motion. She exhibits no edema or tenderness.  Neurological: She is alert and oriented to person, place, and time. She has normal strength. No cranial nerve deficit or sensory deficit. GCS eye subscore is 4. GCS  verbal subscore is 5. GCS motor subscore is 6.  Skin: Skin is warm and dry. No abrasion and no rash noted.  Psychiatric: Her affect is labile. Her speech is slurred. She is agitated and aggressive. She expresses no suicidal plans and no homicidal plans.  Nursing note and vitals reviewed.    ED Treatments / Results  Labs (all labs ordered are listed, but only abnormal results are displayed) Labs Reviewed - No data to display  EKG  EKG Interpretation None       Radiology No results found.  Procedures Procedures (including critical care  time)  Medications Ordered in ED Medications - No data to display   Initial Impression / Assessment and Plan / ED Course  I have reviewed the triage vital signs and the nursing notes.  Pertinent labs & imaging results that were available during my care of the patient were reviewed by me and considered in my medical decision making (see chart for details).  Clinical Course     Patient will be allowed to become clinically sober and then discharged with referrals  Final Clinical Impressions(s) / ED Diagnoses   Final diagnoses:  None    New Prescriptions New Prescriptions   No medications on file     Lorre NickAnthony Divit Stipp, MD 10/01/16 1411
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
## 2016-10-01 NOTE — ED Triage Notes (Signed)
Per EMS, pt is from home and called saying she was "scared of going through withdrawals". Pt c/o of headache and stated she felt like she was dying. EMS reports that pt had just finished a beer when they arrived.

## 2016-10-01 NOTE — ED Provider Notes (Signed)
Assumed care from Dr. Freida BusmanAllen. Pt now clinically sober and ambulatory, requesting d/c so she can catch the bus. On my exam, pt alert, ambulatory. No tremors, VSS, and no signs of w/d though she is repeatedly asking for ativan. Per review of records, pt has chronic ETOH abuse and I am hesitant to place her on benzos as she expresses no desire to quit. Given o/w stable labs, vitals, and now clinical sobriety, will d/c home with outpt referrals.   [name], MD 10/01/16 [phone]

## 2016-10-01 NOTE — Progress Notes (Signed)
Orthopedic Tech Progress Note Patient Details:  Mary Dodson January 14, 1966 [id number]  Ortho Devices Type of Ortho Device: CAM walker Ortho Device/Splint Location: lle Ortho Device/Splint Interventions: Ordered, Application   Trinna PostMartinez, [name] 10/01/2016, 11:06 PM

## 2016-10-01 NOTE — ED Notes (Signed)
Bed: [facility] Expected date:  Expected time:  Means of arrival:  Comments: ETOH 

## 2016-10-02 ENCOUNTER — Inpatient Hospital Stay (HOSPITAL_COMMUNITY)
Admission: AD | Admit: 2016-10-02 | Discharge: 2016-10-06 | DRG: 897 | Disposition: A | Discharge status: Home / Self Care | Payer: Federal, State, Local not specified - Other | Source: Intra-hospital | Attending: Psychiatry | Admitting: Psychiatry

## 2016-10-02 ENCOUNTER — Encounter (HOSPITAL_COMMUNITY): Payer: Self-pay | Admitting: Radiology

## 2016-10-02 ENCOUNTER — Emergency Department (HOSPITAL_COMMUNITY): Payer: Medicaid - Out of State

## 2016-10-02 DIAGNOSIS — Z79899 Other long term (current) drug therapy: Secondary | ICD-10-CM

## 2016-10-02 DIAGNOSIS — F17210 Nicotine dependence, cigarettes, uncomplicated: Secondary | ICD-10-CM | POA: Diagnosis not present

## 2016-10-02 DIAGNOSIS — F10229 Alcohol dependence with intoxication, unspecified: Secondary | ICD-10-CM | POA: Diagnosis present

## 2016-10-02 DIAGNOSIS — S92309D Fracture of unspecified metatarsal bone(s), unspecified foot, subsequent encounter for fracture with routine healing: Secondary | ICD-10-CM | POA: Diagnosis not present

## 2016-10-02 DIAGNOSIS — F312 Bipolar disorder, current episode manic severe with psychotic features: Secondary | ICD-10-CM | POA: Diagnosis not present

## 2016-10-02 DIAGNOSIS — F22 Delusional disorders: Secondary | ICD-10-CM | POA: Diagnosis present

## 2016-10-02 DIAGNOSIS — F1010 Alcohol abuse, uncomplicated: Secondary | ICD-10-CM | POA: Diagnosis not present

## 2016-10-02 DIAGNOSIS — F4310 Post-traumatic stress disorder, unspecified: Secondary | ICD-10-CM | POA: Diagnosis present

## 2016-10-02 DIAGNOSIS — F1020 Alcohol dependence, uncomplicated: Secondary | ICD-10-CM | POA: Clinically undetermined

## 2016-10-02 DIAGNOSIS — F1024 Alcohol dependence with alcohol-induced mood disorder: Secondary | ICD-10-CM | POA: Diagnosis present

## 2016-10-02 DIAGNOSIS — F17200 Nicotine dependence, unspecified, uncomplicated: Secondary | ICD-10-CM | POA: Diagnosis present

## 2016-10-02 DIAGNOSIS — F419 Anxiety disorder, unspecified: Secondary | ICD-10-CM | POA: Diagnosis present

## 2016-10-02 DIAGNOSIS — R45851 Suicidal ideations: Secondary | ICD-10-CM | POA: Diagnosis present

## 2016-10-02 DIAGNOSIS — F3181 Bipolar II disorder: Secondary | ICD-10-CM | POA: Diagnosis present

## 2016-10-02 DIAGNOSIS — Z885 Allergy status to narcotic agent status: Secondary | ICD-10-CM | POA: Diagnosis not present

## 2016-10-02 DIAGNOSIS — Y908 Blood alcohol level of 240 mg/100 ml or more: Secondary | ICD-10-CM | POA: Diagnosis present

## 2016-10-02 DIAGNOSIS — F332 Major depressive disorder, recurrent severe without psychotic features: Secondary | ICD-10-CM | POA: Diagnosis present

## 2016-10-02 DIAGNOSIS — F10239 Alcohol dependence with withdrawal, unspecified: Secondary | ICD-10-CM | POA: Diagnosis present

## 2016-10-02 DIAGNOSIS — Z888 Allergy status to other drugs, medicaments and biological substances status: Secondary | ICD-10-CM

## 2016-10-02 LAB — I-STAT CG4 LACTIC ACID, ED: LACTIC ACID, VENOUS: 1.64 mmol/L (ref 0.5–1.9)

## 2016-10-02 MED ORDER — IOPAMIDOL (ISOVUE-300) INJECTION 61%
INTRAVENOUS | Status: AC
  Administered 2016-10-02: 100 mL
  Filled 2016-10-02: qty 100

## 2016-10-02 MED ORDER — LORAZEPAM 1 MG PO TABS
0.0000 mg | ORAL_TABLET | Freq: Two times a day (BID) | ORAL | Status: DC
  Filled 2016-10-02: qty 1

## 2016-10-02 MED ORDER — LORAZEPAM 1 MG PO TABS
0.0000 mg | ORAL_TABLET | Freq: Four times a day (QID) | ORAL | Status: DC
  Administered 2016-10-02: 2 mg via ORAL
  Filled 2016-10-02: qty 2
  Filled 2016-10-02: qty 1

## 2016-10-02 MED ORDER — LORAZEPAM 1 MG PO TABS
0.0000 mg | ORAL_TABLET | Freq: Two times a day (BID) | ORAL | Status: DC
  Administered 2016-10-02: 1 mg via ORAL

## 2016-10-02 MED ORDER — ONDANSETRON 4 MG PO TBDP
4.0000 mg | ORAL_TABLET | Freq: Three times a day (TID) | ORAL | Status: DC | PRN
  Administered 2016-10-03: 4 mg via ORAL
  Filled 2016-10-02: qty 1

## 2016-10-02 MED ORDER — LORAZEPAM 1 MG PO TABS
0.0000 mg | ORAL_TABLET | Freq: Four times a day (QID) | ORAL | Status: DC
  Administered 2016-10-02 – 2016-10-03 (×2): 1 mg via ORAL
  Filled 2016-10-02: qty 1

## 2016-10-02 MED ORDER — PROMETHAZINE HCL 25 MG PO TABS
25.0000 mg | ORAL_TABLET | Freq: Once | ORAL | Status: AC
  Administered 2016-10-02: 25 mg via ORAL
  Filled 2016-10-02: qty 1

## 2016-10-02 MED ORDER — LORAZEPAM 1 MG PO TABS
2.0000 mg | ORAL_TABLET | Freq: Once | ORAL | Status: AC
  Administered 2016-10-02: 2 mg via ORAL
  Filled 2016-10-02: qty 2

## 2016-10-02 MED ORDER — LORAZEPAM 2 MG/ML IJ SOLN
1.0000 mg | Freq: Once | INTRAMUSCULAR | Status: AC
  Administered 2016-10-02: 1 mg via INTRAVENOUS
  Filled 2016-10-02: qty 1

## 2016-10-02 MED ORDER — ACETAMINOPHEN 325 MG PO TABS
650.0000 mg | ORAL_TABLET | Freq: Four times a day (QID) | ORAL | Status: DC | PRN
  Administered 2016-10-03 – 2016-10-05 (×4): 650 mg via ORAL
  Filled 2016-10-02 (×4): qty 2

## 2016-10-02 MED ORDER — MAGNESIUM HYDROXIDE 400 MG/5ML PO SUSP: 30.0000 mL | Freq: Every day | ORAL | Status: DC | PRN

## 2016-10-02 MED ORDER — ALUM & MAG HYDROXIDE-SIMETH 200-200-20 MG/5ML PO SUSP
30.0000 mL | ORAL | Status: DC | PRN
  Administered 2016-10-03 – 2016-10-05 (×2): 30 mL via ORAL
  Filled 2016-10-02 (×2): qty 30

## 2016-10-02 MED ORDER — ONDANSETRON HCL 4 MG/2ML IJ SOLN
4.0000 mg | Freq: Once | INTRAMUSCULAR | Status: AC
  Administered 2016-10-02: 4 mg via INTRAVENOUS
  Filled 2016-10-02: qty 2

## 2016-10-02 MED ORDER — ONDANSETRON 4 MG PO TBDP
4.0000 mg | ORAL_TABLET | Freq: Once | ORAL | Status: AC
  Administered 2016-10-02: 4 mg via ORAL
  Filled 2016-10-02: qty 1

## 2016-10-02 MED ORDER — QUETIAPINE FUMARATE 400 MG PO TABS
400.0000 mg | ORAL_TABLET | Freq: Every day | ORAL | Status: DC
  Administered 2016-10-02: 400 mg via ORAL
  Filled 2016-10-02: qty 2
  Filled 2016-10-02 (×2): qty 1

## 2016-10-02 MED ORDER — THIAMINE HCL 100 MG/ML IJ SOLN: 100.0000 mg | Freq: Every day | INTRAMUSCULAR | Status: DC

## 2016-10-02 MED ORDER — HYDROXYZINE HCL 25 MG PO TABS: 25.0000 mg | ORAL_TABLET | Freq: Four times a day (QID) | ORAL | Status: DC | PRN

## 2016-10-02 MED ORDER — VITAMIN B-1 100 MG PO TABS: 100.0000 mg | ORAL_TABLET | Freq: Every day | ORAL | Status: DC

## 2016-10-02 MED ORDER — THIAMINE HCL 100 MG/ML IJ SOLN
100.0000 mg | Freq: Every day | INTRAMUSCULAR | Status: DC
  Filled 2016-10-02: qty 2

## 2016-10-02 MED ORDER — TRAZODONE HCL 50 MG PO TABS
50.0000 mg | ORAL_TABLET | Freq: Every evening | ORAL | Status: DC | PRN
  Filled 2016-10-02: qty 1

## 2016-10-02 MED ORDER — GABAPENTIN 400 MG PO CAPS
800.0000 mg | ORAL_CAPSULE | Freq: Three times a day (TID) | ORAL | Status: DC
  Administered 2016-10-03 – 2016-10-05 (×7): 800 mg via ORAL
  Filled 2016-10-02 (×13): qty 2

## 2016-10-02 MED ORDER — VITAMIN B-1 100 MG PO TABS
100.0000 mg | ORAL_TABLET | Freq: Every day | ORAL | Status: DC
  Administered 2016-10-03: 100 mg via ORAL
  Filled 2016-10-02 (×3): qty 1

## 2016-10-02 MED ORDER — PANTOPRAZOLE SODIUM 40 MG PO TBEC
40.0000 mg | DELAYED_RELEASE_TABLET | Freq: Every day | ORAL | Status: DC
  Administered 2016-10-03 – 2016-10-06 (×4): 40 mg via ORAL
  Filled 2016-10-02 (×7): qty 1

## 2016-10-02 MED ORDER — GI COCKTAIL ~~LOC~~
30.0000 mL | Freq: Once | ORAL | Status: AC
  Administered 2016-10-02: 30 mL via ORAL
  Filled 2016-10-02: qty 30

## 2016-10-02 NOTE — ED Notes (Signed)
Pt received dinner tray.

## 2016-10-02 NOTE — ED Notes (Signed)
Patient ambulated without assistance, wearing cam boot, to restroom. Gait slow, steady.
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
## 2016-10-02 NOTE — ED Notes (Signed)
Patient was given a snack and drink, and a regular diet was ordered for Lunch. 

## 2016-10-02 NOTE — ED Provider Notes (Signed)
Patient has been accepted at [facility] by Dr. Gailen ShelterWinthrow.    Dione Boozeavid Linlee Cromie, MD 10/02/16 [phone]

## 2016-10-02 NOTE — BHH Counselor (Addendum)
Writer called and spoke w/ pt's emergency contact Kevan NyBarbara Pruitt in [place] [phone]. Writer updated Pruitt on pt's current situation. Pruitt reports pt's mother committed suicide. She also reports Cresenciano Genreruitt has called her daily for past several days threatening suicide. Writer updated Claudette Headonrad Withrow DNP who [facility] d/t pt's inability to contract for safety and her alcohol abuse.  Writer notified EDP [place] of change of disposition.   Evette Cristalaroline Paige Naryiah Schley, [place] Therapeutic Triage Specialist [phone]   Writer spoke w/ pt re: permission to call family/friends in the area. Pt gives Clinical research associatewriter permission to call but sts she only has one "former friend and Arts administratorbaby sitter in [place]". Pt says she has no social support system in [place]. Pt tells Clinical research associatewriter that "behavioral health" is getting a bed for her. Writer explained that pt isn't [facility] as she isn't currently a danger to herself or others and isn't psychotic. Pt sts that she can't take the bus if d/c b/c buses aren't running and she sts that she is getting nervous about possible discharge. Writer notified Claudette Headonrad Withrow DNP and Clinical research associatewriter spoke w/ EDP Ranae PalmsYelverton and requested social work consult. Ranae PalmsYelverton will put in consult.  Evette Cristalaroline Paige Ranny Wiebelhaus, [place] Therapeutic Triage Specialist [phone]

## 2016-10-02 NOTE — ED Notes (Signed)
Patient ambulated from Room 36 to a bed outside trauma rooms B and C
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
## 2016-10-02 NOTE — [facility] Counselor (Signed)
Per Isidoro DonningShalita AC at [facility], pt [facility] and can be transferred as soon as possible. # for report is [phone]. Writer notified pt's RN who reports pt has recently been given nausea medication.  Evette Cristalaroline Paige Jenell Dobransky, [place] Therapeutic Triage Specialist

## 2016-10-02 NOTE — ED Triage Notes (Signed)
Pt walking in hall asking for ativan.

## 2016-10-02 NOTE — ED Notes (Signed)
Cam walker applied to left foot by ortho tech
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
## 2016-10-02 NOTE — ED Notes (Signed)
Ordered dinner tray for pt.  

## 2016-10-02 NOTE — ED Notes (Signed)
Pt continuing to pace up and down hall.

## 2016-10-03 ENCOUNTER — Encounter (HOSPITAL_COMMUNITY): Payer: Self-pay

## 2016-10-03 DIAGNOSIS — F332 Major depressive disorder, recurrent severe without psychotic features: Secondary | ICD-10-CM

## 2016-10-03 DIAGNOSIS — Z79899 Other long term (current) drug therapy: Secondary | ICD-10-CM

## 2016-10-03 DIAGNOSIS — Z888 Allergy status to other drugs, medicaments and biological substances status: Secondary | ICD-10-CM

## 2016-10-03 LAB — COMPREHENSIVE METABOLIC PANEL
ALK PHOS: 89 U/L (ref 38–126)
ALT: 28 U/L (ref 14–54)
ANION GAP: 11 (ref 5–15)
AST: 50 U/L — ABNORMAL HIGH (ref 15–41)
Albumin: 4 g/dL (ref 3.5–5.0)
BILIRUBIN TOTAL: 0.6 mg/dL (ref 0.3–1.2)
BUN: 6 mg/dL (ref 6–20)
CALCIUM: 9.2 mg/dL (ref 8.9–10.3)
CO2: 23 mmol/L (ref 22–32)
CREATININE: 0.58 mg/dL (ref 0.44–1.00)
Chloride: 106 mmol/L (ref 101–111)
Glucose, Bld: 87 mg/dL (ref 65–99)
Potassium: 4 mmol/L (ref 3.5–5.1)
Sodium: 140 mmol/L (ref 135–145)
TOTAL PROTEIN: 7 g/dL (ref 6.5–8.1)

## 2016-10-03 LAB — LIPID PANEL
Cholesterol: 208 mg/dL — ABNORMAL HIGH (ref 0–200)
HDL: 128 mg/dL (ref >40)
LDL CALC: 71 mg/dL (ref 0–99)
TRIGLYCERIDES: 46 mg/dL (ref <150)
Total CHOL/HDL Ratio: 1.6 RATIO
VLDL: 9 mg/dL (ref 0–40)

## 2016-10-03 LAB — TSH: TSH: 3.746 u[IU]/mL (ref 0.350–4.500)

## 2016-10-03 MED ORDER — LORAZEPAM 1 MG PO TABS
1.0000 mg | ORAL_TABLET | Freq: Four times a day (QID) | ORAL | Status: AC
  Administered 2016-10-03 – 2016-10-04 (×4): 1 mg via ORAL
  Filled 2016-10-03 (×3): qty 1

## 2016-10-03 MED ORDER — LORAZEPAM 1 MG PO TABS
1.0000 mg | ORAL_TABLET | Freq: Two times a day (BID) | ORAL | Status: AC
  Administered 2016-10-05 – 2016-10-06 (×2): 1 mg via ORAL
  Filled 2016-10-03 (×2): qty 1

## 2016-10-03 MED ORDER — LORAZEPAM 1 MG PO TABS
1.0000 mg | ORAL_TABLET | Freq: Three times a day (TID) | ORAL | Status: AC
  Administered 2016-10-04 – 2016-10-05 (×3): 1 mg via ORAL
  Filled 2016-10-03 (×3): qty 1

## 2016-10-03 MED ORDER — LOPERAMIDE HCL 2 MG PO CAPS: 2.0000 mg | ORAL_CAPSULE | ORAL | Status: AC | PRN

## 2016-10-03 MED ORDER — ONDANSETRON 4 MG PO TBDP: 4.0000 mg | ORAL_TABLET | Freq: Four times a day (QID) | ORAL | Status: AC | PRN

## 2016-10-03 MED ORDER — VITAMIN B-1 100 MG PO TABS
100.0000 mg | ORAL_TABLET | Freq: Every day | ORAL | Status: DC
  Administered 2016-10-04 – 2016-10-06 (×3): 100 mg via ORAL
  Filled 2016-10-03 (×5): qty 1

## 2016-10-03 MED ORDER — LORAZEPAM 1 MG PO TABS
1.0000 mg | ORAL_TABLET | Freq: Four times a day (QID) | ORAL | Status: AC | PRN
  Administered 2016-10-04 (×2): 1 mg via ORAL
  Filled 2016-10-03 (×3): qty 1

## 2016-10-03 MED ORDER — QUETIAPINE FUMARATE 200 MG PO TABS
200.0000 mg | ORAL_TABLET | Freq: Every day | ORAL | Status: DC
  Administered 2016-10-03: 200 mg via ORAL
  Filled 2016-10-03 (×3): qty 1

## 2016-10-03 MED ORDER — LORAZEPAM 1 MG PO TABS: 1.0000 mg | ORAL_TABLET | Freq: Every day | ORAL | Status: DC

## 2016-10-03 MED ORDER — ADULT MULTIVITAMIN W/MINERALS CH
1.0000 | ORAL_TABLET | Freq: Every day | ORAL | Status: DC
  Administered 2016-10-03 – 2016-10-06 (×4): 1 via ORAL
  Filled 2016-10-03 (×8): qty 1

## 2016-10-03 MED ORDER — HYDROXYZINE HCL 25 MG PO TABS
25.0000 mg | ORAL_TABLET | Freq: Four times a day (QID) | ORAL | Status: AC | PRN
Start: 2016-10-03 — End: 2016-10-06
  Administered 2016-10-03: 25 mg via ORAL
  Filled 2016-10-03: qty 1

## 2016-10-03 NOTE — Progress Notes (Signed)
Data. Patient denies SI/HI/AVH. Patient isolating in her room most of shift, with the exception of meals. Patient reported , "Really bad detox". No tremors noted with assessment. C/O  H/A and nausea also. PRNs given as ordered. Patient did not complete her self assessment this shift. Action. Emotional support and encouragement offered. Education provided on medication, indications and side effect. Q 15 minute checks done for safety. Response. Safety on the unit maintained through 15 minute checks.  Medications taken as prescribed.

## 2016-10-03 NOTE — Tx Team (Signed)
Initial Treatment Plan 10/03/2016 2:10 AM Mary Dodson [id number]    PATIENT STRESSORS: Financial difficulties Health problems Substance abuse   PATIENT STRENGTHS: Communication skills General fund of knowledge Motivation for treatment/growth   PATIENT IDENTIFIED PROBLEMS: Substance abuse  depression  "I need to fix my problem. I drink too much."  "I just feel so down"               DISCHARGE CRITERIA:  Ability to meet basic life and health needs Adequate post-discharge living arrangements Improved stabilization in mood, thinking, and/or behavior  PRELIMINARY DISCHARGE PLAN: Attend aftercare/continuing care group Outpatient therapy  PATIENT/FAMILY INVOLVEMENT: This treatment plan has been presented to and reviewed with the patient, Mary Dodson.  The patient and family have been given the opportunity to ask questions and make suggestions.  Jonetta SpeakAshton E Chay Mazzoni, RN 10/03/2016, 2:10 AM
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
## 2016-10-03 NOTE — [facility] Group Notes (Signed)
[facility] LCSW Group Therapy  10/03/2016 3:05 PM  Type of Therapy:  Group Therapy  Participation Level:  Did Not Attend-pt sleeping in room. Invited.   Summary of Progress/Problems: Today's Topic: Overcoming Obstacles. Patients identified one short term goal and potential obstacles in reaching this goal. Patients processed barriers involved in overcoming these obstacles. Patients identified steps necessary for overcoming these obstacles and explored motivation (internal and external) for facing these difficulties head on.   Mary Dodson N Smart LCSW 10/03/2016, 3:05 PM

## 2016-10-03 NOTE — Progress Notes (Signed)
Patient did attend the evening speaker AA meeting.  

## 2016-10-03 NOTE — Tx Team (Signed)
Interdisciplinary Treatment and Diagnostic Plan Update  10/03/2016 Time of Session: 9:30AM Mary Dodson MRN: [id number]  Principal Diagnosis: Alcohol abuse  Secondary Diagnoses: Principal Problem:   Alcohol abuse   Current Medications:  Current Facility-Administered Medications  Medication Dose Route Frequency Provider Last Rate Last Dose  . acetaminophen (TYLENOL) tablet 650 mg  650 mg Oral Q6H PRN Beau FannyJohn C Withrow, FNP   650 mg at 10/03/16 [id number]  . alum & mag hydroxide-simeth (MAALOX/MYLANTA) 200-200-20 MG/5ML suspension 30 mL  30 mL Oral Q4H PRN Beau FannyJohn C Withrow, FNP      . gabapentin (NEURONTIN) capsule 800 mg  800 mg Oral TID Beau FannyJohn C Withrow, FNP   800 mg at 10/03/16 [id number]  . hydrOXYzine (ATARAX/VISTARIL) tablet 25 mg  25 mg Oral Q6H PRN Neysa Hottereina Hisada, MD      . loperamide (IMODIUM) capsule 2-4 mg  2-4 mg Oral PRN Neysa Hottereina Hisada, MD      . LORazepam (ATIVAN) tablet 1 mg  1 mg Oral Q6H PRN Neysa Hottereina Hisada, MD      . LORazepam (ATIVAN) tablet 1 mg  1 mg Oral QID Neysa Hottereina Hisada, MD       Followed by  . [START ON 10/04/2016] LORazepam (ATIVAN) tablet 1 mg  1 mg Oral TID Neysa Hottereina Hisada, MD       Followed by  . [START ON 10/05/2016] LORazepam (ATIVAN) tablet 1 mg  1 mg Oral BID Neysa Hottereina Hisada, MD       Followed by  . [START ON 10/07/2016] LORazepam (ATIVAN) tablet 1 mg  1 mg Oral Daily Neysa Hottereina Hisada, MD      . magnesium hydroxide (MILK OF MAGNESIA) suspension 30 mL  30 mL Oral Daily PRN Beau FannyJohn C Withrow, FNP      . multivitamin with minerals tablet 1 tablet  1 tablet Oral Daily Neysa Hottereina Hisada, MD      . ondansetron (ZOFRAN-ODT) disintegrating tablet 4 mg  4 mg Oral Q6H PRN Neysa Hottereina Hisada, MD      . pantoprazole (PROTONIX) EC tablet 40 mg  40 mg Oral Daily Beau FannyJohn C Withrow, FNP   40 mg at 10/03/16 0815  . QUEtiapine (SEROQUEL) tablet 200 mg  200 mg Oral QHS Neysa Hottereina Hisada, MD      . thiamine (B-1) injection 100 mg  100 mg Intravenous Daily Beau FannyJohn C Withrow, FNP      . [START ON 10/04/2016] thiamine (VITAMIN B-1)  tablet 100 mg  100 mg Oral Daily Neysa Hottereina Hisada, MD      . traZODone (DESYREL) tablet 50 mg  50 mg Oral QHS PRN Beau FannyJohn C Withrow, FNP       PTA Medications: Prescriptions Prior to Admission  Medication Sig Dispense Refill Last Dose  . gabapentin (NEURONTIN) 800 MG tablet Take 800 mg by mouth 3 (three) times daily.   Past Week at Unknown time  . pantoprazole (PROTONIX) 40 MG tablet Take 40 mg by mouth daily.   Past Week at Unknown time  . promethazine (PHENERGAN) 12.5 MG tablet Take 12.5 mg by mouth every 6 (six) hours as needed for nausea or vomiting.   Past Week at Unknown time  . QUEtiapine (SEROQUEL) 300 MG tablet Take 300 mg by mouth 3 (three) times daily.   Past Week at Unknown time    Patient Stressors: Financial difficulties Health problems Substance abuse  Patient Strengths: DentistCommunication skills General fund of knowledge Motivation for treatment/growth  Treatment Modalities: Medication Management, Group therapy, Case management,  1 to 1 session with clinician, Psychoeducation,  Recreational therapy.   Physician Treatment Plan for Primary Diagnosis: Alcohol abuse Long Term Goal(s): Improvement in symptoms so as ready for discharge Improvement in symptoms so as ready for discharge   Short Term Goals: Ability to identify changes in lifestyle to reduce recurrence of condition will improve Ability to identify and develop effective coping behaviors will improve Ability to identify changes in lifestyle to reduce recurrence of condition will improve Ability to demonstrate self-control will improve Ability to identify and develop effective coping behaviors will improve  Medication Management: Evaluate patient's response, side effects, and tolerance of medication regimen.  Therapeutic Interventions: 1 to 1 sessions, Unit Group sessions and Medication administration.  Evaluation of Outcomes: Progressing  Physician Treatment Plan for Secondary Diagnosis: Principal Problem:   Alcohol  abuse  Long Term Goal(s): Improvement in symptoms so as ready for discharge Improvement in symptoms so as ready for discharge   Short Term Goals: Ability to identify changes in lifestyle to reduce recurrence of condition will improve Ability to identify and develop effective coping behaviors will improve Ability to identify changes in lifestyle to reduce recurrence of condition will improve Ability to demonstrate self-control will improve Ability to identify and develop effective coping behaviors will improve     Medication Management: Evaluate patient's response, side effects, and tolerance of medication regimen.  Therapeutic Interventions: 1 to 1 sessions, Unit Group sessions and Medication administration.  Evaluation of Outcomes: Progressing   RN Treatment Plan for Primary Diagnosis: Alcohol abuse Long Term Goal(s): Knowledge of disease and therapeutic regimen to maintain health will improve  Short Term Goals: Ability to remain free from injury will improve, Ability to disclose and discuss suicidal ideas and Ability to identify and develop effective coping behaviors will improve  Medication Management: RN will administer medications as ordered by provider, will assess and evaluate patient's response and provide education to patient for prescribed medication. RN will report any adverse and/or side effects to prescribing provider.  Therapeutic Interventions: 1 on 1 counseling sessions, Psychoeducation, Medication administration, Evaluate responses to treatment, Monitor vital signs and CBGs as ordered, Perform/monitor CIWA, COWS, AIMS and Fall Risk screenings as ordered, Perform wound care treatments as ordered.  Evaluation of Outcomes: Progressing   LCSW Treatment Plan for Primary Diagnosis: Alcohol abuse Long Term Goal(s): Safe transition to appropriate next level of care at discharge, Engage patient in therapeutic group addressing interpersonal concerns.  Short Term Goals: Engage  patient in aftercare planning with referrals and resources, Facilitate patient progression through stages of change regarding substance use diagnoses and concerns and Identify triggers associated with mental health/substance abuse issues  Therapeutic Interventions: Assess for all discharge needs, 1 to 1 time with Social worker, Explore available resources and support systems, Assess for adequacy in community support network, Educate family and significant other(s) on suicide prevention, Complete Psychosocial Assessment, Interpersonal group therapy.  Evaluation of Outcomes: Progressing   Progress in Treatment: Attending groups: No. New to unit. Continuing to assess.  Participating in groups: No. Taking medication as prescribed: Yes. Toleration medication: Yes. Family/Significant other contact made: No, will contact:  family member if patient consents Patient understands diagnosis: Yes. Discussing patient identified problems/goals with staff: No. Medical problems stabilized or resolved: No. Denies suicidal/homicidal ideation: Yes. self report.  Issues/concerns per patient self-inventory: No. Other: n/a   New problem(s) identified: No, Describe:  n/a  New Short Term/Long Term Goal(s): medication stabilization; detox; development of comprehensive mental wellness/sobriety plan.   Discharge Plan or Barriers:  CSW assessing for appropriate referrals. Pt has  no current outpatient providers.   Reason for Continuation of Hospitalization: Depression Medication stabilization Withdrawal symptoms  Estimated Length of Stay: 3-5 days   Attendees: Patient: 10/03/2016 11:30 AM  Physician: Dr. Vanetta Shawl MD; Dr. Jama Flavors MD 10/03/2016 11:30 AM  Nursing: Dalphine Handing RN 10/03/2016 11:30 AM  RN Care Manager: Onnie Boer CM 10/03/2016 11:30 AM  Social Worker: Trula Slade, LCSW 10/03/2016 11:30 AM  Recreational Therapist:  10/03/2016 11:30 AM  Other: Armandina Stammer NP 10/03/2016 11:30 AM  Other:   10/03/2016 11:30 AM  Other: 10/03/2016 11:30 AM    Scribe for Treatment Team: Ledell Peoples Smart, LCSW 10/03/2016 11:30 AM
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
## 2016-10-03 NOTE — Plan of Care (Signed)
Problem: Safety: Goal: Ability to remain free from injury will improve Outcome: Progressing Patient has remained free from self harm, this shift.

## 2016-10-03 NOTE — [facility] Counselor (Signed)
CSW attempted to complete PSA with pt. She was groggy and not responding to questions. Patient asked to sleep. CSW asked that patient participate in assessment tomorrow morning and patient agreeable.  Trula SladeHeather Smart, MSW, LCSW Clinical Social Worker 10/03/2016 3:08 PM

## 2016-10-03 NOTE — Progress Notes (Signed)
Nursing Progress Note: 7p-7a D: Pt currently presents with a anxious/jittery/flat affect and behavior. Pt reports to writer that their goal is to "to get clean." Pt states "I just need to get sober." Interacting appropriatley with milieu. Pt reports good sleep with current medication regimen.   A: Pt provided with medications per providers orders. Pt's labs and vitals were monitored throughout the night. Pt supported emotionally and encouraged to express concerns and questions. Pt educated on medications.  R: Pt's safety ensured with 15 minute and environmental checks. Pt currently denies SI/HI/Self Harm and A/V hallucinations. Pt verbally contracts to seek staff if SI/HI or A/VH occurs and to consult with staff before acting on any harmful thoughts. Will continue to monitor.
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
## 2016-10-03 NOTE — Progress Notes (Signed)
Recreation Therapy Notes    Animal-Assisted Activity (AAA) Program Checklist/Progress Notes Patient Eligibility Criteria Checklist & Daily Group note for Rec TxIntervention  Date: 12.26.2017 Time: 2:45pm Location: 400 Hall Dayroom    AAA/T Program Assumption of Risk Form signed by Patient/ or Parent Legal Guardian Yes  Patient is free of allergies or sever asthma Yes  Patient reports no fear of animals Yes  Patient reports no history of cruelty to animals Yes  Patient understands his/her participation is voluntary Yes  Behavioral Response: Did not attend.   Helaman Mecca L Aynsley Fleet, LRT/CTRS  Ardean Simonich L 10/03/2016 3:04 PM 

## 2016-10-04 ENCOUNTER — Encounter (HOSPITAL_COMMUNITY): Payer: Self-pay | Admitting: Family

## 2016-10-04 DIAGNOSIS — F3181 Bipolar II disorder: Secondary | ICD-10-CM | POA: Diagnosis present

## 2016-10-04 LAB — PROLACTIN: Prolactin: 21.6 ng/mL (ref 4.8–23.3)

## 2016-10-04 LAB — HEMOGLOBIN A1C
HEMOGLOBIN A1C: 5.4 % (ref 4.8–5.6)
MEAN PLASMA GLUCOSE: 108 mg/dL

## 2016-10-04 MED ORDER — QUETIAPINE FUMARATE 300 MG PO TABS
300.0000 mg | ORAL_TABLET | Freq: Every day | ORAL | Status: DC
  Administered 2016-10-04: 300 mg via ORAL
  Filled 2016-10-04 (×2): qty 1

## 2016-10-04 MED ORDER — LEVOTHYROXINE SODIUM 25 MCG PO TABS
25.0000 ug | ORAL_TABLET | Freq: Every day | ORAL | Status: DC
  Administered 2016-10-05 – 2016-10-06 (×2): 25 ug via ORAL
  Filled 2016-10-04: qty 7
  Filled 2016-10-04 (×3): qty 1

## 2016-10-04 MED ORDER — QUETIAPINE FUMARATE 25 MG PO TABS
25.0000 mg | ORAL_TABLET | Freq: Two times a day (BID) | ORAL | Status: DC
  Administered 2016-10-04 – 2016-10-05 (×2): 25 mg via ORAL
  Filled 2016-10-04 (×4): qty 1

## 2016-10-04 NOTE — Plan of Care (Signed)
Problem: Education: Goal: Knowledge of disease or condition will improve Outcome: Progressing Nurse discussed depression/anxiety/coping skills with patient.

## 2016-10-04 NOTE — Progress Notes (Signed)
Nursing Progress Note: 7p-7a D: Pt currently presents with a flat/anxious/irritable affect and behavior. Pt states "honestly, I'm just so upset with my situation. I can't get any sleep without xanax or valium. How am I supposed to sleep? There isn't anything you can do. I just want my pills." Interacting appropriately with milieu. Pt reports off and on sleep with current medication regimen.   A: Pt provided with medications per providers orders. Pt's labs and vitals were monitored throughout the night. Pt supported emotionally and encouraged to express concerns and questions. Pt educated on medications.  R: Pt's safety ensured with 15 minute and environmental checks. Pt currently denies SI/HI/Self Harm and A/V hallucinations. Pt verbally contracts to seek staff if SI/HI or A/VH occurs and to consult with staff before acting on any harmful thoughts. Will continue to monitor.

## 2016-10-04 NOTE — Progress Notes (Signed)
Patient is not happy with her medications.  Has discussed her medications with NP today.  Nurse wrote NP a note and talked with NP about patient's medications.  Patient [facility]  Patient continues to talk loudly to peers about her medications concerns.

## 2016-10-04 NOTE — Progress Notes (Signed)
Patient attended AA group meeting.  

## 2016-10-04 NOTE — [facility] Suicide Risk Assessment (Signed)
[facility] INPATIENT:  Family/Significant Other Suicide Prevention Education  Suicide Prevention Education:  Patient Refusal for Family/Significant Other Suicide Prevention Education: The patient Mary Dodson has refused to provide written consent for family/significant other to be provided Family/Significant Other Suicide Prevention Education during admission and/or prior to discharge.  Physician notified.  SPE completed with pt, as pt refused to consent to family contact. SPI pamphlet provided to pt and pt was encouraged to share information with support network, ask questions, and talk about any concerns relating to SPE. Pt denies access to guns/firearms and verbalized understanding of information provided. Mobile Crisis information also provided to pt.   Mosella Kasa N Smart LCSW 10/04/2016, 10:31 AM

## 2016-10-04 NOTE — Progress Notes (Signed)
[facility] MD Progress Note  10/04/2016 9:50 AM Mary Dodson  MRN:  [id number] Subjective:  "I'm just all messed up on my meds. I don't have any problems except PTSD. That's my only issue."  Objective: Pt seen and chart reviewed. Pt is alert/oriented x4, calm, cooperative, and appropriate to situation. Pt denies suicidal/homicidal ideation and psychosis and does not appear to be responding to internal stimuli. However, pt presents as very manic, yet easily redirected. Pt reports that she has been on Seroquel 355m tid for a long time and that this has kept her mood stabile. I informed her that it's a very high dose but that we will add Seroquel low-dose daytime and increase the night dose slightly and titrate from there. Pt in agreement. Additionally, pt was upset that her synthroid was not ordered. Pt was extremely knowledgeable about synthroid stating that it must be taken in the AM without food (and many other details) which support a high likelihood that this is indeed a home med. Will restart and will recheck TSH 48h later. Pt asked for more ativan; I informed her that she is on a detox protocol for her high BAL and that we will not be changing the Ativan.   Principal Problem: Alcohol abuse Diagnosis:   Patient Active Problem List   Diagnosis Date Noted  . Alcohol abuse [F10.10] 10/02/2016  . MDD (major depressive disorder), recurrent severe, without psychosis ([place] [F33.2] 10/02/2016   Total Time spent with patient: 15 minutes  Past Psychiatric History: ETOH, PTSD, insomnia, depression, GAD  Past Medical History:  Past Medical History:  Diagnosis Date  . PTSD (post-traumatic stress disorder)    History reviewed. No pertinent surgical history. Family History: History reviewed. No pertinent family history. Family Psychiatric  History: denies Social History:  History  Alcohol Use  . Yes    Comment: binges 7-8 drinks until blackout     History  Drug Use No    Social History   Social  History  . Marital status: Single    Spouse name: N/A  . Number of children: N/A  . Years of education: N/A   Social History Main Topics  . Smoking status: Current Every Day Smoker    Packs/day: 0.50  . Smokeless tobacco: Never Used  . Alcohol use Yes     Comment: binges 7-8 drinks until blackout  . Drug use: No  . Sexual activity: No   Other Topics Concern  . None   Social History Narrative  . None   Additional Social History:                         Sleep: Poor  Appetite:  Fair  Current Medications: Current Facility-Administered Medications  Medication Dose Route Frequency Provider Last Rate Last Dose  . acetaminophen (TYLENOL) tablet 650 mg  650 mg Oral Q6H PRN JBenjamine Mola FNP   650 mg at 10/03/16 2134  . alum & mag hydroxide-simeth (MAALOX/MYLANTA) 200-200-20 MG/5ML suspension 30 mL  30 mL Oral Q4H PRN JBenjamine Mola FNP   30 mL at 10/03/16 1812  . gabapentin (NEURONTIN) capsule 800 mg  800 mg Oral TID JBenjamine Mola FNP   800 mg at 10/04/16 0830  . hydrOXYzine (ATARAX/VISTARIL) tablet 25 mg  25 mg Oral Q6H PRN RNorman Clay MD   25 mg at 10/03/16 2306  . loperamide (IMODIUM) capsule 2-4 mg  2-4 mg Oral PRN RNorman Clay MD      .  LORazepam (ATIVAN) tablet 1 mg  1 mg Oral Q6H PRN Norman Clay, MD      . LORazepam (ATIVAN) tablet 1 mg  1 mg Oral TID Norman Clay, MD       Followed by  . [START ON 10/05/2016] LORazepam (ATIVAN) tablet 1 mg  1 mg Oral BID Norman Clay, MD       Followed by  . [START ON 10/07/2016] LORazepam (ATIVAN) tablet 1 mg  1 mg Oral Daily Norman Clay, MD      . magnesium hydroxide (MILK OF MAGNESIA) suspension 30 mL  30 mL Oral Daily PRN Benjamine Mola, FNP      . multivitamin with minerals tablet 1 tablet  1 tablet Oral Daily Norman Clay, MD   1 tablet at 10/04/16 0830  . ondansetron (ZOFRAN-ODT) disintegrating tablet 4 mg  4 mg Oral Q6H PRN Norman Clay, MD      . pantoprazole (PROTONIX) EC tablet 40 mg  40 mg Oral Daily Benjamine Mola, FNP   40 mg at 10/04/16 0830  . QUEtiapine (SEROQUEL) tablet 200 mg  200 mg Oral QHS Norman Clay, MD   200 mg at 10/03/16 2134  . thiamine (B-1) injection 100 mg  100 mg Intravenous Daily Benjamine Mola, FNP      . thiamine (VITAMIN B-1) tablet 100 mg  100 mg Oral Daily Norman Clay, MD   100 mg at 10/04/16 0831  . traZODone (DESYREL) tablet 50 mg  50 mg Oral QHS PRN Benjamine Mola, FNP        Lab Results:  Results for orders placed or performed during the hospital encounter of 10/02/16 (from the past 48 hour(s))  Comprehensive metabolic panel     Status: Abnormal   Collection Time: 10/03/16  6:25 AM  Result Value Ref Range   Sodium 140 135 - 145 mmol/L   Potassium 4.0 3.5 - 5.1 mmol/L   Chloride 106 101 - 111 mmol/L   CO2 23 22 - 32 mmol/L   Glucose, Bld 87 65 - 99 mg/dL   BUN 6 6 - 20 mg/dL   Creatinine, Ser 0.58 0.44 - 1.00 mg/dL   Calcium 9.2 8.9 - 10.3 mg/dL   Total Protein 7.0 6.5 - 8.1 g/dL   Albumin 4.0 3.5 - 5.0 g/dL   AST 50 (H) 15 - 41 U/L   ALT 28 14 - 54 U/L   Alkaline Phosphatase 89 38 - 126 U/L   Total Bilirubin 0.6 0.3 - 1.2 mg/dL   GFR calc non Af Amer >60 >60 mL/min   GFR calc Af Amer >60 >60 mL/min    Comment: (NOTE) The eGFR has been calculated using the CKD EPI equation. This calculation has not been validated in all clinical situations. eGFR's persistently <60 mL/min signify possible Chronic Kidney Disease.    Anion gap 11 5 - 15    Comment: Performed at [facility]  Hemoglobin A1c     Status: None   Collection Time: 10/03/16  6:25 AM  Result Value Ref Range   Hgb A1c MFr Bld 5.4 4.8 - 5.6 %    Comment: (NOTE)         Pre-diabetes: 5.7 - 6.4         Diabetes: >6.4         Glycemic control for adults with diabetes: <7.0    Mean Plasma Glucose 108 mg/dL    Comment: (NOTE) Performed At: [facility] [address]  [place], [place] [id number] Lindon Romp MD [id number] Performed at [facility]   Lipid panel     Status: Abnormal   Collection Time: 10/03/16  6:25 AM  Result Value Ref Range   Cholesterol 208 (H) 0 - 200 mg/dL   Triglycerides 46 <150 mg/dL   HDL 128 >40 mg/dL   Total CHOL/HDL Ratio 1.6 RATIO   VLDL 9 0 - 40 mg/dL   LDL Cholesterol 71 0 - 99 mg/dL    Comment:        Total Cholesterol/HDL:CHD Risk Coronary Heart Disease Risk Table                     Men   Women  1/2 Average Risk   3.4   3.3  Average Risk       5.0   4.4  2 X Average Risk   9.6   7.1  3 X Average Risk  23.4   11.0        Use the calculated Patient Ratio above and the CHD Risk Table to determine the patient's CHD Risk.        ATP III CLASSIFICATION (LDL):  <100     mg/dL   Optimal  100-129  mg/dL   Near or Above                    Optimal  130-159  mg/dL   Borderline  160-189  mg/dL   High  >190     mg/dL   Very High Performed at [facility]   Prolactin     Status: None   Collection Time: 10/03/16  6:25 AM  Result Value Ref Range   Prolactin 21.6 4.8 - 23.3 ng/mL    Comment: (NOTE) Performed At: [facility] [place], [place] [id number] Lindon Romp MD [id number] Performed at [facility]   TSH     Status: None   Collection Time: 10/03/16  6:25 AM  Result Value Ref Range   TSH 3.746 0.350 - 4.500 uIU/mL    Comment: Performed by a 3rd Generation assay with a functional sensitivity of <=0.01 uIU/mL. Performed at [facility]     Blood Alcohol level:  Lab Results  Component Value Date   ETH 266 (H) 24/82/5003    Metabolic Disorder Labs: Lab Results  Component Value Date   HGBA1C 5.4 10/03/2016   MPG 108 10/03/2016   Lab Results  Component Value Date   PROLACTIN 21.6 10/03/2016   Lab Results  Component Value Date   CHOL 208 (H) 10/03/2016   TRIG 46 10/03/2016   HDL 128 10/03/2016   CHOLHDL 1.6 10/03/2016   VLDL 9 10/03/2016   LDLCALC 71 10/03/2016    Physical Findings: AIMS:   , ,  ,  ,    CIWA:  CIWA-Ar Total: 4 COWS:     Musculoskeletal: Strength & Muscle Tone: within normal limits Gait & Station: normal Patient leans: N/A  Psychiatric Specialty Exam: Physical Exam  Review of Systems  Psychiatric/Behavioral: Positive for depression and substance abuse. The patient is nervous/anxious and has insomnia.   All other systems reviewed and are negative.   Blood pressure 103/72, pulse 95, temperature 97.6 F (36.4 C), resp. rate 16, height _0  (1.676 m), weight 64.4 kg (142 lb), last menstrual period 09/01/2016, SpO2 100 %, unknown if currently breastfeeding.Body mass index is 22.92 kg/m.  General Appearance: Bizarre  Eye Contact:  Good  Speech:  Clear and Coherent and Pressured  Volume:  Increased  Mood:  Anxious  Affect:  Appropriate and Congruent  Thought Process:  Coherent, Goal Directed and Descriptions of Associations: Intact  Orientation:  Full (Time, Place, and Person)  Thought Content:  Focused on meds, focused on her foot fx  Suicidal Thoughts:  No  Homicidal Thoughts:  No  Memory:  Immediate;   Fair Recent;   Fair Remote;   Fair  Judgement:  Fair  Insight:  Fair  Psychomotor Activity:  Normal  Concentration:  Concentration: Fair and Attention Span: Fair  Recall:  [employer] of Knowledge:  Fair  Language:  Fair  Akathisia:  No  Handed:    AIMS (if indicated):     Assets:  Communication Skills Desire for Improvement Resilience Social Support  ADL's:  Intact  Cognition:  WNL  Sleep:  Number of Hours: 6   Treatment Plan Summary: 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

## 2016-10-04 NOTE — Progress Notes (Signed)
D:  Patient's self inventory sheet, patient has poor sleep, sleep medication helpful.  Fair appetite, normal energy level, poor concentration.  Rated depression and hopeless 5, anxiety 10.  Withdrawals, tremors, diarrhea, cramping, nausea, runny nose.  Denied SI.  Physical problems, leg pain, worst pain #7, pain medication is helpful.  Goal is to work on getting better. No discharge plans. A:  Medications administered per MD orders.  Emotional support and encouragement given. R:  Patient denied SI and HI, contracts for safety.  Denied A/V hallucinations.  Safety maintained with 15 minute checks. Patient has been wearing L ft/lower leg boot because of foot fracture.   Written medication list given to patient for current medications per patient request.

## 2016-10-04 NOTE — [facility] Group Notes (Signed)
[facility] LCSW Group Therapy  10/04/2016 4:13 PM  Type of Therapy:  Group Therapy  Participation Level:  Active  Participation Quality:  Intrusive and Monopolizing  Affect:  Excited and Irritable  Cognitive:  Lacking  Insight:  Distracting, Limited, Off Topic and Poor  Engagement in Therapy:  Limited  Modes of Intervention:  Education, Exploration, Limit-setting, Problem-solving, Socialization and Support  Summary of Progress/Problems: Today's Topic: Overcoming Obstacles. Patients identified one short term goal and potential obstacles in reaching this goal. Patients processed barriers involved in overcoming these obstacles. Patients identified steps necessary for overcoming these obstacles and explored motivation (internal and external) for facing these difficulties head on. Mary Dodson presents with irritable mood and manic affect. She had to be redirected several times regarding being off topic and obsessing about her medication list. Patient interrupted others but was receptive to redirection. CSW asked pt to write down her thoughts and her problem list to give to the provider when she saw him. She did this for the entirety of group and was able to calm herself down. At this time, Mary Dodson still demonstrates limited/poor insight with little progress in the group setting. She did offer to role play with another patient who was sharing an obstacle.   Candace Begue N Smart LCSW 10/04/2016, 4:13 PM

## 2016-10-04 NOTE — Progress Notes (Signed)
Recreation Therapy Notes  Date: 10/04/16 Time: 0930 Location: [address] Dayroom  Group Topic: Stress Management  Goal Area(s) Addresses:  Patient will verbalize importance of using healthy stress management.  Patient will identify positive emotions associated with healthy stress management.   Intervention: Calm App  Activity :  Resilience Meditation.  LRT introduced the stress management technique of meditation.  LRT played the meditation from the calm app to engage patients in the activity.  Patients were to follow along as the meditation played to engage in the process.  Education:  Stress Management, Discharge Planning.   Education Outcome: Acknowledges edcuation/In group clarification offered/Needs additional education  Clinical Observations/Feedback: Pt did not attend  group.   Caroll RancherMarjette Iness Pangilinan, LRT/CTRS         Caroll RancherLindsay, Lisseth Brazeau A 10/04/2016 12:26 PM

## 2016-10-04 NOTE — Progress Notes (Signed)
EKG COMPLETED PER MD ORDER.  
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
## 2016-10-04 NOTE — Progress Notes (Signed)
Patient ID: Ophelia CharterCheryl Diop, female   DOB: 1965-12-15, 50 y.o.   MRN: [id number] D: Client loud, argumentative, complaining that she wasn't ordered Remeron that she requested. "I want to sleep like the rest of them, if he had given me the Remeron like I wanted I would go to sleep" Client complaining of patient rights asking for her medical records. "I want a to speak to the company lawyer, every company has a lawyer" Client very demanding and threatening. "I know the law and I have written a request every since 3 o'clock and nothing has been done" A: Clinical research associateWriter provided emotional support by letting client know writer does not have the authority to release medical records, but she can speak with physician in the morning. Medications reviewed and administered as ordered. Client refused Trazodone, " it makes me alert, if had ask, I would have told him that" Writer noted that it wasn't listed in her allergies. Staff will monitor q4215min for safety. R: Client is safe on the unit.

## 2016-10-05 MED ORDER — GABAPENTIN 400 MG PO CAPS
800.0000 mg | ORAL_CAPSULE | Freq: Three times a day (TID) | ORAL | Status: DC
  Administered 2016-10-05 – 2016-10-06 (×4): 800 mg via ORAL
  Filled 2016-10-05 (×2): qty 2
  Filled 2016-10-05 (×2): qty 56
  Filled 2016-10-05 (×4): qty 2
  Filled 2016-10-05: qty 56
  Filled 2016-10-05 (×2): qty 2
  Filled 2016-10-05: qty 56

## 2016-10-05 MED ORDER — RAMELTEON 8 MG PO TABS
8.0000 mg | ORAL_TABLET | Freq: Every day | ORAL | Status: DC
  Administered 2016-10-05: 8 mg via ORAL
  Filled 2016-10-05: qty 1
  Filled 2016-10-05: qty 7
  Filled 2016-10-05: qty 1

## 2016-10-05 MED ORDER — QUETIAPINE FUMARATE 400 MG PO TABS
400.0000 mg | ORAL_TABLET | Freq: Every day | ORAL | Status: DC
  Administered 2016-10-05: 400 mg via ORAL
  Filled 2016-10-05 (×2): qty 1
  Filled 2016-10-05: qty 7

## 2016-10-05 MED ORDER — QUETIAPINE FUMARATE 100 MG PO TABS
100.0000 mg | ORAL_TABLET | Freq: Two times a day (BID) | ORAL | Status: DC
  Administered 2016-10-05 – 2016-10-06 (×3): 100 mg via ORAL
  Filled 2016-10-05 (×8): qty 1

## 2016-10-05 NOTE — Progress Notes (Signed)
Patient attended group meeting.  

## 2016-10-05 NOTE — Progress Notes (Signed)
[facility] MD Progress Note  10/05/2016 11:01 AM Berlyn Saylor  MRN:  [id number] Subjective:  "I'm not happy with my meds. 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 I wanted my remeron, more ativan, and 900mg  daily seroquel and double my synthroid."   Objective: Pt seen and chart reviewed. Pt is alert/oriented x4, calm, cooperative, and appropriate to situation. Pt denies suicidal/homicidal ideation and psychosis and does not appear to be responding to internal stimuli. Pt is slightly more calm today and sat down when speaking to me. Yesterday she was pacing and had to be reminded to sit down and let her broken foot rest. Pt was upset today asking for many medications as mentioned above. Agreed to increase Seroquel slightly, modify her neurontin for mood control, and add Rozerem for sleep instead of Trazodone. I informed her that we will not be increasing or changing her Ativan, that this is part of a protocol and that we try not to give this for sleep as she is requesting. We had a long talk about weight gain on Remeron/Seroquel together and pt affirmed understanding about why we are not prescribing both together.   *Later, pt was upset and wanted to meet with me again and then demanding to speak to the administrators about her medications.   Principal Problem: Alcohol abuse Diagnosis:   Patient Active Problem List   Diagnosis Date Noted  . Bipolar 2 disorder (HCC) [F31.81] 10/04/2016    Priority: High  . Alcohol abuse [F10.10] 10/02/2016    Priority: High   Total Time spent with patient: 15 minutes  Past Psychiatric History: ETOH, PTSD, insomnia, depression, GAD  Past Medical History:  Past Medical History:  Diagnosis Date  . PTSD (post-traumatic stress disorder)    History reviewed. No pertinent surgical history. Family History: History reviewed. No pertinent family history. Family Psychiatric  History: denies Social History:  History  Alcohol Use  . Yes    Comment: binges 7-8 drinks  until blackout     History  Drug Use No    Social History   Social History  . Marital status: Single    Spouse name: N/A  . Number of children: N/A  . Years of education: N/A   Social History Main Topics  . Smoking status: Current Every Day Smoker    Packs/day: 0.50  . Smokeless tobacco: Never Used  . Alcohol use Yes     Comment: binges 7-8 drinks until blackout  . Drug use: No  . Sexual activity: No   Other Topics Concern  . None   Social History Narrative  . None   Additional Social History:                         Sleep: Poor  Appetite:  Fair  Current Medications: Current Facility-Administered Medications  Medication Dose Route Frequency Provider Last Rate Last Dose  . acetaminophen (TYLENOL) tablet 650 mg  650 mg Oral Q6H PRN Beau Fanny, FNP   650 mg at 10/04/16 1521  . alum & mag hydroxide-simeth (MAALOX/MYLANTA) 200-200-20 MG/5ML suspension 30 mL  30 mL Oral Q4H PRN Beau Fanny, FNP   30 mL at 10/03/16 1812  . gabapentin (NEURONTIN) capsule 800 mg  800 mg Oral TID WC & HS Beau Fanny, FNP      . hydrOXYzine (ATARAX/VISTARIL) tablet 25 mg  25 mg Oral Q6H PRN Neysa Hotter, MD   25 mg at 10/03/16 2306  . levothyroxine (SYNTHROID,  LEVOTHROID) tablet 25 mcg  25 mcg Oral QAC breakfast Beau FannyJohn C Daiveon Markman, FNP   25 mcg at 10/05/16 [id number]  . loperamide (IMODIUM) capsule 2-4 mg  2-4 mg Oral PRN Neysa Hottereina Hisada, MD      . LORazepam (ATIVAN) tablet 1 mg  1 mg Oral Q6H PRN Neysa Hottereina Hisada, MD   1 mg at 10/04/16 2135  . LORazepam (ATIVAN) tablet 1 mg  1 mg Oral BID Neysa Hottereina Hisada, MD       Followed by  . [START ON 10/07/2016] LORazepam (ATIVAN) tablet 1 mg  1 mg Oral Daily Neysa Hottereina Hisada, MD      . magnesium hydroxide (MILK OF MAGNESIA) suspension 30 mL  30 mL Oral Daily PRN Beau FannyJohn C Javayah Magaw, FNP      . multivitamin with minerals tablet 1 tablet  1 tablet Oral Daily Neysa Hottereina Hisada, MD   1 tablet at 10/05/16 0752  . ondansetron (ZOFRAN-ODT) disintegrating tablet 4 mg  4 mg  Oral Q6H PRN Neysa Hottereina Hisada, MD      . pantoprazole (PROTONIX) EC tablet 40 mg  40 mg Oral Daily Beau FannyJohn C Gizella Belleville, FNP   40 mg at 10/05/16 0753  . QUEtiapine (SEROQUEL) tablet 100 mg  100 mg Oral BID Beau FannyJohn C Uchenna Seufert, FNP      . QUEtiapine (SEROQUEL) tablet 400 mg  400 mg Oral QHS Beau FannyJohn C Tayla Panozzo, FNP      . ramelteon (ROZEREM) tablet 8 mg  8 mg Oral QHS Melissaann Dizdarevic C Dhanush Jokerst, FNP      . thiamine (B-1) injection 100 mg  100 mg Intravenous Daily Everardo AllJohn C Twyla Dais, FNP      . thiamine (VITAMIN B-1) tablet 100 mg  100 mg Oral Daily Neysa Hottereina Hisada, MD   100 mg at 10/05/16 [id number]    Lab Results:  No results found for this or any previous visit (from the past 48 hour(s)).  Blood Alcohol level:  Lab Results  Component Value Date   ETH 266 (H) 10/01/2016    Metabolic Disorder Labs: Lab Results  Component Value Date   HGBA1C 5.4 10/03/2016   MPG 108 10/03/2016   Lab Results  Component Value Date   PROLACTIN 21.6 10/03/2016   Lab Results  Component Value Date   CHOL 208 (H) 10/03/2016   TRIG 46 10/03/2016   HDL 128 10/03/2016   CHOLHDL 1.6 10/03/2016   VLDL 9 10/03/2016   LDLCALC 71 10/03/2016    Physical Findings: AIMS: Facial and Oral Movements Muscles of Facial Expression: None, normal Lips and Perioral Area: None, normal Jaw: None, normal Tongue: None, normal,Extremity Movements Upper (arms, wrists, hands, fingers): None, normal Lower (legs, knees, ankles, toes): None, normal, Trunk Movements Neck, shoulders, hips: None, normal, Overall Severity Severity of abnormal movements (highest score from questions above): None, normal Incapacitation due to abnormal movements: None, normal Patient's awareness of abnormal movements (rate only patient's report): No Awareness, Dental Status Current problems with teeth and/or dentures?: No Does patient usually wear dentures?: No  CIWA:  CIWA-Ar Total: 2 COWS:  COWS Total Score: 2  Musculoskeletal: Strength & Muscle Tone: within normal limits Gait &  Station: normal Patient leans: N/A  Psychiatric Specialty Exam: Physical Exam  Review of Systems  Psychiatric/Behavioral: Positive for depression and substance abuse. The patient is nervous/anxious and has insomnia.   All other systems reviewed and are negative.   Blood pressure 106/60, pulse 87, temperature 97.9 F (36.6 C), temperature source Oral, resp. rate 16, height 5\' 6"  (1.676 m), weight 64.4 kg (  142 lb), last menstrual period 09/01/2016, SpO2 100 %, unknown if currently breastfeeding.Body mass index is 22.92 kg/m.  General Appearance: Bizarre yet improving  Eye Contact:  Good  Speech:  Clear and Coherent and Pressured slightly less pressured  Volume:  Increased yet improving  Mood:  Anxious and agitated  Affect:  Appropriate and Congruent  Thought Process:  Coherent, Goal Directed and Descriptions of Associations: Intact  Orientation:  Full (Time, Place, and Person)  Thought Content:  Focused on meds, focused on getting her pan handling prices  Suicidal Thoughts:  No  Homicidal Thoughts:  No  Memory:  Immediate;   Fair Recent;   Fair Remote;   Fair  Judgement:  Fair  Insight:  Fair  Psychomotor Activity:  Normal  Concentration:  Concentration: Fair and Attention Span: Fair  Recall:  [employer]  Fund of Knowledge:  Fair  Language:  Fair  Akathisia:  No  Handed:    AIMS (if indicated):     Assets:  Communication Skills Desire for Improvement Resilience Social Support  ADL's:  Intact  Cognition:  WNL  Sleep:  Number of Hours: 6.5   Treatment Plan Summary: Alcohol abuse with alcohol induced mood disorder with bipolar 2, manic, unstable, treated as below:   Medications:   -Modify Seroquel to 100mg  po bid and 400mg  po qhs for psychosis/mania  -continue Ativan/CIWA protocol -Modify home Neurontin 800mg  po tid (add qhs also) for anxiety and neuropathy and mood stabilization -EKG reviewed and Qtc WNL on 10/05/16 -continue protonix 40mg  po daily for GERD -Add Rozerem  8mg  qhs for insomnia   Beau FannyWithrow, Christle Nolting C, FNP 10/05/2016, 11:01 AM

## 2016-10-05 NOTE — [facility] Group Notes (Signed)
[facility] LCSW Group Therapy  10/05/2016 2:03 PM  Type of Therapy:  Group Therapy  Participation Level:  Active  Participation Quality:  Attentive  Affect:  Appropriate  Cognitive:  Oriented  Insight:  Improving  Engagement in Therapy:  Improving  Modes of Intervention:  Discussion, Education, Exploration, Problem-solving, Rapport Building, Socialization and Support  Summary of Progress/Problems: [facility] Speaker came to talk about his personal journey with substance abuse and addiction. The pt processed ways by which to relate to the speaker. [facility] speaker provided handouts and educational information pertaining to groups and services offered by the [facility].   Marciel Offenberger N Smart LCSW 10/05/2016, 2:03 PM

## 2016-10-05 NOTE — Progress Notes (Signed)
Pt has been hypomanic and intrusive at the nurses station and [facility] to [facility] today. Pt was upset about her medications this morning asking for different doses and talking rapidly. 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  Pt refused prn medication for pain when offered.  A:Offered support, encouragement and 15 minute checks.  R:[facility] with [facility]. Pt is currently talking with her CM wanting to leave tomorrow saying that she has an appointment about pan handling that she can not miss.

## 2016-10-05 NOTE — Progress Notes (Signed)
Patient ID: Mary CharterCheryl Nass, female   DOB: May 11, 1966, 50 y.o.   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 "thing better now that I have an advocate and come to an understanding with the doctor"  Client is less intrusive but still insists her medications will not be enough for her to sleep.  A: Writer provided emotional support, encouraged client to go relax in her room and give medications and opportunity to work. Staff will monitor q5115min for safety. R: Client is safe on the unit, attended karaoke and sang.

## 2016-10-05 NOTE — Plan of Care (Signed)
Problem: Safety: Goal: Periods of time without injury will increase Outcome: Progressing Periods of time without injury will increase AEB implementation of fall/safety risk, room close to nurses station, and

## 2016-10-06 DIAGNOSIS — F1010 Alcohol abuse, uncomplicated: Secondary | ICD-10-CM

## 2016-10-06 DIAGNOSIS — F1020 Alcohol dependence, uncomplicated: Secondary | ICD-10-CM | POA: Clinically undetermined

## 2016-10-06 DIAGNOSIS — F17210 Nicotine dependence, cigarettes, uncomplicated: Secondary | ICD-10-CM

## 2016-10-06 DIAGNOSIS — F312 Bipolar disorder, current episode manic severe with psychotic features: Secondary | ICD-10-CM | POA: Diagnosis present

## 2016-10-06 MED ORDER — QUETIAPINE FUMARATE 400 MG PO TABS: 400.0000 mg | ORAL_TABLET | Freq: Every day | ORAL | 0 refills | Status: DC

## 2016-10-06 MED ORDER — QUETIAPINE FUMARATE 100 MG PO TABS: 100.0000 mg | ORAL_TABLET | Freq: Two times a day (BID) | ORAL | 0 refills | Status: DC

## 2016-10-06 MED ORDER — PANTOPRAZOLE SODIUM 40 MG PO TBEC: 40.0000 mg | DELAYED_RELEASE_TABLET | Freq: Every day | ORAL | 0 refills | Status: DC

## 2016-10-06 MED ORDER — RAMELTEON 8 MG PO TABS: 8.0000 mg | ORAL_TABLET | Freq: Every day | ORAL | 0 refills | Status: DC

## 2016-10-06 MED ORDER — GABAPENTIN 400 MG PO CAPS: 800.0000 mg | ORAL_CAPSULE | Freq: Three times a day (TID) | ORAL | 0 refills | Status: DC

## 2016-10-06 MED ORDER — LEVOTHYROXINE SODIUM 25 MCG PO TABS: 25.0000 ug | ORAL_TABLET | Freq: Every day | ORAL | 0 refills | Status: DC
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
## 2016-10-06 NOTE — Progress Notes (Signed)
Recreation Therapy Notes  Date: 10/06/16 Time: 0930 Location: 300 [address] Dayroom  Group Topic: Stress Management  Goal Area(s) Addresses:  Patient will verbalize importance of using healthy stress management.  Patient will identify positive emotions associated with healthy stress management.   Behavioral Response: Engaged  Intervention: Stress Management  Activity :  Progressive Muscle Relaxation.  LRT introduced the stress management technique of progressive muscle relaxation.  LRT read a script to patients could partake in the activity.  Patients were to follow along as LRT read script.  Education:  Stress Management, Discharge Planning.   Education Outcome: Acknowledges edcuation/In group clarification offered/Needs additional education  Clinical Observations/Feedback: Pt attended group.   Caroll RancherMarjette Zainah Steven, LRT/CTRS        Caroll RancherLindsay, Cheril Slattery A 10/06/2016 12:22 PM

## 2016-10-06 NOTE — Progress Notes (Signed)
  [facility] :  Will you be returning to the same living situation after discharge:  Yes,  returning to motel At discharge, do you have transportation home?: Yes,  bus passes in chart Do you have the ability to pay for your medications: Yes,  mental health  Release of information consent forms completed and submitted to medical records by CSW.  Patient to Follow up at: Follow-up Information    [facility] Follow up.   Specialty:  Behavioral Health Why:  Walk in between 8am-9am Monday through Friday for hospital follow-up/medication management/assessment for counseling services. Please follow-up within 7 days after hospital discharge. Thank you.  Contact information: [address] [place] [address] [phone]           Next level of care provider has [facility] Link:no  Safety Planning and Suicide Prevention discussed: Yes,  SPE completed with pt; pt declined to consent to family contact.    Has patient been referred to the Quitline?: Patient refused referral  Patient has been referred for addiction treatment: Yes  Concetta Guion N Smart LCSW 10/06/2016, 10:12 AM

## 2016-10-06 NOTE — [facility] Suicide Risk Assessment (Signed)
[facility]   Principal Problem: Bipolar disorder, current episode manic severe with psychotic features [facility]HCC) Discharge Diagnoses:  Patient Active Problem List   Diagnosis Date Noted  . Bipolar disorder, current episode manic severe with psychotic features (HCC) [F31.2] 10/06/2016  . Alcohol use disorder, severe, dependence (HCC) [F10.20] 10/06/2016    Total Time spent with patient: 30 minutes  Musculoskeletal: Strength & Muscle Tone: within normal limits Gait & Station: normal Patient leans: N/A  Psychiatric Specialty Exam: Review of Systems  Psychiatric/Behavioral: Positive for substance abuse. Negative for depression and suicidal ideas.  All other systems reviewed and are negative.   Blood pressure 118/81, pulse 72, temperature 97.8 F (36.6 C), resp. rate 18, height 5\' 6"  (1.676 m), weight 64.4 kg (142 lb), last menstrual period 09/01/2016, SpO2 100 %, unknown if currently breastfeeding.Body mass index is 22.92 kg/m.  General Appearance: Casual  Eye Contact::  Fair  Speech:  Clear and Coherent409  Volume:  Normal  Mood:  Euthymic  Affect:  Appropriate  Thought Process:  Goal Directed and Descriptions of Associations: Intact  Orientation:  Full (Time, Place, and Person)  Thought Content:  Logical  Suicidal Thoughts:  No  Homicidal Thoughts:  No  Memory:  Immediate;   Fair Recent;   Fair Remote;   Fair  Judgement:  Fair  Insight:  Fair  Psychomotor Activity:  Normal  Concentration:  Fair  Recall:  [employer]  Fund of Knowledge:Fair  Language: Fair  Akathisia:  No  Handed:  Right  AIMS (if indicated):     Assets:  Communication Skills Desire for Improvement  Sleep:  Number of Hours: 5.75  Cognition: WNL  ADL's:  Intact   Mental Status Per Nursing Assessment::   On Admission:     Demographic Factors:  Caucasian  Loss Factors: NA  Historical Factors: Impulsivity  Risk Reduction Factors:   Positive therapeutic  relationship  Continued Clinical Symptoms:  Alcohol/Substance Abuse/Dependencies Previous Psychiatric Diagnoses and Treatments  Cognitive Features That Contribute To Risk:  None    Suicide Risk:  Minimal: No identifiable suicidal ideation.  Patients presenting with no risk factors but with morbid ruminations; may be classified as minimal risk based on the severity of the depressive symptoms  Follow-up Information    [facility]   Specialty:  Behavioral Health Why:  Walk in between 8am-9am Monday through Friday for hospital follow-up/medication management/assessment for counseling services. Please follow-up within 7 days after hospital discharge. Thank you.  Contact information: [address] [place] [address] [phone]           Plan Of Care/Follow-up recommendations:  Activity:  no restrictions Diet:  regular Other:  none  [name], MD 10/06/2016, 10:37 AM

## 2016-10-06 NOTE — Tx Team (Signed)
Interdisciplinary Treatment and Diagnostic Plan Update  10/06/2016 Time of Session: 9:30AM Anisa Leanos MRN: [id number]  Principal Diagnosis: Alcohol abuse  Secondary Diagnoses: Principal Problem:   Alcohol abuse Active Problems:   Bipolar 2 disorder ([place])   Current Medications:  Current Facility-Administered Medications  Medication Dose Route Frequency Provider Last Rate Last Dose  . acetaminophen (TYLENOL) tablet 650 mg  650 mg Oral Q6H PRN Benjamine Mola, FNP   650 mg at 10/05/16 1504  . alum & mag hydroxide-simeth (MAALOX/MYLANTA) 200-200-20 MG/5ML suspension 30 mL  30 mL Oral Q4H PRN Benjamine Mola, FNP   30 mL at 10/05/16 1506  . gabapentin (NEURONTIN) capsule 800 mg  800 mg Oral TID WC & HS Benjamine Mola, FNP   800 mg at 10/06/16 7782  . levothyroxine (SYNTHROID, LEVOTHROID) tablet 25 mcg  25 mcg Oral QAC breakfast Benjamine Mola, FNP   25 mcg at 10/06/16 4235  . [START ON 10/07/2016] LORazepam (ATIVAN) tablet 1 mg  1 mg Oral Daily Norman Clay, MD      . magnesium hydroxide (MILK OF MAGNESIA) suspension 30 mL  30 mL Oral Daily PRN Benjamine Mola, FNP      . multivitamin with minerals tablet 1 tablet  1 tablet Oral Daily Norman Clay, MD   1 tablet at 10/06/16 [phone]  . pantoprazole (PROTONIX) EC tablet 40 mg  40 mg Oral Daily Benjamine Mola, FNP   40 mg at 10/06/16 4315  . QUEtiapine (SEROQUEL) tablet 100 mg  100 mg Oral BID Benjamine Mola, FNP   100 mg at 10/06/16 4008  . QUEtiapine (SEROQUEL) tablet 400 mg  400 mg Oral QHS Benjamine Mola, FNP   400 mg at 10/05/16 2206  . ramelteon (ROZEREM) tablet 8 mg  8 mg Oral QHS Benjamine Mola, FNP   8 mg at 10/05/16 2206  . thiamine (B-1) injection 100 mg  100 mg Intravenous Daily Benjamine Mola, FNP      . thiamine (VITAMIN B-1) tablet 100 mg  100 mg Oral Daily Norman Clay, MD   100 mg at 10/06/16 6761   PTA Medications: Prescriptions Prior to Admission  Medication Sig Dispense Refill Last Dose  . gabapentin (NEURONTIN) 800 MG  tablet Take 800 mg by mouth 3 (three) times daily.   Past Week at Unknown time  . pantoprazole (PROTONIX) 40 MG tablet Take 40 mg by mouth daily.   Past Week at Unknown time  . promethazine (PHENERGAN) 12.5 MG tablet Take 12.5 mg by mouth every 6 (six) hours as needed for nausea or vomiting.   Past Week at Unknown time  . QUEtiapine (SEROQUEL) 300 MG tablet Take 300 mg by mouth 3 (three) times daily.   Past Week at Unknown time    Patient Stressors: Financial difficulties Health problems Substance abuse  Patient Strengths: Network engineer for treatment/growth  Treatment Modalities: Medication Management, Group therapy, Case management,  1 to 1 session with clinician, Psychoeducation, Recreational therapy.   Physician Treatment Plan for Primary Diagnosis: Alcohol abuse Long Term Goal(s): Improvement in symptoms so as ready for discharge Improvement in symptoms so as ready for discharge   Short Term Goals: Ability to identify changes in lifestyle to reduce recurrence of condition will improve Ability to identify and develop effective coping behaviors will improve Ability to identify changes in lifestyle to reduce recurrence of condition will improve Ability to demonstrate self-control will improve Ability to identify and develop effective  coping behaviors will improve  Medication Management: Evaluate patient's response, side effects, and tolerance of medication regimen.  Therapeutic Interventions: 1 to 1 sessions, Unit Group sessions and Medication administration.  Evaluation of Outcomes: Met  Physician Treatment Plan for Secondary Diagnosis: Principal Problem:   Alcohol abuse Active Problems:   Bipolar 2 disorder ([place])  Long Term Goal(s): Improvement in symptoms so as ready for discharge Improvement in symptoms so as ready for discharge   Short Term Goals: Ability to identify changes in lifestyle to reduce recurrence of condition will  improve Ability to identify and develop effective coping behaviors will improve Ability to identify changes in lifestyle to reduce recurrence of condition will improve Ability to demonstrate self-control will improve Ability to identify and develop effective coping behaviors will improve     Medication Management: Evaluate patient's response, side effects, and tolerance of medication regimen.  Therapeutic Interventions: 1 to 1 sessions, Unit Group sessions and Medication administration.  Evaluation of Outcomes: Met   RN Treatment Plan for Primary Diagnosis: Alcohol abuse Long Term Goal(s): Knowledge of disease and therapeutic regimen to maintain health will improve  Short Term Goals: Ability to remain free from injury will improve, Ability to disclose and discuss suicidal ideas and Ability to identify and develop effective coping behaviors will improve  Medication Management: RN will administer medications as ordered by provider, will assess and evaluate patient's response and provide education to patient for prescribed medication. RN will report any adverse and/or side effects to prescribing provider.  Therapeutic Interventions: 1 on 1 counseling sessions, Psychoeducation, Medication administration, Evaluate responses to treatment, Monitor vital signs and CBGs as ordered, Perform/monitor CIWA, COWS, AIMS and Fall Risk screenings as ordered, Perform wound care treatments as ordered.  Evaluation of Outcomes: Met   LCSW Treatment Plan for Primary Diagnosis: Alcohol abuse Long Term Goal(s): Safe transition to appropriate next level of care at discharge, Engage patient in therapeutic group addressing interpersonal concerns.  Short Term Goals: Engage patient in aftercare planning with referrals and resources, Facilitate patient progression through stages of change regarding substance use diagnoses and concerns and Identify triggers associated with mental health/substance abuse  issues  Therapeutic Interventions: Assess for all discharge needs, 1 to 1 time with Social worker, Explore available resources and support systems, Assess for adequacy in community support network, Educate family and significant other(s) on suicide prevention, Complete Psychosocial Assessment, Interpersonal group therapy.  Evaluation of Outcomes: Met   Progress in Treatment: Attending groups: Yes Participating in groups: Yes Taking medication as prescribed: Yes. Toleration medication: Yes. Family/Significant other contact made: SPE completed with pt; pt declined to consent to family contact.  Patient understands diagnosis: Yes. Discussing patient identified problems/goals with staff: No. Medical problems stabilized or resolved: No. Denies suicidal/homicidal ideation: Yes. self report.  Issues/concerns per patient self-inventory: No. Other: n/a   New problem(s) identified: No, Describe:  n/a  New Short Term/Long Term Goal(s): medication stabilization; detox; development of comprehensive mental wellness/sobriety plan.   Discharge Plan or Barriers:  Pt plans to return to motel and will follow-up at [facility] for outpatient services. Pt given extensive AA list for [place] information. She is interested in peer support counseling through that agency and plans to pursue that at discharge. Pt given bus passes and she declined additional referrals for inpatient treatment.   Reason for Continuation of Hospitalization: none  Estimated Length of Stay: d/c today  Attendees: Patient: 10/06/2016 10:13 AM  Physician: Dr. Shea Evans MD 10/06/2016 10:13 AM  Nursing:  [address] Verlene Mayer RN 10/06/2016 10:13 AM  RN Care Manager: Lars Pinks CM 10/06/2016 10:13 AM  Social Worker: Maxie Better, LCSW; Adriana Reams LCSW 10/06/2016 10:13 AM  Recreational Therapist:  10/06/2016 10:13 AM  Other: Samuel Jester NP; Ricky Ala NP 10/06/2016 10:13 AM  Other:  10/06/2016 10:13  AM  Other: 10/06/2016 10:13 AM    Scribe for Treatment Team: [place], LCSW 10/06/2016 10:13 AM

## 2016-10-06 NOTE — Progress Notes (Signed)
Pt d/c with a bus pass. All items returned. D/C instructions given, prescriptions given and samples given. Pt denies si and hi. 

## 2016-10-10 ENCOUNTER — Encounter (HOSPITAL_COMMUNITY): Payer: Self-pay | Admitting: Emergency Medicine

## 2016-10-27 NOTE — Discharge Summary (Signed)
DISCHARGE SUMMARY    Date of admit: 09/19/2016  9:45 PM Date of discharge: 09/20/2016 12:35 PM Length of Stay: 1 days  PCP is No primary care provider on file.   PROBLEM LIST Active Problems:   Acute encephalopathy Hypothermia Acute ethanol intoxication    SUMMARY Mary Dodson was [age] y.o. patient with    has no past medical history on file.   has no past surgical history on file.   Admitted on 09/19/2016 with after being found in a parking lot hypothermic, unresponsive and smelling of ethanol. Intubated. Following day she was awake and extubated upon demand. Post extubation she signed herself out against medical advice  Dr. Kalman ShanMurali Page Lancon, M.D., [facility]P Pulmonary and Critical Care Medicine Staff Physician [facility]  Pulmonary and Critical Care Pager: [phone], If no answer or between  15:00h - 7:00h: call [phone]  10/27/2016 8:08 PM

## 2016-10-30 ENCOUNTER — Encounter (HOSPITAL_COMMUNITY): Payer: Self-pay | Admitting: Emergency Medicine

## 2017-11-05 ENCOUNTER — Telehealth (HOSPITAL_COMMUNITY): Payer: Self-pay

## 2017-11-12 NOTE — Telephone Encounter (Signed)
[facility]. No answer or return call.

## 2018-01-18 ENCOUNTER — Other Ambulatory Visit: Payer: Self-pay | Admitting: *Deleted

## 2018-01-18 DIAGNOSIS — Z1231 Encounter for screening mammogram for malignant neoplasm of breast: Secondary | ICD-10-CM

## 2018-02-06 ENCOUNTER — Encounter (HOSPITAL_COMMUNITY): Payer: Self-pay | Admitting: *Deleted

## 2018-02-06 ENCOUNTER — Other Ambulatory Visit: Payer: Self-pay | Admitting: Obstetrics and Gynecology

## 2018-02-06 DIAGNOSIS — Z1231 Encounter for screening mammogram for malignant neoplasm of breast: Secondary | ICD-10-CM

## 2018-03-07 ENCOUNTER — Ambulatory Visit (HOSPITAL_COMMUNITY)
Admission: RE | Admit: 2018-03-07 | Discharge: 2018-03-07 | Disposition: A | Discharge status: Home / Self Care | Payer: Self-pay | Source: Ambulatory Visit | Attending: Obstetrics and Gynecology | Admitting: Obstetrics and Gynecology

## 2018-03-07 ENCOUNTER — Encounter (HOSPITAL_COMMUNITY): Payer: Self-pay

## 2018-03-07 ENCOUNTER — Ambulatory Visit
Admission: RE | Admit: 2018-03-07 | Discharge: 2018-03-07 | Disposition: A | Discharge status: Home / Self Care | Payer: No Typology Code available for payment source | Source: Ambulatory Visit | Attending: Obstetrics and Gynecology | Admitting: Obstetrics and Gynecology

## 2018-03-07 VITALS — BP 115/60 | Ht 66.0 in

## 2018-03-07 DIAGNOSIS — Z01419 Encounter for gynecological examination (general) (routine) without abnormal findings: Secondary | ICD-10-CM

## 2018-03-07 DIAGNOSIS — Z1231 Encounter for screening mammogram for malignant neoplasm of breast: Secondary | ICD-10-CM

## 2018-03-07 NOTE — Patient Instructions (Signed)
Explained breast self awareness with [name]. Let patient know that follow-up from today's Pap smear will be based on the result. Referred patient to the [facility] of [facility] for a screening mammogram. Appointment scheduled for Thursday, Mar 07, 2018 at 1310. Let patient know will follow up with her within the next couple weeks with results of Pap smear by letter or phone. Explained to patient that will refer her to the Center for [facility] at [facility] to follow-up on the bumps observed in her vaginal area. Let patient know that it would not be covered by [facility] and that she will need to complete financial assistance Paperwork.  Informed patient that the Breast Center will follow-up with her within the next couple of weeks with results of mammogram by letter or phone. Discussed smoking cessation with patient. Referred to the [facility] Quitline and gave resources to free smoking cessation classes at [facility]. Sharonna Vinje verbalized understanding.  Therisa Mennella, Kathaleen Maser, RN 4:43 PM

## 2018-03-07 NOTE — Progress Notes (Signed)
Complaints of bump on rectal area.  Pap Smear: Pap smear completed today. Last Pap smear was 08/01/2017 at the [facility] and ASCUS. Per patient she has had three other abnormal Pap smears when she was 22, 36, and 45. Patient states she thinks she had a colposcopy to follow-up after the abnormal Pap smear when she was 45. No follow-up was completed after the last abnormal Pap smear. Last Pap smear result is in Epic.  Physical exam: Breasts Breasts symmetrical. No skin abnormalities bilateral breasts. No nipple retraction bilateral breasts. No nipple discharge bilateral breasts. No lymphadenopathy. No lumps palpated bilateral breasts. No complaints of pain or tenderness on exam. Referred patient to the [facility] of [facility] for a screening mammogram. Appointment scheduled for Thursday, Mar 07, 2018 at 1310.        Pelvic/Bimanual   Ext Genitalia Three small bumps observed to left of vaginal opening. No swelling and no discharge observed on external genitalia. Will refer patient to the Center for [facility] for follow-up.          Vagina Vagina pink and normal texture. No lesions or discharge observed in vagina.          Cervix Cervix is present. Cervix pink and of normal texture. No discharge observed.     Uterus Uterus is present and palpable. Uterus in normal position and normal size.        Adnexae Bilateral ovaries present and palpable. No tenderness on palpation.         Rectovaginal No rectal exam completed today since patient had no rectal complaints. Observed a hemorrhoid and a small bump near anal opening.    Smoking History: Patient is a current smoker. Discussed smoking cessation with patient. Referred to the [facility] Quitline and gave resources to free smoking cessation classes at [facility].  Patient Navigation: Patient education provided. Access to services provided for patient through [facility] program.   Colorectal Cancer Screening: Per patient had  a colonoscopy completed in 2017. No complaints today. FIT Test given to patient to complete and return to [facility].  Breast and Cervical Cancer Risk Assessment: Patient has a family history of her maternal grandmother having breast cancer. Patient has no known genetic mutations or history of  radiation treatment to the chest before age 47. Per patient has a history of cervical dysplasia. Patient has no history of being immunocompromised, or DES exposure in-utero. Patient has a 5-year risk of breast cancer at 2% and a lifetime risk at 17%.

## 2018-03-08 ENCOUNTER — Other Ambulatory Visit: Payer: Self-pay

## 2018-03-08 LAB — CYTOLOGY - PAP
DIAGNOSIS: UNDETERMINED — AB
HPV (WINDOPATH): NOT DETECTED

## 2018-03-13 LAB — FECAL OCCULT BLOOD, IMMUNOCHEMICAL: FECAL OCCULT BLD: NEGATIVE

## 2018-03-14 ENCOUNTER — Encounter: Payer: Self-pay | Admitting: Family Medicine

## 2018-03-19 ENCOUNTER — Encounter: Payer: Self-pay | Admitting: Family Medicine

## 2018-03-19 ENCOUNTER — Ambulatory Visit: Payer: Self-pay | Attending: Family Medicine | Admitting: Family Medicine

## 2018-03-19 VITALS — BP 89/53 | HR 68 | Temp 97.2°F | Wt 162.2 lb

## 2018-03-19 DIAGNOSIS — L299 Pruritus, unspecified: Secondary | ICD-10-CM

## 2018-03-19 DIAGNOSIS — E039 Hypothyroidism, unspecified: Secondary | ICD-10-CM | POA: Insufficient documentation

## 2018-03-19 DIAGNOSIS — Z885 Allergy status to narcotic agent status: Secondary | ICD-10-CM | POA: Insufficient documentation

## 2018-03-19 DIAGNOSIS — K648 Other hemorrhoids: Secondary | ICD-10-CM

## 2018-03-19 DIAGNOSIS — F4310 Post-traumatic stress disorder, unspecified: Secondary | ICD-10-CM | POA: Insufficient documentation

## 2018-03-19 DIAGNOSIS — R87610 Atypical squamous cells of undetermined significance on cytologic smear of cervix (ASC-US): Secondary | ICD-10-CM

## 2018-03-19 DIAGNOSIS — Z79899 Other long term (current) drug therapy: Secondary | ICD-10-CM | POA: Insufficient documentation

## 2018-03-19 DIAGNOSIS — B9681 Helicobacter pylori [H. pylori] as the cause of diseases classified elsewhere: Secondary | ICD-10-CM

## 2018-03-19 DIAGNOSIS — K2970 Gastritis, unspecified, without bleeding: Secondary | ICD-10-CM

## 2018-03-19 DIAGNOSIS — F312 Bipolar disorder, current episode manic severe with psychotic features: Secondary | ICD-10-CM

## 2018-03-19 DIAGNOSIS — E038 Other specified hypothyroidism: Secondary | ICD-10-CM

## 2018-03-19 MED ORDER — PANTOPRAZOLE SODIUM 40 MG PO TBEC: 40.0000 mg | DELAYED_RELEASE_TABLET | Freq: Every day | ORAL | 3 refills | Status: DC

## 2018-03-19 MED ORDER — HYDROCORTISONE ACE-PRAMOXINE 1-1 % RE CREA: 1.0000 "application " | TOPICAL_CREAM | Freq: Two times a day (BID) | RECTAL | 0 refills | Status: DC

## 2018-03-19 MED ORDER — HYDROXYZINE HCL 10 MG PO TABS: 10.0000 mg | ORAL_TABLET | Freq: Two times a day (BID) | ORAL | 0 refills | Status: DC | PRN

## 2018-03-19 MED ORDER — POLYETHYLENE GLYCOL 3350 17 GM/SCOOP PO POWD: 17.0000 g | Freq: Two times a day (BID) | ORAL | 1 refills | Status: DC | PRN

## 2018-03-19 NOTE — Patient Instructions (Signed)
Hemorrhoids    Hemorrhoids are swollen veins in and around the rectum or anus. Hemorrhoids can cause pain, itching, or bleeding. Most of the time, they do not cause serious problems. They usually get better with diet changes, lifestyle changes, and other home treatments.  Follow these instructions at home:  Eating and drinking  · Eat foods that have fiber, such as whole grains, beans, nuts, fruits, and vegetables. Ask your doctor about taking products that have added fiber (fiber supplements).  · Drink enough fluid to keep your pee (urine) clear or pale yellow.  For Pain and Swelling  · Take a warm-water bath (sitz bath) for 20 minutes to ease pain. Do this 3-4 times a day.  · If directed, put ice on the painful area. It may be helpful to use ice between your warm baths.  ¨ Put ice in a plastic bag.  ¨ Place a towel between your skin and the bag.  ¨ Leave the ice on for 20 minutes, 2-3 times a day.  General instructions  · Take over-the-counter and prescription medicines only as told by your doctor.  ¨ Medicated creams and medicines that are inserted into the anus (suppositories) may be used or applied as told.  · Exercise often.  · Go to the bathroom when you have the urge to poop (to have a bowel movement). Do not wait.  · Avoid pushing too hard (straining) when you poop.  · Keep the butt area dry and clean. Use wet toilet paper or moist paper towels.  · Do not sit on the toilet for a long time.  Contact a doctor if:  · You have any of these:  ¨ Pain and swelling that do not get better with treatment or medicine.  ¨ Bleeding that will not stop.  ¨ Trouble pooping or you cannot poop.  ¨ Pain or swelling outside the area of the hemorrhoids.  This information is not intended to replace advice given to you by your health care provider. Make sure you discuss any questions you have with your health care provider.  Document Released: 07/04/2008 Document Revised: 03/02/2016 Document Reviewed: 06/09/2015  Elsevier  Interactive Patient Education © 2018 [employer]vier Inc.   

## 2018-03-19 NOTE — Progress Notes (Signed)
Subjective:  Patient ID: Mary Dodson, female    DOB: [birth year]  Age: 52 y.o. MRN: [id number]  CC: Establish Care   HPI Sharan Mcenaney is a 52 year old female with a history of bipolar disorder, PTSD, hypothyroidism who presents to the clinic to establish care with me as she was previously followed by [facility]. She informs me she recently had a Pap smear and does not know what the results mean and she shows me  the report from 'my chart' on her cell phone which reads ASCUS, HPV negative.  She also informed me she previously had an abnormal Pap and review of her chart indicates a Pap smear from 07/2017 which was ASCUS but no HPV testing was done.  She is yet to hear from the clinician who performed her Pap smear but also states she was informed she had polyps. She has a lesion around her anus which she is wondering if it could be warts as the lesion is itchy.  She is currently under the care of [facility] for Helicobacter pylori gastritis she informs me and like to have her care in one place.  Denies abdominal pain, nausea, vomiting or diarrhea.  Mental health care is received at [facility] and she usually receives vouchers for her medications which she states she has run out of.  Past Medical History:  Diagnosis Date  . PTSD (post-traumatic stress disorder)     History reviewed. No pertinent surgical history.  Allergies  Allergen Reactions  . Morphine And Related Other (See Comments)    "Makes me crazy"  . Morphine And Related Other (See Comments)    "It is bad"  . Morphine And Related   . Trazodone And Nefazodone Other (See Comments)     Outpatient Medications Prior to Visit  Medication Sig Dispense Refill  . benztropine (COGENTIN) 0.5 MG tablet Take 0.5 mg by mouth at bedtime.  0  . doxepin (SINEQUAN) 50 MG capsule Take 50 mg by mouth at bedtime.  0  . gabapentin (NEURONTIN) 400 MG capsule Take 2 capsules (800 mg total) by mouth 4 (four) times daily -  with meals and at  bedtime. 30 capsule 0  . gabapentin (NEURONTIN) 800 MG tablet Take 800 mg by mouth 3 (three) times daily.  0  . GABAPENTIN PO Take by mouth.    . levothyroxine (SYNTHROID, LEVOTHROID) 25 MCG tablet Take 1 tablet (25 mcg total) by mouth daily before breakfast. 30 tablet 0  . pantoprazole (PROTONIX) 40 MG tablet Take 40 mg by mouth daily.    . QUEtiapine (SEROQUEL) 100 MG tablet Take 1 tablet (100 mg total) by mouth 2 (two) times daily. 60 tablet 0  . QUEtiapine (SEROQUEL) 400 MG tablet Take 1 tablet (400 mg total) by mouth at bedtime. 30 tablet 0  . QUEtiapine Fumarate (SEROQUEL PO) Take by mouth.    . ramelteon (ROZEREM) 8 MG tablet Take 1 tablet (8 mg total) by mouth at bedtime. 30 tablet 0  . sertraline (ZOLOFT) 100 MG tablet Take 100 mg by mouth daily.    Marland Kitchen SYNTHROID 50 MCG tablet Take 50 mcg by mouth daily.  0  . pantoprazole (PROTONIX) 40 MG tablet Take 1 tablet (40 mg total) by mouth daily. 30 tablet 0  . chlordiazePOXIDE (LIBRIUM) 25 MG capsule 50mg  PO TID x 1D, then 25-50mg  PO BID X 1D, then 25-50mg  PO QD X 1D (Patient not taking: Reported on 03/07/2018) 10 capsule 0  . ferrous fumarate (HEMOCYTE - 106 MG  FE) 325 (106 Fe) MG TABS tablet Take 2 tablets by mouth daily.    Marland Kitchen. thiamine (VITAMIN B-1) 100 MG tablet Take 1 tablet (100 mg total) by mouth daily. (Patient not taking: Reported on 03/19/2018) 30 tablet 0   No facility-administered medications prior to visit.     ROS Review of Systems  Constitutional: Negative for activity change, appetite change and fatigue.  HENT: Negative for congestion, sinus pressure and sore throat.   Eyes: Negative for visual disturbance.  Respiratory: Negative for cough, chest tightness, shortness of breath and wheezing.   Cardiovascular: Negative for chest pain and palpitations.  Gastrointestinal: Negative for abdominal distention, abdominal pain and constipation.  Endocrine: Negative for polydipsia.  Genitourinary: Negative for dysuria and frequency.    Musculoskeletal: Negative for arthralgias and back pain.  Skin: Positive for rash.  Neurological: Negative for tremors, light-headedness and numbness.  Hematological: Does not bruise/bleed easily.  Psychiatric/Behavioral: Negative for agitation and behavioral problems.    Objective:  BP (!) 89/53   Pulse 68   Temp (!) 97.2 F (36.2 C) (Oral)   Wt 162 lb 3.2 oz (73.6 kg)   SpO2 96%   BMI 26.18 kg/m   BP/Weight 03/19/2018 03/07/2018 10/06/2016  Systolic BP 89 115 118  Diastolic BP 53 60 81  Wt. (Lbs) 162.2 - -  BMI 26.18 22.92 -      Physical Exam  Constitutional: She is oriented to person, place, and time. She appears well-developed and well-nourished.  Cardiovascular: Normal rate, normal heart sounds and intact distal pulses.  No murmur heard. Pulmonary/Chest: Effort normal and breath sounds normal. She has no wheezes. She has no rales. She exhibits no tenderness.  Abdominal: Soft. Bowel sounds are normal. She exhibits no distension and no mass. There is no tenderness.  Genitourinary:  Genitourinary Comments: Normal external genitalia, no polyps identified External hemorrhoids at 12:00, nonthrombosed nonbleeding  Musculoskeletal: Normal range of motion.  Neurological: She is alert and oriented to person, place, and time.  Skin: Skin is warm and dry.  Psychiatric: She has a normal mood and affect.     Assessment & Plan:   1. Bipolar disorder, current episode manic severe with psychotic features (HCC) Currently under the care of [facility] She does take a bunch of psychotropic medications some of which do have interactions and will need to be seen by her psychiatrist. [facility] on her Seroquel, Zoloft,Rozerem, doxepin, Cogentin, gabapentin  2. ASCUS of cervix with negative high risk HPV I do not see polyps on her pelvic exam I have explained to the patient the ACOG guidelines regarding management of ASCUS with  negative HPV which recommend a repeat Pap smear with HPV testing in 3 years however I have informed her I will be repeating her Pap smear in 3 years to follow-up on this as she is so worried about it and the fact that she could be developing cancer.  3. Helicobacter pylori gastritis Omeprazole refilled [facility] - pantoprazole (PROTONIX) 40 MG tablet; Take 1 tablet (40 mg total) by mouth daily.  Dispense: 30 tablet; Refill: 3  4. Other specified hypothyroidism - T4, free - TSH  5. Other hemorrhoids - polyethylene glycol powder (GLYCOLAX/MIRALAX) powder; Take 17 g by mouth 2 (two) times daily as needed.  Dispense: 3350 g; Refill: 1 - pramoxine-hydrocortisone (ANALPRAM-HC) 1-1 % rectal cream; Place 1 application rectally 2 (two) times daily.  Dispense: 30 g; Refill:  0  6. Pruritus She informs me she takes doxepin for pruritus however this interacts with her other medications. I have prescribed hydroxyzine - hydrOXYzine (ATARAX/VISTARIL) 10 MG tablet; Take 1 tablet (10 mg total) by mouth 2 (two) times daily as needed for itching.  Dispense: 60 tablet; Refill: 0   Meds ordered this encounter  Medications  . pantoprazole (PROTONIX) 40 MG tablet    Sig: Take 1 tablet (40 mg total) by mouth daily.    Dispense:  30 tablet    Refill:  3  . polyethylene glycol powder (GLYCOLAX/MIRALAX) powder    Sig: Take 17 g by mouth 2 (two) times daily as needed.    Dispense:  3350 g    Refill:  1  . pramoxine-hydrocortisone (ANALPRAM-HC) 1-1 % rectal cream    Sig: Place 1 application rectally 2 (two) times daily.    Dispense:  30 g    Refill:  0  . hydrOXYzine (ATARAX/VISTARIL) 10 MG tablet    Sig: Take 1 tablet (10 mg total) by mouth 2 (two) times daily as needed for itching.    Dispense:  60 tablet    Refill:  0    Follow-up: Return in about 3 months (around 06/19/2018) for follow up on chronic medical conditions.   Hoy Register MD

## 2018-03-20 ENCOUNTER — Encounter: Payer: Self-pay | Admitting: Family Medicine

## 2018-03-20 LAB — T4, FREE: FREE T4: 0.88 ng/dL (ref 0.82–1.77)

## 2018-03-20 LAB — TSH: TSH: 0.867 u[IU]/mL (ref 0.450–4.500)

## 2018-03-20 MED ORDER — SYNTHROID 50 MCG PO TABS: 50.0000 ug | ORAL_TABLET | Freq: Every day | ORAL | 3 refills | Status: DC

## 2018-03-27 ENCOUNTER — Encounter (HOSPITAL_COMMUNITY): Payer: Self-pay | Admitting: *Deleted

## 2018-04-19 ENCOUNTER — Encounter (HOSPITAL_COMMUNITY): Payer: Self-pay | Admitting: *Deleted

## 2018-04-19 ENCOUNTER — Telehealth (HOSPITAL_COMMUNITY): Payer: Self-pay | Admitting: *Deleted

## 2018-04-19 NOTE — Progress Notes (Signed)
Letter mailed to patient with pap smear results.  

## 2018-04-19 NOTE — Telephone Encounter (Signed)
Telephoned patient at home number and advised patient of abnormal pap smear results. HPV was negative. Advised patient with result that recommendation is repeat pap in one year. Patient voiced understanding.

## 2018-05-06 ENCOUNTER — Ambulatory Visit (INDEPENDENT_AMBULATORY_CARE_PROVIDER_SITE_OTHER): Payer: Self-pay | Admitting: Student

## 2018-05-06 ENCOUNTER — Encounter: Payer: Self-pay | Admitting: Student

## 2018-05-06 VITALS — BP 107/69 | HR 69 | Ht 66.0 in | Wt 152.4 lb

## 2018-05-06 DIAGNOSIS — R87610 Atypical squamous cells of undetermined significance on cytologic smear of cervix (ASC-US): Secondary | ICD-10-CM

## 2018-05-06 NOTE — Progress Notes (Signed)
History:  [name] is a 52 y.o. [id number] who presents to clinic today for discussion regarding previous pap smears. Had a pap smear in 08/2017 that was ASCUS, HPV testing not done. Had a repeat pap smear 5/30 through [facility] which was ASCUS negative HRHPV. Pt was instructed to f/u in 1 year for repeat pap smear. Per [facility] notes, patient had 3 lesions on her left labia & was referred here for further evaluation. Patient has not noticed any lesions herself. Denies vaginal discharge or irritation.  Patient wants to discuss her hx of abnormal pap smears. She has had at least 4 abnormal pap smears since she was 21. States she was told when she was 21 that she had HPV & was given antibiotics but is unsure. Thinks she had a colposcopy in 2013.    She went to a PCP at [facility] last month & was told that she didn't see any vulvar lesions.    Patient Active Problem List   Diagnosis Date Noted  . ASCUS of cervix with negative high risk HPV 03/19/2018  . Helicobacter pylori gastritis 03/19/2018  . Hypothyroidism 03/19/2018  . Bipolar disorder, current episode manic severe with psychotic features (HCC) 10/06/2016  . Alcohol use disorder, severe, dependence (HCC) 10/06/2016  . Alcohol use disorder, severe, dependence (HCC) 09/26/2016  . Major depressive disorder with single episode 09/26/2016  . Acute encephalopathy 09/20/2016  . Airway intubation performed without difficulty   . Hypothermia   . Alcoholic intoxication with complication (HCC)   . COPD with acute exacerbation (HCC)     Allergies  Allergen Reactions  . Morphine And Related Other (See Comments)    "Makes me crazy"  . Morphine And Related Other (See Comments)    "It is bad"  . Morphine And Related   . Trazodone And Nefazodone Other (See Comments)    Current Outpatient Medications on File Prior to Visit  Medication Sig Dispense Refill  . benztropine (COGENTIN) 0.5 MG tablet Take 0.5 mg by mouth at bedtime.  0  . doxepin  (SINEQUAN) 50 MG capsule Take 50 mg by mouth at bedtime.  0  . gabapentin (NEURONTIN) 800 MG tablet Take 800 mg by mouth 3 (three) times daily.  0  . polyethylene glycol powder (GLYCOLAX/MIRALAX) powder Take 17 g by mouth 2 (two) times daily as needed. 3350 g 1  . QUEtiapine (SEROQUEL) 100 MG tablet Take 1 tablet (100 mg total) by mouth 2 (two) times daily. 60 tablet 0  . QUEtiapine (SEROQUEL) 400 MG tablet Take 1 tablet (400 mg total) by mouth at bedtime. 30 tablet 0  . ramelteon (ROZEREM) 8 MG tablet Take 1 tablet (8 mg total) by mouth at bedtime. 30 tablet 0  . sertraline (ZOLOFT) 100 MG tablet Take 100 mg by mouth daily.    Marland Kitchen. SYNTHROID 50 MCG tablet Take 1 tablet (50 mcg total) by mouth daily. 30 tablet 3  . thiamine (VITAMIN B-1) 100 MG tablet Take 1 tablet (100 mg total) by mouth daily. 30 tablet 0  . chlordiazePOXIDE (LIBRIUM) 25 MG capsule 50mg  PO TID x 1D, then 25-50mg  PO BID X 1D, then 25-50mg  PO QD X 1D (Patient not taking: Reported on 03/07/2018) 10 capsule 0  . ferrous fumarate (HEMOCYTE - 106 MG FE) 325 (106 Fe) MG TABS tablet Take 2 tablets by mouth daily.    Marland Kitchen. gabapentin (NEURONTIN) 400 MG capsule Take 2 capsules (800 mg total) by mouth 4 (four) times daily -  with meals and at  bedtime. (Patient not taking: Reported on 05/06/2018) 30 capsule 0  . GABAPENTIN PO Take by mouth.    . hydrOXYzine (ATARAX/VISTARIL) 10 MG tablet Take 1 tablet (10 mg total) by mouth 2 (two) times daily as needed for itching. (Patient not taking: Reported on 05/06/2018) 60 tablet 0  . pantoprazole (PROTONIX) 40 MG tablet Take 1 tablet (40 mg total) by mouth daily. (Patient not taking: Reported on 05/06/2018) 30 tablet 3  . pantoprazole (PROTONIX) 40 MG tablet Take 40 mg by mouth daily.    . pramoxine-hydrocortisone (ANALPRAM-HC) 1-1 % rectal cream Place 1 application rectally 2 (two) times daily. (Patient not taking: Reported on 05/06/2018) 30 g 0  . QUEtiapine Fumarate (SEROQUEL PO) Take by mouth.     No  current facility-administered medications on file prior to visit.      The following portions of the patient's history were reviewed and updated as appropriate: allergies, current medications, family history, past medical history, social history, past surgical history and problem list.  Review of Systems:  Other than those mentioned in HPI all ROS negative   Objective:  Physical Exam BP 107/69   Pulse 69   Ht 5\' 6"  (1.676 m)   Wt 152 lb 6.4 oz (69.1 kg)   BMI 24.60 kg/m  CONSTITUTIONAL: Well-developed, well-nourished female in no acute distress.  EYES: EOM intact, conjunctivae normal, no scleral icterus HEAD: Normocephalic, atraumatic GENITOURINARY: Normal appearing external genitalia. No lesions noted on labia or perineal area. Pap smear not obtained.  PSYCHIATRIC: Normal mood and affect. Normal behavior. Normal judgment and thought content.  Labs and Imaging none  Assessment & Plan:  Assessment: Benign exam Discussed ASCUS and recommendations for follow up. Patient is agreeable with plan. She does not have insurance. She will f/u with pap clinic in 1 year.    Judeth Horn, NP 05/06/2018 10:20 AM

## 2018-05-06 NOTE — Patient Instructions (Addendum)

## 2018-06-19 ENCOUNTER — Emergency Department (HOSPITAL_COMMUNITY)
Admission: EM | Admit: 2018-06-19 | Discharge: 2018-06-19 | Disposition: A | Discharge status: Home / Self Care | Payer: Medicaid - Out of State | Attending: Emergency Medicine | Admitting: Emergency Medicine

## 2018-06-19 ENCOUNTER — Encounter (HOSPITAL_COMMUNITY): Payer: Self-pay

## 2018-06-19 ENCOUNTER — Other Ambulatory Visit: Payer: Self-pay

## 2018-06-19 ENCOUNTER — Emergency Department (HOSPITAL_COMMUNITY): Payer: Medicaid - Out of State

## 2018-06-19 DIAGNOSIS — E039 Hypothyroidism, unspecified: Secondary | ICD-10-CM | POA: Insufficient documentation

## 2018-06-19 DIAGNOSIS — Z79899 Other long term (current) drug therapy: Secondary | ICD-10-CM | POA: Diagnosis not present

## 2018-06-19 DIAGNOSIS — F17210 Nicotine dependence, cigarettes, uncomplicated: Secondary | ICD-10-CM | POA: Insufficient documentation

## 2018-06-19 DIAGNOSIS — R519 Headache, unspecified: Secondary | ICD-10-CM

## 2018-06-19 DIAGNOSIS — R51 Headache: Secondary | ICD-10-CM | POA: Diagnosis not present

## 2018-06-19 DIAGNOSIS — R6889 Other general symptoms and signs: Secondary | ICD-10-CM | POA: Insufficient documentation

## 2018-06-19 LAB — CBC WITH DIFFERENTIAL/PLATELET
Basophils Absolute: 0.1 10*3/uL (ref 0.0–0.1)
Basophils Relative: 1 %
EOS PCT: 4 %
Eosinophils Absolute: 0.3 10*3/uL (ref 0.0–0.7)
HCT: 38.4 % (ref 36.0–46.0)
Hemoglobin: 13 g/dL (ref 12.0–15.0)
LYMPHS ABS: 3 10*3/uL (ref 0.7–4.0)
LYMPHS PCT: 34 %
MCH: 30.7 pg (ref 26.0–34.0)
MCHC: 33.9 g/dL (ref 30.0–36.0)
MCV: 90.6 fL (ref 78.0–100.0)
MONOS PCT: 10 %
Monocytes Absolute: 0.9 10*3/uL (ref 0.1–1.0)
Neutro Abs: 4.5 10*3/uL (ref 1.7–7.7)
Neutrophils Relative %: 51 %
Platelets: 347 10*3/uL (ref 150–400)
RBC: 4.24 MIL/uL (ref 3.87–5.11)
RDW: 15.7 % — ABNORMAL HIGH (ref 11.5–15.5)
WBC: 8.8 10*3/uL (ref 4.0–10.5)

## 2018-06-19 LAB — BASIC METABOLIC PANEL
ANION GAP: 11 (ref 5–15)
BUN: 15 mg/dL (ref 6–20)
CO2: 20 mmol/L — ABNORMAL LOW (ref 22–32)
CREATININE: 0.9 mg/dL (ref 0.44–1.00)
Calcium: 8.5 mg/dL — ABNORMAL LOW (ref 8.9–10.3)
Chloride: 113 mmol/L — ABNORMAL HIGH (ref 98–111)
GFR calc Af Amer: >60 mL/min (ref >60)
GFR calc non Af Amer: >60 mL/min (ref >60)
GLUCOSE: 63 mg/dL — AB (ref 70–99)
Potassium: 3.6 mmol/L (ref 3.5–5.1)
Sodium: 144 mmol/L (ref 135–145)

## 2018-06-19 MED ORDER — DIPHENHYDRAMINE HCL 50 MG/ML IJ SOLN
25.0000 mg | Freq: Once | INTRAMUSCULAR | Status: AC
  Administered 2018-06-19: 25 mg via INTRAVENOUS
  Filled 2018-06-19: qty 1

## 2018-06-19 MED ORDER — KETOROLAC TROMETHAMINE 30 MG/ML IJ SOLN: 30.0000 mg | Freq: Once | INTRAMUSCULAR | Status: DC

## 2018-06-19 MED ORDER — BUTALBITAL-APAP-CAFFEINE 50-325-40 MG PO TABS: 1.0000 | ORAL_TABLET | Freq: Four times a day (QID) | ORAL | 0 refills | Status: DC | PRN

## 2018-06-19 MED ORDER — PROCHLORPERAZINE EDISYLATE 10 MG/2ML IJ SOLN
10.0000 mg | Freq: Once | INTRAMUSCULAR | Status: AC
  Administered 2018-06-19: 10 mg via INTRAVENOUS
  Filled 2018-06-19: qty 2

## 2018-06-19 NOTE — ED Triage Notes (Addendum)
Pt states she has had a headache for 6 days in the back of her head. Pt states that it has been "debilitating". Pt takes she has been taking10-12 [facility] powders. Pt states the last powder at 1230. Pt also talking about forgetfulness. Pt states that she will go to get ritz crackers, and then forget what she got up for. Pt then states she said she brought a short sleeve shirt for if she was cold and a long sleeve shirt if she was warm. Pt states she said this into her phone and knew it was wrong.

## 2018-06-19 NOTE — ED Provider Notes (Signed)
[facility]-EMERGENCY DEPT Provider Note   CSN: [id number] Arrival date & time: 06/19/18  1849     History   Chief Complaint Chief Complaint  Patient presents with  . Headache  . Memory Loss    HPI Mary Dodson is a 52 y.o. female with history of bipolar disorder, hypothyroidism, previous EtOH abuse, is here for evaluation of headache.  Onset 6 days ago.  Headache is posterior, nonradiating, constant, stabbing sensation.  Described as severe.  Onset was gradual, worsening.  Patient woke up to the headache.  Remembers having similar headaches during both previous pregnancies many years ago but since she has not had migraines her frequent headaches.  She has taken multiple doses of BC powders without relief, she is having 8-10 BC powders daily and her last one was at 1230 today.  This provides no relief.  No preceding head trauma.  No associated photophobia, phonophobia, nausea, vomiting, fever, vision changes, neck pain or stiffness, numbness tingling or weakness to extremities.  No issues with gait.  No slurred speech.  Patient notes for the last 8 weeks she has been more forgetful.  Describes this as forgetting what she is talking about in the middle of a conversation.  She is forgetting what she is doing at times and has to think back and try to remember what she was intending to do.  She finds herself mixing up words for example saying April instead of July.  This is happening up to 4-5 times a day.  Today, she noticed having difficulty hearing.  She noticed she had to increase the volume of the TV to be able to hear it.  Noticed she could not hear her roommate speak.  This has been intermittent, feels like her hearing is back to baseline now.  HPI  Past Medical History:  Diagnosis Date  . PTSD (post-traumatic stress disorder)     Patient Active Problem List   Diagnosis Date Noted  . ASCUS of cervix with negative high risk HPV 03/19/2018  . Helicobacter pylori  gastritis 03/19/2018  . Hypothyroidism 03/19/2018  . Bipolar disorder, current episode manic severe with psychotic features (HCC) 10/06/2016  . Alcohol use disorder, severe, dependence (HCC) 10/06/2016  . Alcohol use disorder, severe, dependence (HCC) 09/26/2016  . Major depressive disorder with single episode 09/26/2016  . Acute encephalopathy 09/20/2016  . Airway intubation performed without difficulty   . Hypothermia   . Alcoholic intoxication with complication (HCC)   . COPD with acute exacerbation (HCC)     History reviewed. No pertinent surgical history.   OB History    Gravida  4   Para  0   Term  0   Preterm  0   AB  1   Living        SAB  1   TAB  0   Ectopic  0   Multiple      Live Births  2            Home Medications    Prior to Admission medications   Medication Sig Start Date End Date Taking? Authorizing Provider  Aspirin-Salicylamide-Caffeine (BC HEADACHE PO) Take 6-7 packets by mouth daily as needed (headache).   Yes [provider]  benztropine (COGENTIN) 0.5 MG tablet Take 0.5 mg by mouth at bedtime. 02/20/18  Yes [provider]  doxepin (SINEQUAN) 50 MG capsule Take 50 mg by mouth at bedtime. 03/14/18  Yes [provider]  gabapentin (NEURONTIN) 800 MG tablet  Take 800 mg by mouth 3 (three) times daily. 02/20/18  Yes [provider]  omeprazole (PRILOSEC) 40 MG capsule Take 40 mg by mouth daily. 05/27/18  Yes [provider]  pantoprazole (PROTONIX) 40 MG tablet Take 40 mg by mouth daily.   Yes [provider]  promethazine (PHENERGAN) 25 MG tablet Take 25 mg by mouth daily as needed. 05/27/18  Yes [provider]  QUEtiapine (SEROQUEL) 100 MG tablet Take 1 tablet (100 mg total) by mouth 2 (two) times daily. 10/06/16  Yes Oneta Rack, NP  QUEtiapine (SEROQUEL) 400 MG tablet Take 1 tablet (400 mg total) by mouth at bedtime. 10/06/16  Yes Oneta Rack, NP  ramelteon (ROZEREM) 8  MG tablet Take 1 tablet (8 mg total) by mouth at bedtime. 10/06/16  Yes Oneta Rack, NP  sertraline (ZOLOFT) 100 MG tablet Take 100 mg by mouth daily.   Yes [provider]  SYNTHROID 50 MCG tablet Take 1 tablet (50 mcg total) by mouth daily. 03/20/18  Yes Hoy Register, MD  thiamine (VITAMIN B-1) 100 MG tablet Take 1 tablet (100 mg total) by mouth daily. 10/01/16  Yes Shaune Pollack, MD  butalbital-acetaminophen-caffeine (FIORICET, ESGIC) [phone] MG tablet Take 1-2 tablets by mouth every 6 (six) hours as needed for headache. 06/19/18 06/19/19  Liberty Handy, PA-C  chlordiazePOXIDE (LIBRIUM) 25 MG capsule 50mg  PO TID x 1D, then 25-50mg  PO BID X 1D, then 25-50mg  PO QD X 1D Patient not taking: Reported on 03/07/2018 09/30/16   Tilden Fossa, MD  gabapentin (NEURONTIN) 400 MG capsule Take 2 capsules (800 mg total) by mouth 4 (four) times daily -  with meals and at bedtime. Patient not taking: Reported on 05/06/2018 10/06/16   Oneta Rack, NP  hydrOXYzine (ATARAX/VISTARIL) 10 MG tablet Take 1 tablet (10 mg total) by mouth 2 (two) times daily as needed for itching. Patient not taking: Reported on 05/06/2018 03/19/18   Hoy Register, MD  pantoprazole (PROTONIX) 40 MG tablet Take 1 tablet (40 mg total) by mouth daily. Patient not taking: Reported on 05/06/2018 03/19/18   Hoy Register, MD  polyethylene glycol powder (GLYCOLAX/MIRALAX) powder Take 17 g by mouth 2 (two) times daily as needed. Patient not taking: Reported on 06/19/2018 03/19/18   Hoy Register, MD  pramoxine-hydrocortisone [facility]) 1-1 % rectal cream Place 1 application rectally 2 (two) times daily. Patient not taking: Reported on 05/06/2018 03/19/18   Hoy Register, MD    Family History Family History  Problem Relation Age of Onset  . Cancer Mother   . Breast cancer Maternal Grandmother   . Cancer Maternal Grandmother     Social History Social History   Tobacco Use  . Smoking status: Current Every  Day Smoker    Packs/day: 1.00  . Smokeless tobacco: Never Used  Substance Use Topics  . Alcohol use: Not Currently  . Drug use: No     Allergies   Morphine and related; Morphine and related; Morphine and related; and Trazodone and nefazodone   Review of Systems Review of Systems  Neurological: Positive for headaches.       Forgetfulness   All other systems reviewed and are negative.    Physical Exam Updated Vital Signs BP (!) 108/91 (BP Location: Left Arm)   Pulse 76   Temp 97.8 F (36.6 C) (Oral)   Resp 18   Ht 5\' 6"  (1.676 m)   Wt 70.3 kg   SpO2 96%   BMI 25.02 kg/m  Physical Exam  Constitutional: She appears well-developed.  Non-toxic appearance.  NAD.  HENT:  Head: Normocephalic and atraumatic.  Right Ear: External ear normal.  Left Ear: External ear normal.  Nose: Nose normal. No mucosal edema or septal deviation.  Moist mucous membranes Uvula midline Oropharynx and tonsils normal No tenderness over temporal arteries TM slightly cloudy without bulging, erythema, perforation.   Eyes: Conjunctivae and lids are normal.  Unable to visualize back of eye  Neck:  No c spine spinous process or muscular tenderness  Full PROM of neck w/o rigidity  No meningeal signs   Cardiovascular: Normal rate, regular rhythm and normal heart sounds.  Pulses:      Radial pulses are 2+ on the right side, and 2+ on the left side.       Dorsalis pedis pulses are 2+ on the right side, and 2+ on the left side.  Pulmonary/Chest: Effort normal and breath sounds normal.  Lymphadenopathy:  No cervical adenopathy  Neurological: She is alert. GCS eye subscore is 4. GCS verbal subscore is 5. GCS motor subscore is 6.  Speech is fluent without obvious dysarthria or dysphasia. Strength 5/5 with hand grip and ankle F/E.   Sensation to light touch intact in hands and feet. Normal gait/No truncal sway. No pronator drift. No leg drop.  Normal finger-to-nose and finger tapping.  CN I and  VIII not tested. CN II-XII grossly intact bilaterally.  Knee and brachioradialis DTR symmetric.   Skin: Skin is warm and dry. Capillary refill takes less than 2 seconds. No rash noted.  Psychiatric: She has a normal mood and affect. Her speech is normal and behavior is normal. Judgment and thought content normal.     ED Treatments / Results  Labs (all labs ordered are listed, but only abnormal results are displayed) Labs Reviewed  CBC WITH DIFFERENTIAL/PLATELET - Abnormal; Notable for the following components:      Result Value   RDW 15.7 (*)    All other components within normal limits  BASIC METABOLIC PANEL - Abnormal; Notable for the following components:   Chloride 113 (*)    CO2 20 (*)    Glucose, Bld 63 (*)    Calcium 8.5 (*)    All other components within normal limits    EKG None  Radiology Ct Head Wo Contrast  Result Date: 06/19/2018 CLINICAL DATA:  Severe occipital headache. Patient reports headache for 6 days. EXAM: CT HEAD WITHOUT CONTRAST TECHNIQUE: Contiguous axial images were obtained from the base of the skull through the vertex without intravenous contrast. COMPARISON:  None. FINDINGS: Brain: No intracranial hemorrhage, mass effect, or midline shift. No hydrocephalus. The basilar cisterns are patent. No evidence of territorial infarct or acute ischemia. No extra-axial or intracranial fluid collection. Vascular: No hyperdense vessel. Skull: Normal. Negative for fracture or focal lesion. Sinuses/Orbits: Paranasal sinuses and mastoid air cells are clear. The visualized orbits are unremarkable. Other: None. IMPRESSION: Negative head CT. Electronically Signed   By: Narda Rutherford M.D.   On: 06/19/2018 22:45    Procedures Procedures (including critical care time)  Medications Ordered in ED Medications  prochlorperazine (COMPAZINE) injection 10 mg (10 mg Intravenous Given 06/19/18 2157)  diphenhydrAMINE (BENADRYL) injection 25 mg (25 mg Intravenous Given 06/19/18 2157)      Initial Impression / Assessment and Plan / ED Course  I have reviewed the triage vital signs and the nursing notes.  Pertinent labs & imaging results that were available during my care of the patient were  reviewed by me and considered in my medical decision making (see chart for details).  Clinical Course as of Jun 20 2319  Wed Jun 19, 2018  2256 Re-evaluated pt. Reports partial response to compazine/benadryl.  Discussed return precautions.    [CG]    Clinical Course User Index [CG] Liberty Handy, PA-C   Patient is here with headache.  Associated with forgetfulness, intermittent decreased hearing.  She has no history of frequent headaches or migraines.  Patient is without high risk features of headache including seizure activity, exertional headache, immunocompromise, neck rigidity, fever, use of anticoagulation, preceding trauma, previous CVA/TIA, hypertension.  She has a normal neurological exam without meningismus, focal neuro deficits, temporal tenderness.  Most likely tension type headache.  I have low suspicion for emergent hemorrhage or other intracranial/vascular issue.  However, given age, persistent pain, describes severity felt CT head is reasonable.  She  CT is negative.  Screening labs WNL.  Patient given migraine cocktail with partial resolution of her headache.  Patient is considered adequate for discharge.  Discussed signs and symptoms that would warrant return to the ER.  I encouraged her to follow-up with neurology for further evaluation of headache and forgetfulness.  Patient is in agreement.  Final Clinical Impressions(s) / ED Diagnoses   Final diagnoses:  Occipital headache  Forgetfulness    ED Discharge Orders         Ordered    butalbital-acetaminophen-caffeine (FIORICET, ESGIC) 50-325-40 MG tablet  Every 6 hours PRN     06/19/18 2258           Jerrell Mylar 06/19/18 2320    Mancel Bale, MD 06/20/18 2315

## 2018-06-19 NOTE — Discharge Instructions (Addendum)
You were seen in the emergency department for a headache.  CT scan was normal.  Your blood work was normal.  We treated your headache with Compazine and Benadryl and this relieved your headache partially.  The cause of your headache is unclear however it could be from stress, overuse of BC powders, mild dehydration or new onset migraines.  Stop taking BC powders.  You can take 500 to 1000 mg of acetaminophen for headache.  You have been given a prescription for breakthrough headache.  You should not be using these medicines frequently, daily.  I recommend you follow-up with neurology within the next 7 to 10 days for further evaluation.  They can treat headaches long term and do further testings for forgetfulness and your other associated symptoms.   Return to the ER for worsening pain, fever, neck stiffness or rigidity, vision changes, slurred speech, facial drooping, weakness heaviness to your muscles or extremities, numbness, gait difficulty.

## 2018-06-24 ENCOUNTER — Ambulatory Visit: Payer: Self-pay | Admitting: Internal Medicine

## 2018-07-22 ENCOUNTER — Other Ambulatory Visit: Payer: Self-pay | Admitting: Family Medicine

## 2018-07-22 DIAGNOSIS — L299 Pruritus, unspecified: Secondary | ICD-10-CM

## 2018-08-12 ENCOUNTER — Other Ambulatory Visit: Payer: Self-pay | Admitting: Family Medicine

## 2018-08-12 DIAGNOSIS — L299 Pruritus, unspecified: Secondary | ICD-10-CM

## 2018-10-01 ENCOUNTER — Encounter (HOSPITAL_COMMUNITY): Payer: Self-pay | Admitting: Emergency Medicine

## 2018-10-01 ENCOUNTER — Emergency Department (HOSPITAL_COMMUNITY): Payer: Self-pay

## 2018-10-01 ENCOUNTER — Emergency Department (HOSPITAL_COMMUNITY)
Admission: EM | Admit: 2018-10-01 | Discharge: 2018-10-01 | Disposition: A | Discharge status: Home / Self Care | Payer: Self-pay | Attending: Emergency Medicine | Admitting: Emergency Medicine

## 2018-10-01 DIAGNOSIS — R51 Headache: Secondary | ICD-10-CM | POA: Insufficient documentation

## 2018-10-01 DIAGNOSIS — E039 Hypothyroidism, unspecified: Secondary | ICD-10-CM | POA: Insufficient documentation

## 2018-10-01 DIAGNOSIS — Z79899 Other long term (current) drug therapy: Secondary | ICD-10-CM | POA: Insufficient documentation

## 2018-10-01 DIAGNOSIS — J449 Chronic obstructive pulmonary disease, unspecified: Secondary | ICD-10-CM | POA: Insufficient documentation

## 2018-10-01 DIAGNOSIS — R2990 Unspecified symptoms and signs involving the nervous system: Secondary | ICD-10-CM

## 2018-10-01 DIAGNOSIS — F17200 Nicotine dependence, unspecified, uncomplicated: Secondary | ICD-10-CM | POA: Insufficient documentation

## 2018-10-01 DIAGNOSIS — R519 Headache, unspecified: Secondary | ICD-10-CM

## 2018-10-01 HISTORY — DX: Alcohol abuse, uncomplicated: F10.10

## 2018-10-01 LAB — URINALYSIS, ROUTINE W REFLEX MICROSCOPIC
BILIRUBIN URINE: NEGATIVE
GLUCOSE, UA: NEGATIVE mg/dL
HGB URINE DIPSTICK: NEGATIVE
KETONES UR: NEGATIVE mg/dL
Leukocytes, UA: NEGATIVE
Nitrite: NEGATIVE
PROTEIN: NEGATIVE mg/dL
Specific Gravity, Urine: 1.011 (ref 1.005–1.030)
pH: 5 (ref 5.0–8.0)

## 2018-10-01 LAB — COMPREHENSIVE METABOLIC PANEL
ALBUMIN: 3.4 g/dL — AB (ref 3.5–5.0)
ALK PHOS: 55 U/L (ref 38–126)
ALT: 22 U/L (ref 0–44)
AST: 43 U/L — AB (ref 15–41)
Anion gap: 10 (ref 5–15)
BILIRUBIN TOTAL: 0.4 mg/dL (ref 0.3–1.2)
BUN: 11 mg/dL (ref 6–20)
CALCIUM: 8.3 mg/dL — AB (ref 8.9–10.3)
CO2: 18 mmol/L — ABNORMAL LOW (ref 22–32)
CREATININE: 0.74 mg/dL (ref 0.44–1.00)
Chloride: 113 mmol/L — ABNORMAL HIGH (ref 98–111)
GFR calc Af Amer: >60 mL/min (ref >60)
GLUCOSE: 95 mg/dL (ref 70–99)
Potassium: 3.5 mmol/L (ref 3.5–5.1)
Sodium: 141 mmol/L (ref 135–145)
TOTAL PROTEIN: 5.7 g/dL — AB (ref 6.5–8.1)

## 2018-10-01 LAB — I-STAT CHEM 8, ED
BUN: 11 mg/dL (ref 6–20)
CALCIUM ION: 1.05 mmol/L — AB (ref 1.15–1.40)
CREATININE: 0.6 mg/dL (ref 0.44–1.00)
Chloride: 114 mmol/L — ABNORMAL HIGH (ref 98–111)
GLUCOSE: 90 mg/dL (ref 70–99)
HCT: 33 % — ABNORMAL LOW (ref 36.0–46.0)
HEMOGLOBIN: 11.2 g/dL — AB (ref 12.0–15.0)
POTASSIUM: 3.5 mmol/L (ref 3.5–5.1)
Sodium: 141 mmol/L (ref 135–145)
TCO2: 20 mmol/L — ABNORMAL LOW (ref 22–32)

## 2018-10-01 LAB — CBC WITH DIFFERENTIAL/PLATELET
Abs Immature Granulocytes: 0.03 10*3/uL (ref 0.00–0.07)
BASOS ABS: 0.1 10*3/uL (ref 0.0–0.1)
Basophils Relative: 1 %
EOS PCT: 3 %
Eosinophils Absolute: 0.2 10*3/uL (ref 0.0–0.5)
HEMATOCRIT: 33.1 % — AB (ref 36.0–46.0)
HEMOGLOBIN: 10.8 g/dL — AB (ref 12.0–15.0)
IMMATURE GRANULOCYTES: 0 %
LYMPHS ABS: 2.3 10*3/uL (ref 0.7–4.0)
LYMPHS PCT: 28 %
MCH: 30.6 pg (ref 26.0–34.0)
MCHC: 32.6 g/dL (ref 30.0–36.0)
MCV: 93.8 fL (ref 80.0–100.0)
MONOS PCT: 8 %
Monocytes Absolute: 0.7 10*3/uL (ref 0.1–1.0)
NEUTROS PCT: 60 %
NRBC: 0 % (ref 0.0–0.2)
Neutro Abs: 5.1 10*3/uL (ref 1.7–7.7)
Platelets: 272 10*3/uL (ref 150–400)
RBC: 3.53 MIL/uL — ABNORMAL LOW (ref 3.87–5.11)
RDW: 15.6 % — ABNORMAL HIGH (ref 11.5–15.5)
WBC: 8.5 10*3/uL (ref 4.0–10.5)

## 2018-10-01 LAB — I-STAT CG4 LACTIC ACID, ED: Lactic Acid, Venous: 0.55 mmol/L (ref 0.5–1.9)

## 2018-10-01 LAB — CBG MONITORING, ED: GLUCOSE-CAPILLARY: 78 mg/dL (ref 70–99)

## 2018-10-01 LAB — I-STAT TROPONIN, ED: Troponin i, poc: 0 ng/mL (ref 0.00–0.08)

## 2018-10-01 LAB — ETHANOL: Alcohol, Ethyl (B): <10 mg/dL (ref <10)

## 2018-10-01 LAB — AMMONIA: Ammonia: 24 umol/L (ref 9–35)

## 2018-10-01 MED ORDER — FENTANYL CITRATE (PF) 100 MCG/2ML IJ SOLN
50.0000 ug | Freq: Once | INTRAMUSCULAR | Status: AC
  Administered 2018-10-01: 50 ug via INTRAVENOUS
  Filled 2018-10-01: qty 2

## 2018-10-01 MED ORDER — DIPHENHYDRAMINE HCL 50 MG/ML IJ SOLN
25.0000 mg | Freq: Once | INTRAMUSCULAR | Status: AC
  Administered 2018-10-01: 25 mg via INTRAVENOUS
  Filled 2018-10-01: qty 1

## 2018-10-01 MED ORDER — DIPHENHYDRAMINE HCL 50 MG/ML IJ SOLN
12.5000 mg | Freq: Once | INTRAMUSCULAR | Status: AC
  Administered 2018-10-01: 12.5 mg via INTRAVENOUS
  Filled 2018-10-01: qty 1

## 2018-10-01 MED ORDER — PROCHLORPERAZINE EDISYLATE 10 MG/2ML IJ SOLN
5.0000 mg | Freq: Once | INTRAMUSCULAR | Status: AC
  Administered 2018-10-01: 5 mg via INTRAVENOUS
  Filled 2018-10-01: qty 2

## 2018-10-01 MED ORDER — LORAZEPAM 2 MG/ML IJ SOLN
1.0000 mg | Freq: Once | INTRAMUSCULAR | Status: AC
  Administered 2018-10-01: 1 mg via INTRAVENOUS
  Filled 2018-10-01: qty 1

## 2018-10-01 MED ORDER — SODIUM CHLORIDE 0.9 % IV SOLN
1000.0000 mL | Freq: Once | INTRAVENOUS | Status: AC
  Administered 2018-10-01: 1000 mL via INTRAVENOUS

## 2018-10-01 MED ORDER — THIAMINE HCL 100 MG/ML IJ SOLN
100.0000 mg | Freq: Once | INTRAMUSCULAR | Status: AC
  Administered 2018-10-01: 100 mg via INTRAVENOUS
  Filled 2018-10-01: qty 2

## 2018-10-01 NOTE — ED Provider Notes (Signed)
Received sign out at beginning of shift.  Pt with hx of migraine headache presenting with headache for the past few days, along with generalized weakness, dropping objects, and having increase confusion.  She also has had multiple falls, with bruising to her face, chest, and knees.  Initial head ct scan shows reduced conspicuity of sulci which may indicate dehydration vs intracranial edema/swelling.  Recommend MRI brain for further evaluation.  MRI have ordered.  Pt still having headache.  Pt was given benadryl/compazine/IVF initially.  Will provide additional pain management.  Care discussed with Dr. Patria Maneampos.   5:23 PM Nurse report pt is thrashing around in bed with apparent dyskinesia.  Patient given Ativan and Benadryl symptoms did improve.  7:15 PM Normal alcohol level, no significant electrolyte derangement, normal lactic acid, normal CBG, normal troponin, hemoglobin is 10.8, normal WBC, normal ammonia level, UA without evidence of urinary tract infection, brain MRI without acute intracranial abnormality.  Mild chronic ethmoidal and maxillary sinusitis were noted.  I discussed this finding with patient and family member who is at bedside.  I encourage patient to follow-up closely with PCP for further evaluation of her condition.  Return precautions discussed.  Otherwise at this time patient is stable for discharge.  No acute emergent medical condition identified.  Care discussed with Dr. Patria Maneampos.  BP 112/75   Pulse 86   Temp 99.1 F (37.3 C) (Oral)   Resp 17   Ht 5\' 6"  (1.676 m)   Wt 70.3 kg   SpO2 95%   BMI 25.02 kg/m   Results for orders placed or performed during the hospital encounter of 10/01/18  CBC with Differential  Result Value Ref Range   WBC 8.5 4.0 - 10.5 K/uL   RBC 3.53 (L) 3.87 - 5.11 MIL/uL   Hemoglobin 10.8 (L) 12.0 - 15.0 g/dL   HCT 78.233.1 (L) 95.636.0 - 21.346.0 %   MCV 93.8 80.0 - 100.0 fL   MCH 30.6 26.0 - 34.0 pg   MCHC 32.6 30.0 - 36.0 g/dL   RDW 08.615.6 (H) 57.811.5 - 46.915.5 %    Platelets 272 150 - 400 K/uL   nRBC 0.0 0.0 - 0.2 %   Neutrophils Relative % 60 %   Neutro Abs 5.1 1.7 - 7.7 K/uL   Lymphocytes Relative 28 %   Lymphs Abs 2.3 0.7 - 4.0 K/uL   Monocytes Relative 8 %   Monocytes Absolute 0.7 0.1 - 1.0 K/uL   Eosinophils Relative 3 %   Eosinophils Absolute 0.2 0.0 - 0.5 K/uL   Basophils Relative 1 %   Basophils Absolute 0.1 0.0 - 0.1 K/uL   Immature Granulocytes 0 %   Abs Immature Granulocytes 0.03 0.00 - 0.07 K/uL  Comprehensive metabolic panel  Result Value Ref Range   Sodium 141 135 - 145 mmol/L   Potassium 3.5 3.5 - 5.1 mmol/L   Chloride 113 (H) 98 - 111 mmol/L   CO2 18 (L) 22 - 32 mmol/L   Glucose, Bld 95 70 - 99 mg/dL   BUN 11 6 - 20 mg/dL   Creatinine, Ser 6.290.74 0.44 - 1.00 mg/dL   Calcium 8.3 (L) 8.9 - 10.3 mg/dL   Total Protein 5.7 (L) 6.5 - 8.1 g/dL   Albumin 3.4 (L) 3.5 - 5.0 g/dL   AST 43 (H) 15 - 41 U/L   ALT 22 0 - 44 U/L   Alkaline Phosphatase 55 38 - 126 U/L   Total Bilirubin 0.4 0.3 - 1.2 mg/dL   GFR  calc non Af Amer >60 >60 mL/min   GFR calc Af Amer >60 >60 mL/min   Anion gap 10 5 - 15  Urinalysis, Routine w reflex microscopic  Result Value Ref Range   Color, Urine YELLOW YELLOW   APPearance CLEAR CLEAR   Specific Gravity, Urine 1.011 1.005 - 1.030   pH 5.0 5.0 - 8.0   Glucose, UA NEGATIVE NEGATIVE mg/dL   Hgb urine dipstick NEGATIVE NEGATIVE   Bilirubin Urine NEGATIVE NEGATIVE   Ketones, ur NEGATIVE NEGATIVE mg/dL   Protein, ur NEGATIVE NEGATIVE mg/dL   Nitrite NEGATIVE NEGATIVE   Leukocytes, UA NEGATIVE NEGATIVE  Ammonia  Result Value Ref Range   Ammonia 24 9 - 35 umol/L  Ethanol  Result Value Ref Range   Alcohol, Ethyl (B) <10 <10 mg/dL  CBG monitoring, ED  Result Value Ref Range   Glucose-Capillary 78 70 - 99 mg/dL   Comment 1 Notify RN    Comment 2 Document in Chart   I-stat Chem 8, ED  Result Value Ref Range   Sodium 141 135 - 145 mmol/L   Potassium 3.5 3.5 - 5.1 mmol/L   Chloride 114 (H) 98 - 111  mmol/L   BUN 11 6 - 20 mg/dL   Creatinine, Ser 1.61 0.44 - 1.00 mg/dL   Glucose, Bld 90 70 - 99 mg/dL   Calcium, Ion 0.96 (L) 1.15 - 1.40 mmol/L   TCO2 20 (L) 22 - 32 mmol/L   Hemoglobin 11.2 (L) 12.0 - 15.0 g/dL   HCT 04.5 (L) 40.9 - 81.1 %  I-Stat CG4 Lactic Acid, ED  Result Value Ref Range   Lactic Acid, Venous 0.55 0.5 - 1.9 mmol/L  I-stat troponin, ED  Result Value Ref Range   Troponin i, poc 0.00 0.00 - 0.08 ng/mL   Comment 3           Ct Head Wo Contrast  Result Date: 10/01/2018 CLINICAL DATA:  Loss of coordination and headache over the last 5 days. EXAM: CT HEAD WITHOUT CONTRAST TECHNIQUE: Contiguous axial images were obtained from the base of the skull through the vertex without intravenous contrast. COMPARISON:  09/19/2016 FINDINGS: Brain: Compared to the 09/19/2016 CT head, there is slightly reduced prominence of the sulci, and thinner lateral ventricles. Otherwise, the brainstem, cerebellum, cerebral peduncles, thalamus, basal ganglia, basilar cisterns, and ventricular system appear within normal limits. No intracranial hemorrhage, mass lesion, or acute CVA. Vascular: Unremarkable Skull: Unremarkable Sinuses/Orbits: Chronic ethmoid and bilateral maxillary sinusitis. Other: Unremarkable IMPRESSION: 1. Mildly reduced conspicuity of sulci, and slightly thinner lateral ventricles compared to the prior exam of 09/19/2016. This could reflect a change in hydration status, or low-level diffuse intracranial edema/swelling. There is no midline shift or critical degree of swelling, nor do I see significant hypodensity in the white matter to further suggest diffuse cerebral edema. MRI brain may be a helpful adjunct in working up this patient's symptoms. 2. Chronic ethmoid and bilateral maxillary sinusitis. Electronically Signed   By: Gaylyn Rong M.D.   On: 10/01/2018 16:36   Mr Brain Wo Contrast  Result Date: 10/01/2018 CLINICAL DATA:  Initial evaluation for acute headache. EXAM: MRI  HEAD WITHOUT CONTRAST TECHNIQUE: Multiplanar, multiecho pulse sequences of the brain and surrounding structures were obtained without intravenous contrast. COMPARISON:  Prior CT from earlier the same day. FINDINGS: Brain: Cerebral volume within normal limits for age. No focal parenchymal signal abnormality identified. No significant cerebral white matter changes are seen. No abnormal foci of restricted diffusion to suggest  acute or subacute ischemia. Gray-white matter differentiation maintained. No encephalomalacia to suggest chronic cortical infarction. No foci of susceptibility artifact to suggest acute or chronic intracranial hemorrhage. No mass lesion, midline shift, or mass effect. No MRI evidence for global cerebral edema. No extra-axial fluid collection. Ventricles normal size without hydrocephalus. Pituitary gland within normal limits. Midline structures intact and normal. Vascular: Intracranial arterial vascular flow voids are diffusely prominent, of uncertain significance, and may be normal in this patient. Major intracranial vascular flow voids are well maintained. Skull and upper cervical spine: Craniocervical junction within normal limits. Upper cervical spine normal. Bone marrow signal intensity within normal limits. No focal marrow replacing lesion. Scalp soft tissues unremarkable. Sinuses/Orbits: Globes and orbital soft tissues within normal limits. Mild scattered mucosal thickening throughout the paranasal sinuses. Small left maxillary sinus retention cyst. No air-fluid levels to suggest acute sinusitis. No significant mastoid effusion. Inner ear structures grossly normal. Other: None. IMPRESSION: 1. Normal brain MRI for age. No acute intracranial abnormality identified. 2. Mild chronic ethmoidal and maxillary sinusitis. Electronically Signed   By: Rise MuBenjamin  McClintock M.D.   On: 10/01/2018 18:24      Fayrene Helperran, Medha Pippen, PA-C 10/01/18 1917    Azalia Bilisampos, Kevin, MD 10/01/18 [phone]

## 2018-10-01 NOTE — ED Provider Notes (Signed)
[facility] EMERGENCY DEPARTMENT Provider Note   CSN: [id number] Arrival date & time: 10/01/18  1321     History   Chief Complaint No chief complaint on file.   HPI Mary Dodson is a 52 y.o. female who presents the emergency department with chief complaint of headache.  Patient has a past medical history of PTSD, bipolar disorder and severe alcohol dependence.  She is here with her AA sponsor and states that she has been clean and sober for 2 years.  Patient presents with complaint of severe headache.  The headache is located on the top of her head.  She describes it as aching and throbbing.  Patient states that she has had associated episodes of intermittent upper and lower extremity weakness, ataxia, and confusion along with difficulty finding words.  She has been losing grip strength intermittently and dropping things.  Patient states that she had a similar episode of 12 days of severe headache back in September and came for evaluation with negative work-up.  She says she has been fine up until 5 days ago when this headache started.  The patient has had unchanged headache.  She states that last night when she went to bed after several days of feeling very bad she had cleaned up her room and when she woke up in the morning she apparently threw her close all around the room put out cigarettes all over the place and does not remember anything that she did.  She states that when she woke up she could not remember how to call her AA sponsor and was looking her up in a work manual.  She states that she was able to faint but could not make words come out of her mouth.  Her sponsor who is at the bedside states that she seems confused and is not at her baseline. She has no history of Migraine headaches.   HPI  Past Medical History:  Diagnosis Date  . PTSD (post-traumatic stress disorder)     Patient Active Problem List   Diagnosis Date Noted  . ASCUS of cervix with negative  high risk HPV 03/19/2018  . Helicobacter pylori gastritis 03/19/2018  . Hypothyroidism 03/19/2018  . Bipolar disorder, current episode manic severe with psychotic features (HCC) 10/06/2016  . Alcohol use disorder, severe, dependence (HCC) 10/06/2016  . Alcohol use disorder, severe, dependence (HCC) 09/26/2016  . Major depressive disorder with single episode 09/26/2016  . Acute encephalopathy 09/20/2016  . Airway intubation performed without difficulty   . Hypothermia   . Alcoholic intoxication with complication (HCC)   . COPD with acute exacerbation (HCC)     No past surgical history on file.   OB History    Gravida  4   Para  0   Term  0   Preterm  0   AB  1   Living        SAB  1   TAB  0   Ectopic  0   Multiple      Live Births  2            Home Medications    Prior to Admission medications   Medication Sig Start Date End Date Taking? Authorizing Provider  Aspirin-Salicylamide-Caffeine (BC HEADACHE PO) Take 6-7 packets by mouth daily as needed (headache).    [provider]  benztropine (COGENTIN) 0.5 MG tablet Take 0.5 mg by mouth at bedtime. 02/20/18   [provider]  butalbital-acetaminophen-caffeine (FIORICET, ESGIC) 50-325-40 MG  tablet Take 1-2 tablets by mouth every 6 (six) hours as needed for headache. 06/19/18 06/19/19  Liberty HandyGibbons, Claudia J, PA-C  chlordiazePOXIDE (LIBRIUM) 25 MG capsule 50mg  PO TID x 1D, then 25-50mg  PO BID X 1D, then 25-50mg  PO QD X 1D Patient not taking: Reported on 03/07/2018 09/30/16   Tilden Fossaees, Elizabeth, MD  doxepin (SINEQUAN) 50 MG capsule Take 50 mg by mouth at bedtime. 03/14/18   [provider]  gabapentin (NEURONTIN) 400 MG capsule Take 2 capsules (800 mg total) by mouth 4 (four) times daily -  with meals and at bedtime. Patient not taking: Reported on 05/06/2018 10/06/16   Oneta RackLewis, Tanika N, NP  gabapentin (NEURONTIN) 800 MG tablet Take 800 mg by mouth 3 (three) times daily. 02/20/18   [provider]  hydrOXYzine (ATARAX/VISTARIL) 10 MG tablet Take 1 tablet (10 mg total) by mouth 2 (two) times daily as needed for itching. MUST MAKE APPT FOR FURTHER REFILLS 07/23/18   Hoy RegisterNewlin, Enobong, MD  omeprazole (PRILOSEC) 40 MG capsule Take 40 mg by mouth daily. 05/27/18   [provider]  pantoprazole (PROTONIX) 40 MG tablet Take 1 tablet (40 mg total) by mouth daily. Patient not taking: Reported on 05/06/2018 03/19/18   Hoy RegisterNewlin, Enobong, MD  pantoprazole (PROTONIX) 40 MG tablet Take 40 mg by mouth daily.    [provider]  polyethylene glycol powder (GLYCOLAX/MIRALAX) powder Take 17 g by mouth 2 (two) times daily as needed. Patient not taking: Reported on 06/19/2018 03/19/18   Hoy RegisterNewlin, Enobong, MD  pramoxine-hydrocortisone [facility](ANALPRAM-HC) 1-1 % rectal cream Place 1 application rectally 2 (two) times daily. Patient not taking: Reported on 05/06/2018 03/19/18   Hoy RegisterNewlin, Enobong, MD  promethazine (PHENERGAN) 25 MG tablet Take 25 mg by mouth daily as needed. 05/27/18   [provider]  QUEtiapine (SEROQUEL) 100 MG tablet Take 1 tablet (100 mg total) by mouth 2 (two) times daily. 10/06/16   Oneta RackLewis, Tanika N, NP  QUEtiapine (SEROQUEL) 400 MG tablet Take 1 tablet (400 mg total) by mouth at bedtime. 10/06/16   Oneta RackLewis, Tanika N, NP  ramelteon (ROZEREM) 8 MG tablet Take 1 tablet (8 mg total) by mouth at bedtime. 10/06/16   Oneta RackLewis, Tanika N, NP  sertraline (ZOLOFT) 100 MG tablet Take 100 mg by mouth daily.    [provider]  SYNTHROID 50 MCG tablet Take 1 tablet (50 mcg total) by mouth daily. 03/20/18   Hoy RegisterNewlin, Enobong, MD  thiamine (VITAMIN B-1) 100 MG tablet Take 1 tablet (100 mg total) by mouth daily. 10/01/16   Shaune PollackIsaacs, Cameron, MD    Family History Family History  Problem Relation Age of Onset  . Cancer Mother   . Breast cancer Maternal Grandmother   . Cancer Maternal Grandmother     Social History Social History   Tobacco Use  . Smoking status: Current Every Day  Smoker    Packs/day: 1.00  . Smokeless tobacco: Never Used  Substance Use Topics  . Alcohol use: Not Currently  . Drug use: No     Allergies   Morphine and related; Morphine and related; Morphine and related; and Trazodone and nefazodone   Review of Systems Review of Systems Ten systems reviewed and are negative for acute change, except as noted in the HPI.    Physical Exam Updated Vital Signs There were no vitals taken for this visit.  Physical Exam Vitals signs and nursing note reviewed.  Constitutional:      General: She is not in acute distress.  Appearance: She is well-developed. She is not diaphoretic.  HENT:     Head: Normocephalic and atraumatic.  Eyes:     General: No scleral icterus.    Conjunctiva/sclera: Conjunctivae normal.     Pupils: Pupils are equal, round, and reactive to light.     Comments: No horizontal, vertical or rotational nystagmus  Neck:     Musculoskeletal: Normal range of motion and neck supple.     Comments: Full active and passive ROM without pain No midline or paraspinal tenderness No nuchal rigidity or meningeal signs Cardiovascular:     Rate and Rhythm: Normal rate and regular rhythm.  Pulmonary:     Effort: Pulmonary effort is normal. No respiratory distress.     Breath sounds: Normal breath sounds. No wheezing or rales.  Abdominal:     General: Bowel sounds are normal.     Palpations: Abdomen is soft.     Tenderness: There is no abdominal tenderness. There is no guarding or rebound.  Musculoskeletal: Normal range of motion.  Lymphadenopathy:     Cervical: No cervical adenopathy.  Skin:    General: Skin is warm and dry.     Findings: No rash.  Neurological:     Mental Status: She is alert.     Cranial Nerves: No cranial nerve deficit.     Motor: No abnormal muscle tone.     Coordination: Coordination normal.     Comments: Mental Status:  Alert, somewhat disoriented.  Patient has great difficulty following two-step  commands and must be redirected several times.  Speech is pressured and circuitous. Cranial Nerves:  II:  Peripheral visual fields grossly normal, pupils equal, round, reactive to light III,IV, VI: ptosis not present, extra-ocular motions intact bilaterally  V,VII: smile symmetric, facial light touch sensation equal VIII: hearing grossly normal bilaterally  IX,X: midline uvula rise  XI: bilateral shoulder shrug equal and strong XII: midline tongue extension  Motor:  5/5 in upper and lower extremities bilaterally including strong and equal grip strength and dorsiflexion/plantar flexion, patient has difficulty maintaining the strength in her upper extremities and frequently drops her arms. Sensory: Light touch normal in all extremities.  Cerebellar: normal finger-to-nose with bilateral upper extremities Gait: Deferred CV: distal pulses palpable throughout   Psychiatric:        Behavior: Behavior normal.        Thought Content: Thought content normal.        Judgment: Judgment normal.      ED Treatments / Results  Labs (all labs ordered are listed, but only abnormal results are displayed) Labs Reviewed - No data to display  EKG None  Radiology No results found.  Procedures Procedures (including critical care time)  Medications Ordered in ED Medications - No data to display   Initial Impression / Assessment and Plan / ED Course  I have reviewed the triage vital signs and the nursing notes.  Pertinent labs & imaging results that were available during my care of the patient were reviewed by me and considered in my medical decision making (see chart for details).     Patient with concerning headache.  Differential includes complicated migraine, frontal lobe stroke, encephalitis, psychiatric abnormality.  Patient CT scan questionable for global cerebral edema versus dehydration.  I have given sign out to PA Laveda Normanran who will assume care of the patient.  She will need follow-up  MRI.  Patient's EKG shows no acute abnormalities.  Show mild anemia, proteinemia  Final Clinical Impressions(s) / ED Diagnoses  Final diagnoses:  None    ED Discharge Orders    None       Arthor Captain, PA-C 10/01/18 1657    Lorre Nick, MD 10/02/18 [phone]

## 2018-10-01 NOTE — ED Notes (Signed)
Patient transported to MRI 

## 2018-10-01 NOTE — Discharge Instructions (Addendum)
Please follow up closely with your primary care provider for further evaluation of your condition.  Your symptoms may be due to a complicated migraine.  Return if you have any concerns.

## 2018-10-01 NOTE — ED Triage Notes (Signed)
Patient to ED c/o stroke-like symptoms x 5 days, including loss of coordination and headache. A&O x 4.

## 2018-10-21 ENCOUNTER — Encounter: Payer: Self-pay | Admitting: Neurology

## 2018-12-30 ENCOUNTER — Encounter

## 2018-12-30 ENCOUNTER — Ambulatory Visit: Payer: Self-pay | Admitting: Neurology

## 2019-02-26 ENCOUNTER — Other Ambulatory Visit: Payer: Self-pay | Admitting: *Deleted

## 2019-02-26 DIAGNOSIS — Z1231 Encounter for screening mammogram for malignant neoplasm of breast: Secondary | ICD-10-CM

## 2019-03-10 ENCOUNTER — Other Ambulatory Visit: Payer: Self-pay

## 2019-03-12 ENCOUNTER — Ambulatory Visit: Payer: Self-pay | Admitting: Neurology

## 2019-03-13 NOTE — Progress Notes (Signed)
NEUROLOGY CONSULTATION NOTE  Mary CharterCheryl Koskela MRN: [id number] DOB: 02-Apr-1966  Referring provider: Lavinia SharpsMary Ann Placey, NP Primary care provider: Lavinia SharpsMary Ann Placey, NP  Reason for consult:  Migraines, generalized weakness, increased confusion, multiple falls.   HISTORY OF PRESENT ILLNESS: Mary Dodson is a 53 year old right-handed Caucasian woman with Bipolar disorder, PTSD and alcohol dependence (sober for 2 years) who presents for multiple complaints including migraines, generalized weakness, increased confusion, multiple falls.  History supplemented by ED notes.  She had two episodes of headaches with disorientation In September and later in December 2019, described as severe stabbing pain on her crown.  She reports associated weakness of extremities in which her legs give out.  She feels unsteady on her feet and has had multiple falls.  She has increased confusion.  She reports word-finding difficulty.  She reports slurred speech and tremors.  She forgets what she was talking about in a conversation.  She forgets what she is supposed to be doing.  She woke up in the morning and her clothes were thrown all around the room with cigarettes all over the floor and has no memory of doing it.  However, no associated seizures.  She could not remember how to call her AA sponsor.  Symptoms last about 10 to 12 days.     She initially presented to the ED on 06/19/18.  CT of head personally reviewed and was unremarkable.  [facility].  CT of head personally reviewed showed slightly reduced prominence of sulci and thinner lateral ventricles when compared to a prior CT from 09/19/16, concerning for dehydration versus low-level cerebral edema.  However, follow-up MRI of brain without contrast was normal.  CBC demonstrated mild anemia, CMP unremarkable, negative for etOH, negative UA, negative troponin and normal ammonia and lactic acid.    She has no prior  history of such events.  She has hypothyroidism for which she takes Synthroid and is concerned about Hashimoto's thyroiditis.  However, she says her PCP checked her TSH, which has been normal.  She does have longstanding history of chronic daily headaches, for which she [facility].  Current analgesics:  BC powder Current anti-emetic: promethazine 25mg  Current antidepressant:  Doxepin 50mg  PRN Current antiepileptic:  Gabapentin 800mg  three times daily Other medication:  Seroquel, Cogentin, Synthroid  PAST MEDICAL HISTORY: Past Medical History:  Diagnosis Date  . Alcohol abuse   . PTSD (post-traumatic stress disorder)     PAST SURGICAL HISTORY: No past surgical history on file.  MEDICATIONS: Current Outpatient Medications on File Prior to Visit  Medication Sig Dispense Refill  . Amino Acids (AMINO ACID PO) Take 1 tablet by mouth daily.    . Aspirin-Salicylamide-Caffeine (BC HEADACHE PO) Take 6-7 packets by mouth daily as needed (headache).    . benztropine (COGENTIN) 0.5 MG tablet Take 0.5 mg by mouth at bedtime.  0  . calcium carbonate (OSCAL) 1500 (600 Ca) MG TABS tablet Take 600 mg of elemental calcium by mouth daily with breakfast.    . doxepin (SINEQUAN) 50 MG capsule Take 50 mg by mouth at bedtime.  0  . gabapentin (NEURONTIN) 400 MG capsule Take 2 capsules (800 mg total) by mouth 4 (four) times daily -  with meals and at bedtime. (Patient taking differently: Take 400 mg by mouth 4 (four) times daily -  with meals and at bedtime. ) 30 capsule 0  . Multiple Vitamins-Minerals (MULTIVITAMIN WITH MINERALS) tablet Take  1 tablet by mouth daily.    . Omega-3 Fatty Acids (FISH OIL OMEGA-3 PO) Take 1 tablet by mouth daily.    Marland Kitchen omeprazole (PRILOSEC) 40 MG capsule Take 40 mg by mouth daily.  0  . pantoprazole (PROTONIX) 40 MG tablet Take 40 mg by mouth daily.    . promethazine (PHENERGAN) 25 MG tablet Take 25 mg by mouth daily as needed.  0  . QUEtiapine (SEROQUEL) 100 MG tablet  Take 1 tablet (100 mg total) by mouth 2 (two) times daily. (Patient taking differently: Take 100-400 mg by mouth See admin instructions. Take 100 mg in the morning and at lunch and 400 mg at bedtime) 60 tablet 0  . SYNTHROID 50 MCG tablet Take 1 tablet (50 mcg total) by mouth daily. 30 tablet 3   No current facility-administered medications on file prior to visit.     ALLERGIES: Allergies  Allergen Reactions  . Morphine And Related Other (See Comments)    "Makes me crazy"  . Morphine And Related Other (See Comments)    "It is bad"  . Morphine And Related   . Trazodone And Nefazodone Other (See Comments)    FAMILY HISTORY: Family History  Problem Relation Age of Onset  . Cancer Mother   . Breast cancer Maternal Grandmother   . Cancer Maternal Grandmother    SOCIAL HISTORY: Social History   Socioeconomic History  . Marital status: Single    Spouse name: Not on file  . Number of children: Not on file  . Years of education: Not on file  . Highest education level: Not on file  Occupational History  . Not on file  Social Needs  . Financial resource strain: Not on file  . Food insecurity:    Worry: Not on file    Inability: Not on file  . Transportation needs:    Medical: Not on file    Non-medical: Not on file  Tobacco Use  . Smoking status: Current Every Day Smoker    Packs/day: 1.00  . Smokeless tobacco: Never Used  Substance and Sexual Activity  . Alcohol use: Not Currently    Comment: recovering alcoholic  . Drug use: No  . Sexual activity: Not Currently  Lifestyle  . Physical activity:    Days per week: Not on file    Minutes per session: Not on file  . Stress: Not on file  Relationships  . Social connections:    Talks on phone: Not on file    Gets together: Not on file    Attends religious service: Not on file    Active member of club or organization: Not on file    Attends meetings of clubs or organizations: Not on file    Relationship status: Not on  file  . Intimate partner violence:    Fear of current or ex partner: Not on file    Emotionally abused: Not on file    Physically abused: Not on file    Forced sexual activity: Not on file  Other Topics Concern  . Not on file  Social History Narrative   ** Merged History Encounter **       ** Merged History Encounter **        REVIEW OF SYSTEMS: Constitutional: No fevers, chills, or sweats, no generalized fatigue, change in appetite Eyes: No visual changes, double vision, eye pain Ear, nose and throat: No hearing loss, ear pain, nasal congestion, sore throat Cardiovascular: No chest pain, palpitations Respiratory:  No shortness of breath at rest or with exertion, wheezes GastrointestinaI: No nausea, vomiting, diarrhea, abdominal pain, fecal incontinence Genitourinary:  No dysuria, urinary retention or frequency Musculoskeletal:  No neck pain, back pain Integumentary: No rash, pruritus, skin lesions Neurological: as above Psychiatric: Depression, mood disorder Endocrine: as above Hematologic/Lymphatic:  No purpura, petechiae. Allergic/Immunologic: no itchy/runny eyes, nasal congestion, recent allergic reactions, rashes  PHYSICAL EXAM: Blood pressure 108/68, pulse 74, temperature 98.4 F (36.9 C), height 5\' 6"  (1.676 m), weight 178 lb (80.7 kg), SpO2 97 %, unknown if currently breastfeeding. General: No acute distress.  Patient appears well-groomed.  Head:  Normocephalic/atraumatic Eyes:  fundi examined but not visualized Neck: supple, no paraspinal tenderness, full range of motion Back: No paraspinal tenderness Heart: regular rate and rhythm Lungs: Clear to auscultation bilaterally. Vascular: No carotid bruits. Neurological Exam: Mental status: alert and oriented to person, place, and time, recent and remote memory intact, fund of knowledge intact, attention and concentration intact, speech fluent and not dysarthric, language intact.  She was unable to correctly complete the  Trail Making Test B but otherwise visuospatial and executive functioning intact. Montreal Cognitive Assessment  03/10/2019  Visuospatial/ Executive (0/5) 4  Naming (0/3) 3  Attention: Read list of digits (0/2) 2  Attention: Read list of letters (0/1) 1  Attention: Serial 7 subtraction starting at 100 (0/3) 3  Language: Repeat phrase (0/2) 1  Language : Fluency (0/1) 1  Abstraction (0/2) 2  Delayed Recall (0/5) 5  Orientation (0/6) 6  Total 28   Cranial nerves: CN I: not tested CN II: pupils equal, round and reactive to light, visual fields intact CN III, IV, VI:  full range of motion, no nystagmus, no ptosis CN V: facial sensation intact CN VII: upper and lower face symmetric CN VIII: hearing intact CN IX, X: gag intact, uvula midline CN XI: sternocleidomastoid and trapezius muscles intact CN XII: tongue midline Bulk & Tone: normal, no fasciculations. Motor:  5/5 throughout  Sensation:  Pinprick and vibration sensation intact. Deep Tendon Reflexes:  2+ throughout, toes downgoing.  Finger to nose testing:  Without dysmetria.  Heel to shin:  Without dysmetria.  Gait:  Normal station and stride.  Able to turn and tandem walk. Romberg negative.  IMPRESSION: 1.  Episodic headache with confusion/delirium.  Given the normal TSH, low suspicion for Hashimoto's encephalopathy.  Consider complicated migraines.  Also given episodes of memory gaps, consider atypical seizure. 2.  Chronic daily headache complicated by rebound headache 3.  At baseline, she reports some short term memory deficits (possibly related to history of alcoholism) but testing does not demonstrate cognitive impairment. 4. Tobacco use disorder   PLAN: 1.  Sleep deprived EEG 2.  Limit use of pain relievers to no more than 2 days out of week to prevent risk of rebound or medication-overuse headache. 3.  Tobacco cessation counseling (CPT 99406):  Tobacco with no history of CAD, stroke, or cancer  - Currently smoking 1  packs/day   - Patient was informed of the dangers of tobacco abuse including stroke, cancer, and MI, as well as benefits of tobacco cessation. - Patient is willing to quit at this time. - Approximately 5 mins were spent counseling patient cessation techniques. We discussed various methods to help quit smoking, including deciding on a date to quit, joining a support group, pharmacological agents- nicotine gum/patch/lozenges, chantix.  - I will reassess her progress at the next follow-up visit 4.  Follow up after testing.  Thank you for allowing  me to take part in the care of this patient.  Shon Millet, DO  CC:  Lavinia Sharps, NP

## 2019-03-14 ENCOUNTER — Other Ambulatory Visit: Payer: Self-pay

## 2019-03-14 ENCOUNTER — Encounter: Payer: Self-pay | Admitting: Neurology

## 2019-03-14 ENCOUNTER — Ambulatory Visit (INDEPENDENT_AMBULATORY_CARE_PROVIDER_SITE_OTHER): Payer: Self-pay | Admitting: Neurology

## 2019-03-14 VITALS — BP 108/68 | HR 74 | Temp 98.4°F | Ht 66.0 in | Wt 178.0 lb

## 2019-03-14 DIAGNOSIS — R404 Transient alteration of awareness: Secondary | ICD-10-CM

## 2019-03-14 DIAGNOSIS — R51 Headache: Secondary | ICD-10-CM

## 2019-03-14 DIAGNOSIS — R519 Headache, unspecified: Secondary | ICD-10-CM

## 2019-03-14 DIAGNOSIS — F17200 Nicotine dependence, unspecified, uncomplicated: Secondary | ICD-10-CM

## 2019-03-14 NOTE — Patient Instructions (Signed)
1.  We will check a sleep-deprived EEG 2.  Follow up after testing

## 2019-03-26 ENCOUNTER — Ambulatory Visit (INDEPENDENT_AMBULATORY_CARE_PROVIDER_SITE_OTHER): Payer: Self-pay | Admitting: Neurology

## 2019-03-26 ENCOUNTER — Other Ambulatory Visit: Payer: Self-pay

## 2019-03-26 DIAGNOSIS — R404 Transient alteration of awareness: Secondary | ICD-10-CM

## 2019-03-27 ENCOUNTER — Telehealth: Payer: Self-pay

## 2019-03-27 NOTE — Telephone Encounter (Signed)
-----   Message from Pieter Partridge, DO sent at 03/27/2019 11:53 AM EDT ----- eeg is normal

## 2019-03-27 NOTE — Procedures (Signed)
ELECTROENCEPHALOGRAM REPORT  Date of Study: 03/26/2019  Patient's Name: Mary Dodson MRN: [id number] Date of Birth: 07/04/1966  Referring Provider: Metta Clines, DO  Clinical History: 53 year old woman with episodic headaches associated with delirium.  Medications: Cogentin Sinequan Phenergan  Technical Summary: A multichannel digital EEG recording measured by the international 10-20 system with electrodes applied with paste and impedances below 5000 ohms performed in our laboratory with EKG monitoring in an awake and asleep patient.  Hyperventilation not performed as patient wearing mask due to COVID-19 precautions.  Photic stimulation was performed.  The digital EEG was referentially recorded, reformatted, and digitally filtered in a variety of bipolar and referential montages for optimal display.    Description: The patient is awake and asleep during the recording.  During maximal wakefulness, there is a symmetric, medium voltage 10 Hz posterior dominant rhythm that attenuates with eye opening.  The record is symmetric.  During drowsiness and sleep, there is an increase in theta slowing of the background.  Vertex waves and symmetric sleep spindles were seen.  Photic stimulation did not elicit any abnormalities.  There were no epileptiform discharges or electrographic seizures seen.    EKG lead was unremarkable.  Impression: This awake and asleep EEG is normal.    Clinical Correlation: A normal EEG does not exclude a clinical diagnosis of epilepsy.  If further clinical questions remain, prolonged EEG may be helpful.  Clinical correlation is advised.   Metta Clines, DO

## 2019-03-27 NOTE — Telephone Encounter (Signed)
Called and LMOVM advising Pt of EEG results, also sent My chart message

## 2019-04-05 ENCOUNTER — Ambulatory Visit
Admission: RE | Admit: 2019-04-05 | Discharge: 2019-04-05 | Disposition: A | Discharge status: Home / Self Care | Payer: No Typology Code available for payment source | Source: Ambulatory Visit | Attending: *Deleted | Admitting: *Deleted

## 2019-04-05 ENCOUNTER — Other Ambulatory Visit: Payer: Self-pay

## 2019-04-05 DIAGNOSIS — Z1231 Encounter for screening mammogram for malignant neoplasm of breast: Secondary | ICD-10-CM

## 2019-04-21 ENCOUNTER — Other Ambulatory Visit: Payer: Self-pay

## 2019-04-21 ENCOUNTER — Inpatient Hospital Stay (HOSPITAL_COMMUNITY)
Admission: EM | Admit: 2019-04-21 | Discharge: 2019-04-28 | DRG: 208 | Discharge status: Against Medical Advice | Payer: Self-pay | Attending: Pulmonary Disease | Admitting: Pulmonary Disease

## 2019-04-21 ENCOUNTER — Emergency Department (HOSPITAL_COMMUNITY): Payer: Self-pay

## 2019-04-21 ENCOUNTER — Encounter (HOSPITAL_COMMUNITY): Payer: Self-pay

## 2019-04-21 DIAGNOSIS — T39091A Poisoning by salicylates, accidental (unintentional), initial encounter: Secondary | ICD-10-CM | POA: Diagnosis present

## 2019-04-21 DIAGNOSIS — Z79899 Other long term (current) drug therapy: Secondary | ICD-10-CM

## 2019-04-21 DIAGNOSIS — D649 Anemia, unspecified: Secondary | ICD-10-CM | POA: Diagnosis present

## 2019-04-21 DIAGNOSIS — E872 Acidosis: Secondary | ICD-10-CM | POA: Diagnosis present

## 2019-04-21 DIAGNOSIS — T39095A Adverse effect of salicylates, initial encounter: Secondary | ICD-10-CM | POA: Diagnosis present

## 2019-04-21 DIAGNOSIS — R4182 Altered mental status, unspecified: Secondary | ICD-10-CM

## 2019-04-21 DIAGNOSIS — Z885 Allergy status to narcotic agent status: Secondary | ICD-10-CM

## 2019-04-21 DIAGNOSIS — Z888 Allergy status to other drugs, medicaments and biological substances status: Secondary | ICD-10-CM

## 2019-04-21 DIAGNOSIS — R410 Disorientation, unspecified: Secondary | ICD-10-CM

## 2019-04-21 DIAGNOSIS — R296 Repeated falls: Secondary | ICD-10-CM | POA: Diagnosis present

## 2019-04-21 DIAGNOSIS — Z590 Homelessness: Secondary | ICD-10-CM

## 2019-04-21 DIAGNOSIS — J9621 Acute and chronic respiratory failure with hypoxia: Secondary | ICD-10-CM | POA: Diagnosis present

## 2019-04-21 DIAGNOSIS — Z4659 Encounter for fitting and adjustment of other gastrointestinal appliance and device: Secondary | ICD-10-CM

## 2019-04-21 DIAGNOSIS — E876 Hypokalemia: Secondary | ICD-10-CM | POA: Diagnosis present

## 2019-04-21 DIAGNOSIS — R0603 Acute respiratory distress: Secondary | ICD-10-CM

## 2019-04-21 DIAGNOSIS — F17210 Nicotine dependence, cigarettes, uncomplicated: Secondary | ICD-10-CM | POA: Diagnosis present

## 2019-04-21 DIAGNOSIS — E039 Hypothyroidism, unspecified: Secondary | ICD-10-CM | POA: Diagnosis present

## 2019-04-21 DIAGNOSIS — Z01818 Encounter for other preprocedural examination: Secondary | ICD-10-CM

## 2019-04-21 DIAGNOSIS — Z20828 Contact with and (suspected) exposure to other viral communicable diseases: Secondary | ICD-10-CM | POA: Diagnosis present

## 2019-04-21 DIAGNOSIS — F1020 Alcohol dependence, uncomplicated: Secondary | ICD-10-CM | POA: Diagnosis present

## 2019-04-21 DIAGNOSIS — Z7982 Long term (current) use of aspirin: Secondary | ICD-10-CM

## 2019-04-21 DIAGNOSIS — G9341 Metabolic encephalopathy: Secondary | ICD-10-CM | POA: Diagnosis present

## 2019-04-21 DIAGNOSIS — F4310 Post-traumatic stress disorder, unspecified: Secondary | ICD-10-CM | POA: Diagnosis present

## 2019-04-21 DIAGNOSIS — F312 Bipolar disorder, current episode manic severe with psychotic features: Secondary | ICD-10-CM | POA: Diagnosis present

## 2019-04-21 DIAGNOSIS — Z781 Physical restraint status: Secondary | ICD-10-CM

## 2019-04-21 DIAGNOSIS — E519 Thiamine deficiency, unspecified: Secondary | ICD-10-CM | POA: Diagnosis present

## 2019-04-21 DIAGNOSIS — G9340 Encephalopathy, unspecified: Secondary | ICD-10-CM | POA: Diagnosis present

## 2019-04-21 DIAGNOSIS — Z79890 Hormone replacement therapy (postmenopausal): Secondary | ICD-10-CM

## 2019-04-21 DIAGNOSIS — T39011A Poisoning by aspirin, accidental (unintentional), initial encounter: Secondary | ICD-10-CM | POA: Diagnosis present

## 2019-04-21 DIAGNOSIS — J449 Chronic obstructive pulmonary disease, unspecified: Secondary | ICD-10-CM | POA: Diagnosis present

## 2019-04-21 DIAGNOSIS — J189 Pneumonia, unspecified organism: Principal | ICD-10-CM | POA: Diagnosis present

## 2019-04-21 HISTORY — DX: Bipolar disorder, unspecified: F31.9

## 2019-04-21 HISTORY — DX: Hypothyroidism, unspecified: E03.9

## 2019-04-21 LAB — CBC WITH DIFFERENTIAL/PLATELET
Abs Immature Granulocytes: 0.06 10*3/uL (ref 0.00–0.07)
Basophils Absolute: 0.1 10*3/uL (ref 0.0–0.1)
Basophils Relative: 1 %
Eosinophils Absolute: 0.1 10*3/uL (ref 0.0–0.5)
Eosinophils Relative: 1 %
HCT: 33.4 % — ABNORMAL LOW (ref 36.0–46.0)
Hemoglobin: 10.9 g/dL — ABNORMAL LOW (ref 12.0–15.0)
Immature Granulocytes: 1 %
Lymphocytes Relative: 15 %
Lymphs Abs: 1.4 10*3/uL (ref 0.7–4.0)
MCH: 29.3 pg (ref 26.0–34.0)
MCHC: 32.6 g/dL (ref 30.0–36.0)
MCV: 89.8 fL (ref 80.0–100.0)
Monocytes Absolute: 0.7 10*3/uL (ref 0.1–1.0)
Monocytes Relative: 7 %
Neutro Abs: 7.1 10*3/uL (ref 1.7–7.7)
Neutrophils Relative %: 75 %
Platelets: 264 10*3/uL (ref 150–400)
RBC: 3.72 MIL/uL — ABNORMAL LOW (ref 3.87–5.11)
RDW: 17.1 % — ABNORMAL HIGH (ref 11.5–15.5)
WBC: 9.4 10*3/uL (ref 4.0–10.5)
nRBC: 0 % (ref 0.0–0.2)

## 2019-04-21 LAB — COMPREHENSIVE METABOLIC PANEL
ALT: 22 U/L (ref 0–44)
AST: 117 U/L — ABNORMAL HIGH (ref 15–41)
Albumin: 3.4 g/dL — ABNORMAL LOW (ref 3.5–5.0)
Alkaline Phosphatase: 73 U/L (ref 38–126)
Anion gap: 14 (ref 5–15)
BUN: 15 mg/dL (ref 6–20)
CO2: 19 mmol/L — ABNORMAL LOW (ref 22–32)
Calcium: 8.2 mg/dL — ABNORMAL LOW (ref 8.9–10.3)
Chloride: 108 mmol/L (ref 98–111)
Creatinine, Ser: 0.93 mg/dL (ref 0.44–1.00)
GFR calc Af Amer: >60 mL/min (ref >60)
GFR calc non Af Amer: >60 mL/min (ref >60)
Glucose, Bld: 109 mg/dL — ABNORMAL HIGH (ref 70–99)
Potassium: 3.4 mmol/L — ABNORMAL LOW (ref 3.5–5.1)
Sodium: 141 mmol/L (ref 135–145)
Total Bilirubin: 0.6 mg/dL (ref 0.3–1.2)
Total Protein: 6.3 g/dL — ABNORMAL LOW (ref 6.5–8.1)

## 2019-04-21 LAB — I-STAT BETA HCG BLOOD, ED (MC, WL, AP ONLY): I-stat hCG, quantitative: 9.4 m[IU]/mL — ABNORMAL HIGH (ref <5)

## 2019-04-21 LAB — ACETAMINOPHEN LEVEL: Acetaminophen (Tylenol), Serum: <10 ug/mL — ABNORMAL LOW (ref 10–30)

## 2019-04-21 LAB — LACTIC ACID, PLASMA: Lactic Acid, Venous: 0.8 mmol/L (ref 0.5–1.9)

## 2019-04-21 LAB — ETHANOL: Alcohol, Ethyl (B): <10 mg/dL (ref <10)

## 2019-04-21 LAB — CBG MONITORING, ED: Glucose-Capillary: 103 mg/dL — ABNORMAL HIGH (ref 70–99)

## 2019-04-21 LAB — SALICYLATE LEVEL: Salicylate Lvl: 41.1 mg/dL (ref 2.8–30.0)

## 2019-04-21 LAB — AMMONIA: Ammonia: 26 umol/L (ref 9–35)

## 2019-04-21 MED ORDER — ACETAMINOPHEN 650 MG RE SUPP: 650.0000 mg | Freq: Once | RECTAL | Status: DC

## 2019-04-21 MED ORDER — SODIUM CHLORIDE 0.9 % IV SOLN
500.0000 mg | INTRAVENOUS | Status: DC
  Administered 2019-04-21: 500 mg via INTRAVENOUS
  Filled 2019-04-21 (×2): qty 500

## 2019-04-21 MED ORDER — SODIUM CHLORIDE 0.9 % IV BOLUS
1000.0000 mL | Freq: Once | INTRAVENOUS | Status: AC
  Administered 2019-04-21: 1000 mL via INTRAVENOUS

## 2019-04-21 MED ORDER — SODIUM CHLORIDE 0.9 % IV SOLN
2.0000 g | INTRAVENOUS | Status: DC
  Administered 2019-04-21: 2 g via INTRAVENOUS
  Filled 2019-04-21 (×2): qty 20

## 2019-04-21 MED ORDER — LORAZEPAM 2 MG/ML IJ SOLN: 2.0000 mg | Freq: Once | INTRAMUSCULAR | Status: DC

## 2019-04-21 MED ORDER — ACETAMINOPHEN 325 MG PO TABS
650.0000 mg | ORAL_TABLET | Freq: Once | ORAL | Status: AC
  Administered 2019-04-21: 650 mg via ORAL
  Filled 2019-04-21: qty 2

## 2019-04-21 MED ORDER — LORAZEPAM 2 MG/ML IJ SOLN
1.0000 mg | Freq: Once | INTRAMUSCULAR | Status: AC
  Administered 2019-04-21: 1 mg via INTRAVENOUS
  Filled 2019-04-21: qty 1

## 2019-04-21 NOTE — ED Provider Notes (Signed)
[facility] EMERGENCY DEPARTMENT Provider Note   CSN: [id number] Arrival date & time: 04/21/19  2018    History   Chief Complaint Chief Complaint  Patient presents with  . Altered Mental Status    HPI Abimbola Aki is a 53 y.o. female.     HPI   Level 5 caveat due to altered mental status.  Tatisha Cerino is a 53 y.o. female, with a history of alcohol abuse, presenting to the ED with altered mental status.  Initial information via EMS is poor.  Patient's friend reportedly called EMS stating the patient has been altered and worsening since July 9. Upon my initial interview, patient was standing at the bedside fiddling with the bedside suction canister.  Her gown had fallen off and she is topless. She continued to demand water and was not answering questions until she got some water. She would become intermittently agitated and exclaim, "This is ridiculous.  This is the most ridiculous thing I've ever experienced." When asked about alcohol use, patient replied, "That has nothing to do with this situation! Three and a half years has nothing to do with this situation." When asked to clarify what she meant by this, she exclaims, "There is nothing with the current contamination that has to do with that! You are fucking with my head and I don't like it!"     Past Medical History:  Diagnosis Date  . Alcohol abuse   . PTSD (post-traumatic stress disorder)     Patient Active Problem List   Diagnosis Date Noted  . ASCUS of cervix with negative high risk HPV 03/19/2018  . Helicobacter pylori gastritis 03/19/2018  . Hypothyroidism 03/19/2018  . Bipolar disorder, current episode manic severe with psychotic features (HCC) 10/06/2016  . Alcohol use disorder, severe, dependence (HCC) 10/06/2016  . Alcohol use disorder, severe, dependence (HCC) 09/26/2016  . Major depressive disorder with single episode 09/26/2016  . Acute encephalopathy 09/20/2016  . Airway  intubation performed without difficulty   . Hypothermia   . Alcoholic intoxication with complication (HCC)   . COPD with acute exacerbation (HCC)     History reviewed. No pertinent surgical history.   OB History    Gravida  4   Para  0   Term  0   Preterm  0   AB  1   Living        SAB  1   TAB  0   Ectopic  0   Multiple      Live Births  2            Home Medications    Prior to Admission medications   Medication Sig Start Date End Date Taking? Authorizing Provider  albuterol (VENTOLIN HFA) 108 (90 Base) MCG/ACT inhaler Inhale 2 puffs into the lungs every 6 (six) hours as needed for wheezing or shortness of breath.    [provider]  Amino Acids (AMINO ACID PO) Take 1 tablet by mouth daily.    [provider]  Aspirin-Salicylamide-Caffeine (BC HEADACHE PO) Take 6-7 packets by mouth daily as needed (headache).    [provider]  benztropine (COGENTIN) 0.5 MG tablet Take 0.5 mg by mouth at bedtime. 02/20/18   [provider]  calcium carbonate (OSCAL) 1500 (600 Ca) MG TABS tablet Take 600 mg of elemental calcium by mouth daily with breakfast.    [provider]  doxepin (SINEQUAN) 75 MG capsule Take 75 mg by mouth at bedtime as needed (sleep).  04/07/19   [provider]  gabapentin (NEURONTIN) 400 MG capsule Take 2 capsules (800 mg total) by mouth 4 (four) times daily -  with meals and at bedtime. Patient taking differently: Take 400 mg by mouth 4 (four) times daily -  with meals and at bedtime.  10/06/16   Derrill Center, NP  ibuprofen (ADVIL) 800 MG tablet Take 800 mg by mouth 3 (three) times daily as needed for fever or headache (pain).  04/17/19   [provider]  Multiple Vitamins-Minerals (MULTIVITAMIN WITH MINERALS) tablet Take 1 tablet by mouth daily.    [provider]  Omega-3 Fatty Acids (FISH OIL OMEGA-3 PO) Take 1 tablet by mouth daily.    [provider]  omeprazole  (PRILOSEC) 40 MG capsule Take 40 mg by mouth daily. 05/27/18   [provider]  promethazine (PHENERGAN) 25 MG tablet Take 25 mg by mouth daily as needed for nausea or vomiting.  05/27/18   [provider]  QUEtiapine (SEROQUEL) 100 MG tablet Take 1 tablet (100 mg total) by mouth 2 (two) times daily. Patient taking differently: Take 100 mg by mouth 2 (two) times daily with breakfast and lunch. Also take 400 mg at bedtime 10/06/16   Derrill Center, NP  QUEtiapine (SEROQUEL) 400 MG tablet Take 400 mg by mouth at bedtime. Also take 100 mg with breakfast and lunch 04/14/19   [provider]  sertraline (ZOLOFT) 100 MG tablet Take 100 mg by mouth daily after breakfast. 04/14/19   [provider]  SYNTHROID 50 MCG tablet Take 1 tablet (50 mcg total) by mouth daily. 03/20/18   Charlott Rakes, MD    Family History Family History  Problem Relation Age of Onset  . Cancer Mother        suicide  . Breast cancer Maternal Grandmother   . Cancer Maternal Grandmother   . Heart attack Father     Social History Social History   Tobacco Use  . Smoking status: Current Every Day Smoker    Packs/day: 1.00    Types: Cigarettes  . Smokeless tobacco: Never Used  Substance Use Topics  . Alcohol use: Not Currently    Comment: recovering alcoholic  . Drug use: No     Allergies   Morphine and related, Morphine and related, Morphine and related, and Trazodone and nefazodone   Review of Systems Review of Systems  Unable to perform ROS: Mental status change     Physical Exam Updated Vital Signs BP (!) 111/51   Pulse 89   Resp 15   SpO2 96%   Blood pressure (!) 100/50, pulse 88, temperature (!) 103 F (39.4 C), temperature source Rectal, resp. rate 17, SpO2 94 %, unknown if currently breastfeeding.  There is no height or weight on file to calculate BMI.  Physical Exam Vitals signs and nursing note reviewed.  Constitutional:      General: She is not in acute  distress.    Appearance: She is well-developed. She is not diaphoretic.  HENT:     Head: Normocephalic and atraumatic.     Mouth/Throat:     Mouth: Mucous membranes are moist.     Pharynx: Oropharynx is clear.  Eyes:     Conjunctiva/sclera: Conjunctivae normal.  Neck:     Musculoskeletal: Neck supple.  Cardiovascular:     Rate and Rhythm: Normal rate and regular rhythm.     Pulses: Normal pulses.          Radial pulses  are 2+ on the right side and 2+ on the left side.       Posterior tibial pulses are 2+ on the right side and 2+ on the left side.     Heart sounds: Normal heart sounds.     Comments: Tactile temperature in the extremities appropriate and equal bilaterally. Pulmonary:     Effort: Pulmonary effort is normal. No respiratory distress.     Breath sounds: Normal breath sounds.  Abdominal:     Palpations: Abdomen is soft.     Tenderness: There is no abdominal tenderness. There is no guarding.  Musculoskeletal:     Right lower leg: No edema.     Left lower leg: No edema.     Comments: Normal motor function intact in all extremities. No midline spinal tenderness.   Overall trauma exam performed without any abnormalities noted other than those mentioned.  Lymphadenopathy:     Cervical: No cervical adenopathy.  Skin:    General: Skin is warm and dry.  Neurological:     Mental Status: She is alert.     Comments: Patient is readily ambulatory, but agitated.  She will not give direct answers to questions. She appears to move her extremities equally bilaterally.  Psychiatric:        Behavior: Behavior is agitated.      ED Treatments / Results  Labs (all labs ordered are listed, but only abnormal results are displayed) Labs Reviewed  CBC WITH DIFFERENTIAL/PLATELET - Abnormal; Notable for the following components:      Result Value   RBC 3.72 (*)    Hemoglobin 10.9 (*)    HCT 33.4 (*)    RDW 17.1 (*)    All other components within normal limits  COMPREHENSIVE  METABOLIC PANEL - Abnormal; Notable for the following components:   Potassium 3.4 (*)    CO2 19 (*)    Glucose, Bld 109 (*)    Calcium 8.2 (*)    Total Protein 6.3 (*)    Albumin 3.4 (*)    AST 117 (*)    All other components within normal limits  ACETAMINOPHEN LEVEL - Abnormal; Notable for the following components:   Acetaminophen (Tylenol), Serum <10 (*)    All other components within normal limits  SALICYLATE LEVEL - Abnormal; Notable for the following components:   Salicylate Lvl 41.1 (*)    All other components within normal limits  CBG MONITORING, ED - Abnormal; Notable for the following components:   Glucose-Capillary 103 (*)    All other components within normal limits  I-STAT BETA HCG BLOOD, ED ([facility], [facility], [facility] ONLY) - Abnormal; Notable for the following components:   I-stat hCG, quantitative 9.4 (*)    All other components within normal limits  POCT I-STAT 7, (LYTES, BLD GAS, ICA,H+H) - Abnormal; Notable for the following components:   pO2, Arterial 66.0 (*)    Bicarbonate 18.7 (*)    TCO2 20 (*)    Acid-base deficit 6.0 (*)    Calcium, Ion 1.05 (*)    HCT 33.0 (*)    Hemoglobin 11.2 (*)    All other components within normal limits  SARS CORONAVIRUS 2 (HOSPITAL ORDER, PERFORMED IN [facility])  CULTURE, BLOOD (ROUTINE X 2)  CULTURE, BLOOD (ROUTINE X 2)  URINE CULTURE  LACTIC ACID, PLASMA  ETHANOL  AMMONIA  URINALYSIS, ROUTINE W REFLEX MICROSCOPIC  LACTIC ACID, PLASMA  RAPID URINE DRUG SCREEN, HOSP PERFORMED  APTT  PROTIME-INR  TSH  SALICYLATE  LEVEL  BASIC METABOLIC PANEL  URINALYSIS, ROUTINE W REFLEX MICROSCOPIC  URINALYSIS, ROUTINE W REFLEX MICROSCOPIC  URINALYSIS, ROUTINE W REFLEX MICROSCOPIC  URINALYSIS, ROUTINE W REFLEX MICROSCOPIC  URINALYSIS, ROUTINE W REFLEX MICROSCOPIC  SEDIMENTATION RATE  C-REACTIVE PROTEIN  FERRITIN  D-DIMER, QUANTITATIVE (NOT [facility])  PROCALCITONIN  ETHYLENE GLYCOL  I-STAT ARTERIAL BLOOD GAS, ED   Hemoglobin   Date Value Ref Range Status  04/22/2019 11.2 (L) 12.0 - 15.0 g/dL Final  16/10/960407/13/2020 54.010.9 (L) 12.0 - 15.0 g/dL Final  98/11/914712/24/2019 82.911.2 (L) 12.0 - 15.0 g/dL Final  56/21/308612/24/2019 57.810.8 (L) 12.0 - 15.0 g/dL Final    EKG None  ED ECG REPORT   Date: 04/22/2019  Rate: 88  Rhythm: Sinus rhythm  QRS Axis: normal  Intervals: normal  ST/T Wave abnormalities: normal  Conduction Disutrbances:none  Narrative Interpretation:   Old EKG Reviewed: unchanged  I have personally reviewed the EKG tracing and agree with the computerized printout as noted.  Radiology Dg Chest Portable 1 View  Result Date: 04/21/2019 CLINICAL DATA:  53 year old female with multiple falls, fever, altered mental status. EXAM: PORTABLE CHEST 1 VIEW COMPARISON:  Portable chest 10/01/2016 and earlier. FINDINGS: Portable AP semi upright view at 2145 hours. Lower lung volumes with patchy and indistinct bilateral multifocal pulmonary opacity. Mildly lower lung volumes. Mediastinal contours remain normal. Visualized tracheal air column is within normal limits. No pneumothorax or pleural effusion. Negative visible bowel gas pattern. No acute osseous abnormality identified. IMPRESSION: Patchy and indistinct bilateral pulmonary opacity suspicious for bilateral pneumonia. Consider viral/atypical etiology. No pleural effusion. Electronically Signed   By: Odessa FlemingH  Hall M.D.   On: 04/21/2019 21:58    Procedures .Critical Care Performed by: Anselm PancoastJoy, Ahmet Schank C, PA-C Authorized by: Anselm PancoastJoy, Aadvika Konen C, PA-C   Critical care provider statement:    Critical care time (minutes):  45   Critical care time was exclusive of:  Separately billable procedures and treating other patients   Critical care was necessary to treat or prevent imminent or life-threatening deterioration of the following conditions:  Toxidrome   Critical care was time spent personally by me on the following activities:  Development of treatment plan with patient or surrogate, discussions with  consultants, evaluation of patient's response to treatment, examination of patient, obtaining history from patient or surrogate, ordering and performing treatments and interventions, ordering and review of laboratory studies, ordering and review of radiographic studies, pulse oximetry, re-evaluation of patient's condition and review of old charts   I assumed direction of critical care for this patient from another provider in my specialty: no     (including critical care time)  Medications Ordered in ED Medications  cefTRIAXone (ROCEPHIN) 2 g in sodium chloride 0.9 % 100 mL IVPB (0 g Intravenous Stopped 04/22/19 0046)  azithromycin (ZITHROMAX) 500 mg in sodium chloride 0.9 % 250 mL IVPB (500 mg Intravenous New Bag/Given 04/21/19 2351)  lactated ringers bolus 1,000 mL (has no administration in time range)  sodium bicarbonate injection 80 mEq (has no administration in time range)  dextrose 5 % 900 mL with potassium chloride 40 mEq, sodium bicarbonate 100 mEq infusion ( Intravenous New Bag/Given 04/22/19 0114)  sodium chloride 0.9 % bolus 1,000 mL (0 mLs Intravenous Stopped 04/22/19 0120)  acetaminophen (TYLENOL) tablet 650 mg (650 mg Oral Given 04/21/19 2345)  LORazepam (ATIVAN) injection 1 mg (1 mg Intravenous Given 04/21/19 2354)       Initial Impression / Assessment and Plan / ED Course  I have reviewed the triage vital  signs and the nursing notes.  Pertinent labs & imaging results that were available during my care of the patient were reviewed by me and considered in my medical decision making (see chart for details).  Clinical Course as of Apr 22 131  Mon Apr 21, 2019  2355 Spoke with Revonda StandardAllison with [place] poison control.  Discussed patient's symptoms and vital signs.Recommends repeating salicylate every 2-3 hours.Consult nephrology. She will fax a copy of the recommended protocol.Administer 1 amp D50 if CBG falls below 100. May attempt administering activated charcoal, even in chronic  salicylate poisoning.   [SJ]  Tue Apr 22, 2019  0008 Spoke with Dr. Glenna FellowsKruska, nephrologist. States she will see the patient.   [SJ]  0022 Dr. Glenna FellowsKruska does not think dialysis is necessary at this time.    [SJ]  0023 Spoke with Vernona RiegerLaura, ED pharmacist.  We discussed recommended bicarb drip mixed with KCl and D5W.  She will take care of ordering this mixture.   [SJ]    Clinical Course User Index [SJ] Stacie Templin C, PA-C       Patient presents with altered mental status.  She is agitated and confused.  Also noted to be febrile. Salicylate elevated.  Chart review reveals patient has stated she takes 6 to 10 packets of BC powders daily. Chest x-ray shows evidence of bilateral pneumonia.  Initially, room air SPO2s were excellent.  We had to administer Ativan due to patient's agitation.  After administration of Ativan, patient had some drop in her SPO2 down to 90% on room air, though she was much more calm.  She was placed on 2 L supplemental O2.  Other possibilities for patient's presentation were considered beyond salicylate poisoning and/or her pneumonia, including serotonin syndrome, thyroid storm, and meningitis, though meningitis thought less likely.  Goals of care and continued management in the setting of salicylate overdose:  Continue to hydrate patient with IV Lactated Ringer's, titrate to urine output 2 cc/kg/hr  Repeat salicylate level, BMP, and urine pH (tested with UA) every 3 hours.  Once condition improves, may test every 6 hours.  Urine alkalinization:   Maintain alkaline urine (pH greater than or equal to 7.5).    Start with 250 cc/h of the mixture of sodium bicarbonate and KCl in the D5W (see poison control protocol above for exact mixing instructions).   Observe for urine output of 2 cc/kg/h.  Findings and plan of care discussed with Chaney Mallingavid Yao, MD. Dr. Silverio LayYao personally evaluated and examined this patient.   End of shift patient care handoff report given to Sharilyn SitesLisa Sanders,  PA-C. Plan: Review patient CT imaging.  Some additional lab work pending.  Admit patient.   Final Clinical Impressions(s) / ED Diagnoses   Final diagnoses:  Confusion  Pneumonia of both lower lobes due to infectious organism [facility](HCC)    ED Discharge Orders    None       Concepcion LivingJoy, Wyatte Dames C, PA-C 04/22/19 0143    Mesner, Barbara CowerJason, MD 04/22/19 0345

## 2019-04-21 NOTE — ED Provider Notes (Signed)
Medical screening examination/treatment/procedure(s) were conducted as a shared visit with non-physician practitioner(s) and myself.  I personally evaluated the patient during the encounter.  Patient is here for altered mental status of unclear etiology.  History is limited secondary to her mental status.  Soundly she had been confused and falling a lot for the last week but then last couple days a significantly worsened.  So EMS brought the patient in.  On my evaluation she has very tangential speech and says this happens every few months.  She denies any neck pain or neck discomfort but she does state that she has headaches off and on that are pretty severe and is why she is taking a bunch of Goody powders. On review of the records, it does appear that she recently started Seroquel at a pretty high dose along with Zoloft.  She denies cough but she coughs while in the room.  She denies abdominal pain and does not have any obvious abdominal tenderness.  She has crackles in the left lower lung fields but overall is moving air okay.  She is febrile with a soft blood pressure but the rest of her vital signs seem to be appropriate.  Many possible causes for the patient's encephalopathy.  Her x-ray shows diffuse opacities consistent with pneumonia of viral bacterial etiology.  Could be coronavirus as we are in the middle of the pandemic.  Could also be aspirin overdose as her aspirin level is pretty high will refer to poison control for recommendations on that.  Also could be meningitis/encephalitis however thought to be less likely as patient's headache is not present currently she is able to range of motion her neck without difficulty and we have an alternative cause for fever.  Plan to continue the work-up and treatment for above differential. Will be admitted.   Angiocath insertion Performed by: Merrily Pew  Consent: Verbal consent obtained. Risks and benefits: risks, benefits and alternatives were  discussed Time out: Immediately prior to procedure a "time out" was called to verify the correct patient, procedure, equipment, support staff and site/side marked as required.  Preparation: Patient was prepped and draped in the usual sterile fashion.  Vein Location: right AC  Ultrasound Guided  Gauge: 20  Normal blood return and flush without difficulty Patient tolerance: Patient tolerated the procedure well with no immediate complications.   CRITICAL CARE Performed by: Merrily Pew Total critical care time: 40 minutes Critical care time was exclusive of separately billable procedures and treating other patients. Critical care was necessary to treat or prevent imminent or life-threatening deterioration. Critical care was time spent personally by me on the following activities: development of treatment plan with patient and/or surrogate as well as nursing, discussions with consultants, evaluation of patient's response to treatment, examination of patient, obtaining history from patient or surrogate, ordering and performing treatments and interventions, ordering and review of laboratory studies, ordering and review of radiographic studies, pulse oximetry and re-evaluation of patient's condition.   My ECG Read Indication: Altered Mental Status EKG was personally contemporaneously reviewed by myself. Rate: 88 PR Interval: 153 QRS duration: 77 QT/QTC: 349/423 Axis: normal EKG: normal EKG, normal sinus rhythm. Other significant findings: none         Petr Bontempo, Corene Cornea, MD 04/22/19 [phone]

## 2019-04-21 NOTE — ED Notes (Signed)
X-ray at bedside

## 2019-04-21 NOTE — ED Triage Notes (Signed)
EMS reports that 911 was called by her friend. Per ems the pt has had multiple falls, pt appears altered, oriented to self. No medication was given per ems. 100*f fever orally, with pressure 100/53.

## 2019-04-21 NOTE — ED Notes (Signed)
IV team at bedside 

## 2019-04-21 NOTE — ED Notes (Signed)
Pt difficult stick, IV team orders placed

## 2019-04-22 ENCOUNTER — Emergency Department (HOSPITAL_COMMUNITY): Payer: Self-pay

## 2019-04-22 ENCOUNTER — Encounter (HOSPITAL_COMMUNITY): Payer: Self-pay | Admitting: Internal Medicine

## 2019-04-22 DIAGNOSIS — T39091A Poisoning by salicylates, accidental (unintentional), initial encounter: Secondary | ICD-10-CM | POA: Diagnosis present

## 2019-04-22 DIAGNOSIS — R410 Disorientation, unspecified: Secondary | ICD-10-CM

## 2019-04-22 DIAGNOSIS — E039 Hypothyroidism, unspecified: Secondary | ICD-10-CM | POA: Diagnosis present

## 2019-04-22 DIAGNOSIS — J9621 Acute and chronic respiratory failure with hypoxia: Secondary | ICD-10-CM | POA: Diagnosis present

## 2019-04-22 DIAGNOSIS — F1010 Alcohol abuse, uncomplicated: Secondary | ICD-10-CM | POA: Insufficient documentation

## 2019-04-22 DIAGNOSIS — F319 Bipolar disorder, unspecified: Secondary | ICD-10-CM | POA: Insufficient documentation

## 2019-04-22 LAB — RAPID URINE DRUG SCREEN, HOSP PERFORMED
Amphetamines: NOT DETECTED
Barbiturates: NOT DETECTED
Benzodiazepines: NOT DETECTED
Cocaine: NOT DETECTED
Opiates: NOT DETECTED
Tetrahydrocannabinol: NOT DETECTED

## 2019-04-22 LAB — SALICYLATE LEVEL
Salicylate Lvl: 30.9 mg/dL (ref 2.8–30.0)
Salicylate Lvl: 22.9 mg/dL (ref 2.8–30.0)

## 2019-04-22 LAB — RENAL FUNCTION PANEL
Albumin: 2.7 g/dL — ABNORMAL LOW (ref 3.5–5.0)
Anion gap: 10 (ref 5–15)
BUN: 12 mg/dL (ref 6–20)
CO2: 20 mmol/L — ABNORMAL LOW (ref 22–32)
Calcium: 7.4 mg/dL — ABNORMAL LOW (ref 8.9–10.3)
Chloride: 114 mmol/L — ABNORMAL HIGH (ref 98–111)
Creatinine, Ser: 0.66 mg/dL (ref 0.44–1.00)
GFR calc Af Amer: >60 mL/min (ref >60)
GFR calc non Af Amer: >60 mL/min (ref >60)
Glucose, Bld: 106 mg/dL — ABNORMAL HIGH (ref 70–99)
Phosphorus: 2.2 mg/dL — ABNORMAL LOW (ref 2.5–4.6)
Potassium: 3.5 mmol/L (ref 3.5–5.1)
Sodium: 144 mmol/L (ref 135–145)

## 2019-04-22 LAB — POCT I-STAT 7, (LYTES, BLD GAS, ICA,H+H)
Acid-base deficit: 6 mmol/L — ABNORMAL HIGH (ref 0.0–2.0)
Bicarbonate: 18.7 mmol/L — ABNORMAL LOW (ref 20.0–28.0)
Calcium, Ion: 1.05 mmol/L — ABNORMAL LOW (ref 1.15–1.40)
HCT: 33 % — ABNORMAL LOW (ref 36.0–46.0)
Hemoglobin: 11.2 g/dL — ABNORMAL LOW (ref 12.0–15.0)
O2 Saturation: 92 %
Potassium: 3.7 mmol/L (ref 3.5–5.1)
Sodium: 142 mmol/L (ref 135–145)
TCO2: 20 mmol/L — ABNORMAL LOW (ref 22–32)
pCO2 arterial: 33.2 mmHg (ref 32.0–48.0)
pH, Arterial: 7.358 (ref 7.350–7.450)
pO2, Arterial: 66 mmHg — ABNORMAL LOW (ref 83.0–108.0)

## 2019-04-22 LAB — T4, FREE: Free T4: 0.42 ng/dL — ABNORMAL LOW (ref 0.61–1.12)

## 2019-04-22 LAB — BASIC METABOLIC PANEL
Anion gap: 12 (ref 5–15)
BUN: 14 mg/dL (ref 6–20)
CO2: 17 mmol/L — ABNORMAL LOW (ref 22–32)
Calcium: 7.5 mg/dL — ABNORMAL LOW (ref 8.9–10.3)
Chloride: 111 mmol/L (ref 98–111)
Creatinine, Ser: 0.79 mg/dL (ref 0.44–1.00)
GFR calc Af Amer: >60 mL/min (ref >60)
GFR calc non Af Amer: >60 mL/min (ref >60)
Glucose, Bld: 105 mg/dL — ABNORMAL HIGH (ref 70–99)
Potassium: 3.2 mmol/L — ABNORMAL LOW (ref 3.5–5.1)
Sodium: 140 mmol/L (ref 135–145)

## 2019-04-22 LAB — SARS CORONAVIRUS 2 BY RT PCR (HOSPITAL ORDER, PERFORMED IN ~~LOC~~ HOSPITAL LAB)
SARS Coronavirus 2: NEGATIVE
SARS Coronavirus 2: NEGATIVE

## 2019-04-22 LAB — MRSA PCR SCREENING: MRSA by PCR: NEGATIVE

## 2019-04-22 LAB — C-REACTIVE PROTEIN: CRP: 16.8 mg/dL — ABNORMAL HIGH (ref <1.0)

## 2019-04-22 LAB — APTT: aPTT: 40 seconds — ABNORMAL HIGH (ref 24–36)

## 2019-04-22 LAB — PROTIME-INR
INR: 1.6 — ABNORMAL HIGH (ref 0.8–1.2)
Prothrombin Time: 18.7 seconds — ABNORMAL HIGH (ref 11.4–15.2)

## 2019-04-22 LAB — FERRITIN: Ferritin: 19 ng/mL (ref 11–307)

## 2019-04-22 LAB — ETHYLENE GLYCOL: Ethylene Glycol Lvl: <5 mg/dL

## 2019-04-22 LAB — PROCALCITONIN: Procalcitonin: <0.1 ng/mL

## 2019-04-22 LAB — AMMONIA: Ammonia: 25 umol/L (ref 9–35)

## 2019-04-22 LAB — D-DIMER, QUANTITATIVE: D-Dimer, Quant: 1.26 ug/mL-FEU — ABNORMAL HIGH (ref 0.00–0.50)

## 2019-04-22 LAB — LACTIC ACID, PLASMA: Lactic Acid, Venous: 0.7 mmol/L (ref 0.5–1.9)

## 2019-04-22 LAB — SEDIMENTATION RATE: Sed Rate: 48 mm/hr — ABNORMAL HIGH (ref 0–22)

## 2019-04-22 LAB — TSH: TSH: 0.21 u[IU]/mL — ABNORMAL LOW (ref 0.350–4.500)

## 2019-04-22 MED ORDER — SODIUM BICARBONATE 8.4 % IV SOLN
INTRAVENOUS | Status: DC
  Administered 2019-04-22 – 2019-04-25 (×7): via INTRAVENOUS
  Filled 2019-04-22 (×17): qty 900

## 2019-04-22 MED ORDER — LEVOTHYROXINE SODIUM 50 MCG PO TABS
50.0000 ug | ORAL_TABLET | Freq: Every day | ORAL | Status: DC
  Filled 2019-04-22: qty 1

## 2019-04-22 MED ORDER — DOCUSATE SODIUM 100 MG PO CAPS
100.0000 mg | ORAL_CAPSULE | Freq: Two times a day (BID) | ORAL | Status: DC
  Filled 2019-04-22: qty 1

## 2019-04-22 MED ORDER — SODIUM CHLORIDE 0.9% FLUSH
10.0000 mL | INTRAVENOUS | Status: DC | PRN
  Administered 2019-04-23: 10 mL
  Filled 2019-04-22: qty 40

## 2019-04-22 MED ORDER — ZIPRASIDONE MESYLATE 20 MG IM SOLR
10.0000 mg | Freq: Once | INTRAMUSCULAR | Status: AC
  Administered 2019-04-22: 10 mg via INTRAMUSCULAR
  Filled 2019-04-22: qty 20

## 2019-04-22 MED ORDER — GABAPENTIN 400 MG PO CAPS
400.0000 mg | ORAL_CAPSULE | Freq: Three times a day (TID) | ORAL | Status: DC
  Filled 2019-04-22: qty 1

## 2019-04-22 MED ORDER — ONDANSETRON HCL 4 MG PO TABS: 4.0000 mg | ORAL_TABLET | Freq: Four times a day (QID) | ORAL | Status: DC | PRN

## 2019-04-22 MED ORDER — SERTRALINE HCL 100 MG PO TABS
100.0000 mg | ORAL_TABLET | Freq: Every day | ORAL | Status: DC
  Filled 2019-04-22: qty 1

## 2019-04-22 MED ORDER — STERILE WATER FOR INJECTION IJ SOLN
INTRAMUSCULAR | Status: AC
  Administered 2019-04-22: 1.2 mL
  Filled 2019-04-22: qty 10

## 2019-04-22 MED ORDER — CHARCOAL ACTIVATED PO LIQD: 80.0000 g | Freq: Once | ORAL | Status: DC

## 2019-04-22 MED ORDER — STERILE WATER FOR INJECTION IJ SOLN
INTRAMUSCULAR | Status: AC
  Administered 2019-04-22: 13:00:00
  Filled 2019-04-22: qty 10

## 2019-04-22 MED ORDER — ACETAMINOPHEN 325 MG PO TABS: 650.0000 mg | ORAL_TABLET | Freq: Four times a day (QID) | ORAL | Status: DC | PRN

## 2019-04-22 MED ORDER — ZIPRASIDONE MESYLATE 20 MG IM SOLR
20.0000 mg | Freq: Once | INTRAMUSCULAR | Status: AC
  Administered 2019-04-22: 20 mg via INTRAMUSCULAR
  Filled 2019-04-22 (×2): qty 20

## 2019-04-22 MED ORDER — LACTATED RINGERS IV BOLUS: 1000.0000 mL | Freq: Once | INTRAVENOUS | Status: DC

## 2019-04-22 MED ORDER — QUETIAPINE FUMARATE 400 MG PO TABS
400.0000 mg | ORAL_TABLET | Freq: Every day | ORAL | Status: DC
  Filled 2019-04-22: qty 1

## 2019-04-22 MED ORDER — ALBUTEROL SULFATE HFA 108 (90 BASE) MCG/ACT IN AERS
2.0000 | INHALATION_SPRAY | Freq: Four times a day (QID) | RESPIRATORY_TRACT | Status: DC | PRN
  Filled 2019-04-22: qty 6.7

## 2019-04-22 MED ORDER — BISACODYL 5 MG PO TBEC: 5.0000 mg | DELAYED_RELEASE_TABLET | Freq: Every day | ORAL | Status: DC | PRN

## 2019-04-22 MED ORDER — QUETIAPINE FUMARATE 100 MG PO TABS
100.0000 mg | ORAL_TABLET | Freq: Two times a day (BID) | ORAL | Status: DC
  Administered 2019-04-22: 100 mg via ORAL
  Filled 2019-04-22: qty 4
  Filled 2019-04-22 (×2): qty 1

## 2019-04-22 MED ORDER — SODIUM CHLORIDE 0.9 % IV BOLUS
1000.0000 mL | Freq: Once | INTRAVENOUS | Status: AC
  Administered 2019-04-22: 2000 mL via INTRAVENOUS

## 2019-04-22 MED ORDER — LORAZEPAM 2 MG/ML IJ SOLN
1.0000 mg | Freq: Once | INTRAMUSCULAR | Status: AC
  Administered 2019-04-22: 1 mg via INTRAVENOUS
  Filled 2019-04-22: qty 1

## 2019-04-22 MED ORDER — THIAMINE HCL 100 MG/ML IJ SOLN
Freq: Once | INTRAVENOUS | Status: AC
  Administered 2019-04-22: 21:00:00 via INTRAVENOUS
  Filled 2019-04-22: qty 1000

## 2019-04-22 MED ORDER — POTASSIUM CHLORIDE 20 MEQ/15ML (10%) PO SOLN: 40.0000 meq | Freq: Once | ORAL | Status: DC

## 2019-04-22 MED ORDER — SODIUM CHLORIDE 0.9 % IV SOLN
2.0000 g | INTRAVENOUS | Status: DC
  Administered 2019-04-22: 2 g via INTRAVENOUS
  Filled 2019-04-22: qty 20
  Filled 2019-04-22: qty 2

## 2019-04-22 MED ORDER — BENZTROPINE MESYLATE 1 MG PO TABS
0.5000 mg | ORAL_TABLET | Freq: Every day | ORAL | Status: DC
  Filled 2019-04-22: qty 1

## 2019-04-22 MED ORDER — DOXEPIN HCL 25 MG PO CAPS
75.0000 mg | ORAL_CAPSULE | Freq: Every evening | ORAL | Status: DC | PRN
  Filled 2019-04-22: qty 1

## 2019-04-22 MED ORDER — PANTOPRAZOLE SODIUM 40 MG PO TBEC
40.0000 mg | DELAYED_RELEASE_TABLET | Freq: Every day | ORAL | Status: DC
  Filled 2019-04-22: qty 1

## 2019-04-22 MED ORDER — SODIUM BICARBONATE 8.4 % IV SOLN
80.0000 meq | Freq: Once | INTRAVENOUS | Status: AC
  Administered 2019-04-22: 80 meq via INTRAVENOUS
  Filled 2019-04-22: qty 50

## 2019-04-22 MED ORDER — POLYETHYLENE GLYCOL 3350 17 G PO PACK: 17.0000 g | PACK | Freq: Every day | ORAL | Status: DC | PRN

## 2019-04-22 MED ORDER — ENOXAPARIN SODIUM 40 MG/0.4ML ~~LOC~~ SOLN
40.0000 mg | SUBCUTANEOUS | Status: DC
  Administered 2019-04-23: 40 mg via SUBCUTANEOUS
  Filled 2019-04-22: qty 0.4

## 2019-04-22 MED ORDER — ONDANSETRON HCL 4 MG/2ML IJ SOLN
4.0000 mg | Freq: Four times a day (QID) | INTRAMUSCULAR | Status: DC | PRN
  Administered 2019-04-24: 4 mg via INTRAVENOUS
  Filled 2019-04-22: qty 2

## 2019-04-22 MED ORDER — SODIUM CHLORIDE 0.9 % IV SOLN
500.0000 mg | INTRAVENOUS | Status: DC
  Administered 2019-04-22 – 2019-04-25 (×3): 500 mg via INTRAVENOUS
  Filled 2019-04-22 (×4): qty 500

## 2019-04-22 MED ORDER — SODIUM CHLORIDE 0.9% FLUSH
3.0000 mL | Freq: Two times a day (BID) | INTRAVENOUS | Status: DC
  Administered 2019-04-22 – 2019-04-28 (×10): 3 mL via INTRAVENOUS

## 2019-04-22 MED ORDER — LORAZEPAM 2 MG/ML IJ SOLN
2.0000 mg | INTRAMUSCULAR | Status: DC | PRN
  Administered 2019-04-22: 2 mg via INTRAMUSCULAR
  Filled 2019-04-22: qty 1

## 2019-04-22 MED ORDER — SODIUM CHLORIDE 0.9% FLUSH
10.0000 mL | Freq: Two times a day (BID) | INTRAVENOUS | Status: DC
  Administered 2019-04-22 – 2019-04-27 (×8): 10 mL
  Administered 2019-04-27: 20 mL
  Administered 2019-04-28: 10 mL

## 2019-04-22 NOTE — ED Notes (Signed)
Per Eustaquio Maize, RN at poison control, okay to discontinue bicarb drip and salicylate level; Dr. Lorin Mercy notified

## 2019-04-22 NOTE — Progress Notes (Signed)
Patient is very agitated, doesn't want town and keeps getting up from the bed. 2 Nurse Techs holding the patient. RN made aware.

## 2019-04-22 NOTE — Progress Notes (Signed)
This IV Nurse was unable to placed a PIV or midline. Patient became agitated and trying to get up. RN and Nurse tech/Sitter at bedside.

## 2019-04-22 NOTE — ED Notes (Signed)
Patient is sleeping at this time.

## 2019-04-22 NOTE — Progress Notes (Signed)
Responded to consult for PIV with call to RN. Pt has been previously assessed and PIV attempted by 2 VAS Team RN's with documentation of infiltration of last PIV. VAS Team has exhausted resources per protocol for obtaining access. Recommended discussing CVAD with MD for any further access needs due to the above.

## 2019-04-22 NOTE — Consult Note (Addendum)
NEURO HOSPITALIST CONSULT NOTE   Requestig physician: Dr. Lorin Mercy  Reason for Consult: AMS  History obtained from:   Chart    HPI:                                                                                                                                          Mary Dodson is an 53 y.o. female with PMHx of EtOH dependence, PTSD, bipolar disorder, hypothyroidism who presented with AMS on Monday evening. 911 was called by her friend due to her having multiple falls and appearing altered. Her oral temp was 100 per EMS, with BP of 100/53. In the ED, she was confused and not exhibiting understanding when redirected. She became increasingly agitated, getting out of bed and walking around. Apparently she had been confused with falls for the last week, significantly worsening over the last couple of days. Her speech was very tangential on EDP exam. She denied neck pain but endorsed recurrend headaches. She did not have significant abdominal pain. She had crackles on the left on pulmonary exam and X-ray suggestive of PNA. ASA overdose, infection, side effect of recently started high dose of Seroquel and serotonin syndrome given Zoloft rx were considerations. She had full range of neck motion. She had an elevated salycilate level, so ASA toxicity was also a consideration (was taking multiple BC powders for her headache). Of note, her EtOH level in the ED was negative. Acetaminophen level was < 10.  Ativan did not result in improvement of her AMS. CT head was negative. O2 sats did drop to 90%, therefore O2 via [place] was started. Geodon IM was given overnight due to periodic severe agitation. TSH was low at 0.21, with T4 also low at 0.42 Covid testing was negative; however, there is a high suspicion for a false negative test given bilateral PNA with sats that have been trending downwards. CRP was elevated at 16.8, ESR 48. WBC was 9.4.   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 She does not social distance.   Of note, she was seen on 6/20 by Neurology for episodic headache with confusion/delirium with DDx of complicated migraines versus atypical seizure at that time - the latter may be less likely given a normal awake and asleep EEG on 6/17.    Past Medical History:  Diagnosis Date  . Alcohol abuse   . Bipolar disease, chronic ([place])   . Hypothyroidism (acquired)   . PTSD (post-traumatic stress disorder)     History reviewed. No pertinent surgical history.  Family History  Problem Relation Age of Onset  . Cancer Mother        suicide  . Breast cancer Maternal Grandmother   . Cancer Maternal Grandmother   . Heart  attack Father               Social History:  reports that she has been smoking cigarettes. She has been smoking about 1.00 pack per day. She has never used smokeless tobacco. She reports previous alcohol use. She reports that she does not use drugs.  Allergies  Allergen Reactions  . Morphine And Related Other (See Comments)    "Makes me crazy"  . Morphine And Related Other (See Comments)    "It is bad"  . Morphine And Related   . Trazodone And Nefazodone Other (See Comments)    HOME MEDICATIONS:                                                                                                                      No current facility-administered medications on file prior to encounter.    Current Outpatient Medications on File Prior to Encounter  Medication Sig Dispense Refill  . albuterol (VENTOLIN HFA) 108 (90 Base) MCG/ACT inhaler Inhale 2 puffs into the lungs every 6 (six) hours as needed for wheezing or shortness of breath.    . Amino Acids (AMINO ACID PO) Take 1 tablet by mouth daily.    . Aspirin-Salicylamide-Caffeine (BC HEADACHE PO) Take 6-7 packets by mouth daily as needed (headache).    . benztropine (COGENTIN) 0.5 MG tablet Take 0.5 mg by mouth at bedtime.  0  . calcium carbonate (OSCAL) 1500 (600  Ca) MG TABS tablet Take 600 mg of elemental calcium by mouth daily with breakfast.    . doxepin (SINEQUAN) 75 MG capsule Take 75 mg by mouth at bedtime as needed (sleep).     . gabapentin (NEURONTIN) 400 MG capsule Take 2 capsules (800 mg total) by mouth 4 (four) times daily -  with meals and at bedtime. (Patient taking differently: Take 400 mg by mouth 4 (four) times daily -  with meals and at bedtime. ) 30 capsule 0  . ibuprofen (ADVIL) 800 MG tablet Take 800 mg by mouth 3 (three) times daily as needed for fever or headache (pain).     . Multiple Vitamins-Minerals (MULTIVITAMIN WITH MINERALS) tablet Take 1 tablet by mouth daily.    . Omega-3 Fatty Acids (FISH OIL OMEGA-3 PO) Take 1 tablet by mouth daily.    Marland Kitchen omeprazole (PRILOSEC) 40 MG capsule Take 40 mg by mouth daily.  0  . promethazine (PHENERGAN) 25 MG tablet Take 25 mg by mouth daily as needed for nausea or vomiting.   0  . QUEtiapine (SEROQUEL) 100 MG tablet Take 1 tablet (100 mg total) by mouth 2 (two) times daily. (Patient taking differently: Take 100 mg by mouth 2 (two) times daily with breakfast and lunch. Also take 400 mg at bedtime) 60 tablet 0  . QUEtiapine (SEROQUEL) 400 MG tablet Take 400 mg by mouth at bedtime. Also take 100 mg with breakfast and lunch    . sertraline (ZOLOFT) 100 MG tablet Take 100 mg by mouth daily after breakfast.    .  SYNTHROID 50 MCG tablet Take 1 tablet (50 mcg total) by mouth daily. 30 tablet 3     ROS:                                                                                                                                       Unable to obtain due to delirium.    Blood pressure (!) 109/55, pulse 89, temperature 98.4 F (36.9 C), temperature source Oral, resp. rate 19, height _0  (1.626 m), weight 86.6 kg, SpO2 96 %, unknown if currently breastfeeding.   General Examination:                                                                                                       Physical Exam   HEENT-  Normocephalic. Bruising under right eye. No nuchal rigidity.  Lungs- No increased work of breathing, but has occasional dry cough.  Extremities- Warm and well perfused. Bruising to knees noted.   Neurological Examination Mental Status: Asleep when examiner enters room. Difficult to arouse. She arouses to an agitated state with eyes closed and frequent fluent exclamations. Will yell for examiner to stop during plantar stimulation; semipurposeful thrashing and withdrawal also noted. At one point, with eyes still closed, she attempts to get out of bed. Does not follow any commands.  Cranial Nerves: II: Keeps eyes tightly closed during attempts to assess pupillary light reflex. III,IV, VI: Unable to assess V,VII: Face is symmetric. Unable to formally test facial sensation.  VIII: Does not respond to questions.  IX,X: Unable to assess XI: Head is midline.  XII: Does not protrude tongue to command Motor/Sensory: Thrashes all 4 extremities equally with 5/5 strength during plantar stimulation.  Deep Tendon Reflexes: Limited exam due to agitation: 1+ brachioradialis bilaterally. Yells "ow" when attempting to obtain patellar reflexes.  Plantars: Right: downgoing   Left: downgoing Cerebellar/Gait: Unable to assess    Lab Results: Basic Metabolic Panel: Recent Labs  Lab 04/21/19 2119 04/22/19 0011 04/22/19 0104 04/22/19 0744  NA 141 142 140 144  K 3.4* 3.7 3.2* 3.5  CL 108  --  111 114*  CO2 19*  --  17* 20*  GLUCOSE 109*  --  105* 106*  BUN 15  --  14 12  CREATININE 0.93  --  0.79 0.66  CALCIUM 8.2*  --  7.5* 7.4*  PHOS  --   --   --  2.2*    CBC: Recent Labs  Lab 04/21/19 2119 04/22/19  0011  WBC 9.4  --   NEUTROABS 7.1  --   HGB 10.9* 11.2*  HCT 33.4* 33.0*  MCV 89.8  --   PLT 264  --     Cardiac Enzymes: No results for input(s): CKTOTAL, CKMB, CKMBINDEX, TROPONINI in the last 168 hours.  Lipid Panel: No results for input(s): CHOL, TRIG, HDL, CHOLHDL, VLDL,  LDLCALC in the last 168 hours.  Imaging: Ct Head Wo Contrast  Result Date: 04/22/2019 CLINICAL DATA:  Altered mental status.  History of alcohol abuse EXAM: CT HEAD WITHOUT CONTRAST CT CERVICAL SPINE WITHOUT CONTRAST TECHNIQUE: Multidetector CT imaging of the head and cervical spine was performed following the standard protocol without intravenous contrast. Multiplanar CT image reconstructions of the cervical spine were also generated. COMPARISON:  10/01/2018 FINDINGS: CT HEAD FINDINGS Brain: No evidence of acute infarction, hemorrhage, hydrocephalus, extra-axial collection or mass lesion/mass effect. Vascular: No hyperdense vessel or unexpected calcification. Skull: Normal. Negative for fracture or focal lesion. Sinuses/Orbits: No acute finding. CT CERVICAL SPINE FINDINGS Alignment: When allowing for positioning there is no malalignment. Skull base and vertebrae: Negative for fracture or bone lesion Soft tissues and spinal canal: No prevertebral fluid or swelling. No visible canal hematoma. Postoperative lateral left neck. Disc levels:  C6-7 disc degeneration. Upper chest: Minimal ground-glass opacity in the right apical lung. Chest x-ray was performed a few hours earlier and reported similar findings. IMPRESSION: No acute intracranial or cervical spine finding. Electronically Signed   By: Monte Fantasia M.D.   On: 04/22/2019 05:24   Ct Cervical Spine Wo Contrast  Result Date: 04/22/2019 CLINICAL DATA:  Altered mental status.  History of alcohol abuse EXAM: CT HEAD WITHOUT CONTRAST CT CERVICAL SPINE WITHOUT CONTRAST TECHNIQUE: Multidetector CT imaging of the head and cervical spine was performed following the standard protocol without intravenous contrast. Multiplanar CT image reconstructions of the cervical spine were also generated. COMPARISON:  10/01/2018 FINDINGS: CT HEAD FINDINGS Brain: No evidence of acute infarction, hemorrhage, hydrocephalus, extra-axial collection or mass lesion/mass effect.  Vascular: No hyperdense vessel or unexpected calcification. Skull: Normal. Negative for fracture or focal lesion. Sinuses/Orbits: No acute finding. CT CERVICAL SPINE FINDINGS Alignment: When allowing for positioning there is no malalignment. Skull base and vertebrae: Negative for fracture or bone lesion Soft tissues and spinal canal: No prevertebral fluid or swelling. No visible canal hematoma. Postoperative lateral left neck. Disc levels:  C6-7 disc degeneration. Upper chest: Minimal ground-glass opacity in the right apical lung. Chest x-ray was performed a few hours earlier and reported similar findings. IMPRESSION: No acute intracranial or cervical spine finding. Electronically Signed   By: Monte Fantasia M.D.   On: 04/22/2019 05:24   Dg Chest Portable 1 View  Result Date: 04/21/2019 CLINICAL DATA:  53 year old female with multiple falls, fever, altered mental status. EXAM: PORTABLE CHEST 1 VIEW COMPARISON:  Portable chest 10/01/2016 and earlier. FINDINGS: Portable AP semi upright view at 2145 hours. Lower lung volumes with patchy and indistinct bilateral multifocal pulmonary opacity. Mildly lower lung volumes. Mediastinal contours remain normal. Visualized tracheal air column is within normal limits. No pneumothorax or pleural effusion. Negative visible bowel gas pattern. No acute osseous abnormality identified. IMPRESSION: Patchy and indistinct bilateral pulmonary opacity suspicious for bilateral pneumonia. Consider viral/atypical etiology. No pleural effusion. Electronically Signed   By: Genevie Ann M.D.   On: 04/21/2019 21:58    Assessment: 53 year old female with a one-week history of gait unsteadiness and worsening confusion. In isolation for possible false negative coronavirus infection.  1. Current exam findings most consistent with an agitated delirium 2. DDx includes EtOH withdrawal, PNA with hypoxic and/or infectious encephalopathy, new onset subclinical seizures with postictal state and  toxic/metabolic encephalopathy. Also on DDx would be a rare potential manifestation of coronavirus infection with encephalopathy.  3. She has both a low TSH and low free T4, which should be further investigated. She is on Synthroid.  4. UTOX was negative.  5. ESR and CRP are elevated, compatible with infection or inflammatory process 6. Serotonin syndrome also on the DDx. She is on Zoloft.  7. Acute thiamine deficiency with Wernicke's encephalopathy should also be considered given confusion with falls. Unable to examine her eyes for possible 3rd component of the triad (oculomotor abnormalities) at time of Neurology assessment due to agitation.   Recommendations: 1. CTA head  2. MRI brain with and without contrast 3. EEG 4. The above studies will likely require additional sedation 5. CIWA protocol. May benefit from scheduled benzodiazepine rather than PRN. EtOH withdrawal refractory to relatively low doses of PRN Ativan is relatively high on the DDx. Will defer to primary team regarding decision to initiate scheduled IV benzodiazepine.  6. STAT thiamine level, then start IV high dose thiamine at 500 mg TID x 3 days (ordered), then 250 mg TID x 3 days (primary team to order), followed by 100 mg po qd thereafter.   Electronically signed: Dr. Kerney Elbe 04/22/2019, 7:18 PM

## 2019-04-22 NOTE — ED Notes (Signed)
ED TO INPATIENT HANDOFF REPORT   ED Nurse Name and Phone #: Henderson [place] [phone]  S Name/Age/Gender [name] 53 y.o. female Room/Bed: 028C/028C  Code Status   Code Status: Full Code  Home/SNF/Other Home   Triage Complete: Triage complete  Chief Complaint AMS  Triage Note EMS reports that 911 was called by her friend. Per ems the pt has had multiple falls, pt appears altered, oriented to self. No medication was given per ems. 100*f fever orally, with pressure 100/53.   Allergies Allergies  Allergen Reactions  . Morphine And Related Other (See Comments)    "Makes me crazy"  . Morphine And Related Other (See Comments)    "It is bad"  . Morphine And Related   . Trazodone And Nefazodone Other (See Comments)    Level of Care/Admitting Diagnosis ED Disposition    ED Disposition Condition Comment   Admit  Hospital Area: [facility] [[id number]]  Level of Care: Progressive [102]  I expect the patient will be discharged within 24 hours: No (not a candidate for 5C-Observation unit)  Covid Evaluation: Person Under Investigation (PUI)  Diagnosis: Acute encephalopathy [119147]  Admitting Physician: Briscoe Deutscher [[id number]]  Attending Physician: Briscoe Deutscher [[id number]]  PT Class (Do Not Modify): Observation [104]  PT Acc Code (Do Not Modify): Observation [10022]       B Medical/Surgery History Past Medical History:  Diagnosis Date  . Alcohol abuse   . Bipolar disease, chronic (HCC)   . Hypothyroidism (acquired)   . PTSD (post-traumatic stress disorder)    History reviewed. No pertinent surgical history.   A IV Location/Drains/Wounds Patient Lines/Drains/Airways Status   Active Line/Drains/Airways    None          Intake/Output Last 24 hours No intake or output data in the 24 hours ending 04/22/19 1047  Labs/Imaging Results for orders placed or performed during the hospital encounter of 04/21/19 (from the past 48 hour(s))  CBC with  Differential     Status: Abnormal   Collection Time: 04/21/19  9:19 PM  Result Value Ref Range   WBC 9.4 4.0 - 10.5 K/uL   RBC 3.72 (L) 3.87 - 5.11 MIL/uL   Hemoglobin 10.9 (L) 12.0 - 15.0 g/dL   HCT 46.9 (L) 62.9 - 52.8 %   MCV 89.8 80.0 - 100.0 fL   MCH 29.3 26.0 - 34.0 pg   MCHC 32.6 30.0 - 36.0 g/dL   RDW 41.3 (H) 24.4 - 01.0 %   Platelets 264 150 - 400 K/uL   nRBC 0.0 0.0 - 0.2 %   Neutrophils Relative % 75 %   Neutro Abs 7.1 1.7 - 7.7 K/uL   Lymphocytes Relative 15 %   Lymphs Abs 1.4 0.7 - 4.0 K/uL   Monocytes Relative 7 %   Monocytes Absolute 0.7 0.1 - 1.0 K/uL   Eosinophils Relative 1 %   Eosinophils Absolute 0.1 0.0 - 0.5 K/uL   Basophils Relative 1 %   Basophils Absolute 0.1 0.0 - 0.1 K/uL   Immature Granulocytes 1 %   Abs Immature Granulocytes 0.06 0.00 - 0.07 K/uL    Comment: Performed at [facility], [address] [place], [place] [address]  Comprehensive metabolic panel     Status: Abnormal   Collection Time: 04/21/19  9:19 PM  Result Value Ref Range   Sodium 141 135 - 145 mmol/L   Potassium 3.4 (L) 3.5 - 5.1 mmol/L   Chloride 108 98 - 111 mmol/L  CO2 19 (L) 22 - 32 mmol/L   Glucose, Bld 109 (H) 70 - 99 mg/dL   BUN 15 6 - 20 mg/dL   Creatinine, Ser 1.61 0.44 - 1.00 mg/dL   Calcium 8.2 (L) 8.9 - 10.3 mg/dL   Total Protein 6.3 (L) 6.5 - 8.1 g/dL   Albumin 3.4 (L) 3.5 - 5.0 g/dL   AST 096 (H) 15 - 41 U/L   ALT 22 0 - 44 U/L   Alkaline Phosphatase 73 38 - 126 U/L   Total Bilirubin 0.6 0.3 - 1.2 mg/dL   GFR calc non Af Amer >60 >60 mL/min   GFR calc Af Amer >60 >60 mL/min   Anion gap 14 5 - 15    Comment: Performed at [facility], [address] [place], [place] [address]  TSH     Status: Abnormal   Collection Time: 04/21/19  9:19 PM  Result Value Ref Range   TSH 0.210 (L) 0.350 - 4.500 uIU/mL    Comment: Performed by a 3rd Generation assay with a functional sensitivity of <=0.01 uIU/mL. Performed at [facility], [address] [place], [place] [address]   Ethanol     Status: None   Collection Time: 04/21/19  9:20 PM  Result Value Ref Range   Alcohol, Ethyl (B) <10 <10 mg/dL    Comment: (NOTE) Lowest detectable limit for serum alcohol is 10 mg/dL. For medical purposes only. Performed at [facility], [address], [place], [place] [address]   Acetaminophen level     Status: Abnormal   Collection Time: 04/21/19  9:20 PM  Result Value Ref Range   Acetaminophen (Tylenol), Serum <10 (L) 10 - 30 ug/mL    Comment: (NOTE) Therapeutic concentrations vary significantly. A range of 10-30 ug/mL  may be an effective concentration for many patients. However, some  are best treated at concentrations outside of this range. Acetaminophen concentrations >150 ug/mL at 4 hours after ingestion  and >50 ug/mL at 12 hours after ingestion are often associated with  toxic reactions. Performed at [facility], [address], [place], [place] [address]   Salicylate level     Status: Abnormal   Collection Time: 04/21/19  9:20 PM  Result Value Ref Range   Salicylate Lvl 41.1 (HH) 2.8 - 30.0 mg/dL    Comment: CRITICAL RESULT CALLED TO, READ BACK BY AND VERIFIED WITH: Lita Mains 2232 04/21/2019 WBOND Performed at [facility], [address] [place], [place] [address]   Ammonia     Status: None   Collection Time: 04/21/19  9:21 PM  Result Value Ref Range   Ammonia 26 9 - 35 umol/L    Comment: Performed at [facility], [address] [place], [place] [address]  Lactic acid, plasma     Status: None   Collection Time: 04/21/19  9:45 PM  Result Value Ref Range   Lactic Acid, Venous 0.8 0.5 - 1.9 mmol/L    Comment: Performed at [facility], [address] [place], [place] [address]  I-Stat beta hCG blood, ED     Status: Abnormal   Collection Time: 04/21/19  9:54 PM  Result Value Ref Range   I-stat hCG, quantitative 9.4 (H) <5 mIU/mL   Comment 3            Comment:   GEST. AGE      CONC.   (mIU/mL)   <=1 WEEK  5 - 50     2 WEEKS       50 - 500     3 WEEKS       100 - 10,000     4 WEEKS     1,000 - 30,000        FEMALE AND NON-PREGNANT FEMALE:     LESS THAN 5 mIU/mL   CBG monitoring, ED     Status: Abnormal   Collection Time: 04/21/19 10:02 PM  Result Value Ref Range   Glucose-Capillary 103 (H) 70 - 99 mg/dL  SARS Coronavirus 2 (CEPHEID- Performed in [facility] hospital lab), Hosp Order     Status: None   Collection Time: 04/21/19 10:55 PM   Specimen: Nasopharyngeal Swab  Result Value Ref Range   SARS Coronavirus 2 NEGATIVE NEGATIVE    Comment: (NOTE) If result is NEGATIVE SARS-CoV-2 target nucleic acids are NOT DETECTED. The SARS-CoV-2 RNA is generally detectable in upper and lower  respiratory specimens during the acute phase of infection. The lowest  concentration of SARS-CoV-2 viral copies this assay can detect is 250  copies / mL. A negative result does not preclude SARS-CoV-2 infection  and should not be used as the sole basis for treatment or other  patient management decisions.  A negative result may occur with  improper specimen collection / handling, submission of specimen other  than nasopharyngeal swab, presence of viral mutation(s) within the  areas targeted by this assay, and inadequate number of viral copies  (<250 copies / mL). A negative result must be combined with clinical  observations, patient history, and epidemiological information. If result is POSITIVE SARS-CoV-2 target nucleic acids are DETECTED. The SARS-CoV-2 RNA is generally detectable in upper and lower  respiratory specimens dur ing the acute phase of infection.  Positive  results are indicative of active infection with SARS-CoV-2.  Clinical  correlation with patient history and other diagnostic information is  necessary to determine patient infection status.  Positive results do  not rule out bacterial infection or co-infection with other viruses. If result is PRESUMPTIVE  POSTIVE SARS-CoV-2 nucleic acids MAY BE PRESENT.   A presumptive positive result was obtained on the submitted specimen  and confirmed on repeat testing.  While 2019 novel coronavirus  (SARS-CoV-2) nucleic acids may be present in the submitted sample  additional confirmatory testing may be necessary for epidemiological  and / or clinical management purposes  to differentiate between  SARS-CoV-2 and other Sarbecovirus currently known to infect humans.  If clinically indicated additional testing with an alternate test  methodology [phone] is advised. The SARS-CoV-2 RNA is generally  detectable in upper and lower respiratory sp ecimens during the acute  phase of infection. The expected result is Negative. Fact Sheet for Patients:  BoilerBrush.com.cyhttps://www.fda.gov/media/136312/download Fact Sheet for Healthcare Providers: https://pope.com/https://www.fda.gov/media/136313/download This test is not yet approved or cleared by the [place] FDA and has been authorized for detection and/or diagnosis of SARS-CoV-2 by FDA under an Emergency Use Authorization (EUA).  This EUA will remain in effect (meaning this test can be used) for the duration of the COVID-19 declaration under Section 564(b)(1) of the Act, 21 U.S.C. section 360bbb-3(b)(1), unless the authorization is terminated or revoked sooner. Performed at [facility], [address] [place], [place] [address]   I-STAT 7, (LYTES, BLD GAS, ICA, H+H)     Status: Abnormal   Collection Time: 04/22/19 12:11 AM  Result Value Ref Range   pH, Arterial 7.358 7.350 - 7.450  pCO2 arterial 33.2 32.0 - 48.0 mmHg   pO2, Arterial 66.0 (L) 83.0 - 108.0 mmHg   Bicarbonate 18.7 (L) 20.0 - 28.0 mmol/L   TCO2 20 (L) 22 - 32 mmol/L   O2 Saturation 92.0 %   Acid-base deficit 6.0 (H) 0.0 - 2.0 mmol/L   Sodium 142 135 - 145 mmol/L   Potassium 3.7 3.5 - 5.1 mmol/L   Calcium, Ion 1.05 (L) 1.15 - 1.40 mmol/L   HCT 33.0 (L) 36.0 - 46.0 %   Hemoglobin 11.2 (L) 12.0 - 15.0  g/dL   Patient temperature HIDE    Collection site RADIAL, ALLEN'S TEST ACCEPTABLE    Drawn by RT    Sample type ARTERIAL   Lactic acid, plasma     Status: None   Collection Time: 04/22/19  1:04 AM  Result Value Ref Range   Lactic Acid, Venous 0.7 0.5 - 1.9 mmol/L    Comment: Performed at [facility], [address], [place], [place] [address]  APTT     Status: Abnormal   Collection Time: 04/22/19  1:04 AM  Result Value Ref Range   aPTT 40 (H) 24 - 36 seconds    Comment:        IF BASELINE aPTT IS ELEVATED, SUGGEST PATIENT RISK ASSESSMENT BE USED TO DETERMINE APPROPRIATE ANTICOAGULANT THERAPY. Performed at [facility], [place] [address] [place], [place] [address]   Protime-INR     Status: Abnormal   Collection Time: 04/22/19  1:04 AM  Result Value Ref Range   Prothrombin Time 18.7 (H) 11.4 - 15.2 seconds   INR 1.6 (H) 0.8 - 1.2    Comment: (NOTE) INR goal varies based on device and disease states. Performed at [facility], [place] [address], [place], [place] [address]   Salicylate level     Status: Abnormal   Collection Time: 04/22/19  1:04 AM  Result Value Ref Range   Salicylate Lvl 97.3 (HH) 2.8 - 30.0 mg/dL    Comment: CRITICAL RESULT CALLED TO, READ BACK BY AND VERIFIED WITH: PHIL JOHNSTON RN 04/22/2019 AT 0214 BY H SOEWARDIMAN MT Performed at [facility], [place] [address], [place], [place] [address]   Basic metabolic panel     Status: Abnormal   Collection Time: 04/22/19  1:04 AM  Result Value Ref Range   Sodium 140 135 - 145 mmol/L   Potassium 3.2 (L) 3.5 - 5.1 mmol/L   Chloride 111 98 - 111 mmol/L   CO2 17 (L) 22 - 32 mmol/L   Glucose, Bld 105 (H) 70 - 99 mg/dL   BUN 14 6 - 20 mg/dL   Creatinine, Ser 0.79 0.44 - 1.00 mg/dL   Calcium 7.5 (L) 8.9 - 10.3 mg/dL   GFR calc non Af Amer >60 >60 mL/min   GFR calc Af Amer >60 >60 mL/min   Anion gap 12 5 - 15    Comment: Performed at [place] [address] [place], [place] [address]   Sedimentation rate     Status: Abnormal   Collection Time: 04/22/19  1:04 AM  Result Value Ref Range   Sed Rate 48 (H) 0 - 22 mm/hr    Comment: Performed at [place] [address] [place], [place] [address]  C-reactive protein     Status: Abnormal   Collection Time: 04/22/19  1:04 AM  Result Value Ref Range   CRP 16.8 (H) <1.0 mg/dL  Comment: Performed at [facility], [address] [place], [place] [address]  Ferritin (Iron Binding Protein)     Status: None   Collection Time: 04/22/19  1:04 AM  Result Value Ref Range   Ferritin 19 11 - 307 ng/mL    Comment: Performed at [facility], [address] [place], [place] [address]  D-dimer, quantitative (not at [facility])     Status: Abnormal   Collection Time: 04/22/19  1:04 AM  Result Value Ref Range   D-Dimer, Quant 1.26 (H) 0.00 - 0.50 ug/mL-FEU    Comment: (NOTE) At the manufacturer cut-off of 0.50 ug/mL FEU, this assay has been documented to exclude PE with a sensitivity and negative predictive value of 97 to 99%.  At this time, this assay has not been approved by the FDA to exclude DVT/VTE. Results should be correlated with clinical presentation. Performed at [facility], [address] [place], [place] [address]   Procalcitonin     Status: None   Collection Time: 04/22/19  1:04 AM  Result Value Ref Range   Procalcitonin <0.10 ng/mL    Comment:        Interpretation: PCT (Procalcitonin) <= 0.5 ng/mL: Systemic infection (sepsis) is not likely. Local bacterial infection is possible. (NOTE)       Sepsis PCT Algorithm           Lower Respiratory Tract                                      Infection PCT Algorithm    ----------------------------     ----------------------------         PCT < 0.25 ng/mL                PCT < 0.10 ng/mL         Strongly encourage             Strongly discourage   discontinuation of antibiotics    initiation of antibiotics    ----------------------------      -----------------------------       PCT 0.25 - 0.50 ng/mL            PCT 0.10 - 0.25 ng/mL               OR       >80% decrease in PCT            Discourage initiation of                                            antibiotics      Encourage discontinuation           of antibiotics    ----------------------------     -----------------------------         PCT >= 0.50 ng/mL              PCT 0.26 - 0.50 ng/mL               AND        <80% decrease in PCT             Encourage initiation of  antibiotics       Encourage continuation           of antibiotics    ----------------------------     -----------------------------        PCT >= 0.50 ng/mL                  PCT > 0.50 ng/mL               AND         increase in PCT                  Strongly encourage                                      initiation of antibiotics    Strongly encourage escalation           of antibiotics                                     -----------------------------                                           PCT <= 0.25 ng/mL                                                 OR                                        > 80% decrease in PCT                                     Discontinue / Do not initiate                                             antibiotics Performed at [facility], [address] [place], [place] [address]   T4, free     Status: Abnormal   Collection Time: 04/22/19  1:04 AM  Result Value Ref Range   Free T4 0.42 (L) 0.61 - 1.12 ng/dL    Comment: (NOTE) Biotin ingestion may interfere with free T4 tests. If the results are inconsistent with the TSH level, previous test results, or the clinical presentation, then consider biotin interference. If needed, order repeat testing after stopping biotin. Performed at [facility], [address], [place], [place] [address]   SARS Coronavirus 2 (CEPHEID- Performed in [facility] hospital lab),  Hosp Order     Status: None   Collection Time: 04/22/19  7:38 AM   Specimen: Nasopharyngeal Swab  Result Value Ref Range   SARS Coronavirus 2 NEGATIVE NEGATIVE    Comment: (NOTE) If result is NEGATIVE SARS-CoV-2 target nucleic acids are NOT DETECTED. The SARS-CoV-2 RNA is generally detectable in upper and lower  respiratory specimens during the acute phase of infection. The lowest  concentration of SARS-CoV-2 viral copies this assay can detect is 250  copies / mL. A negative result does not preclude SARS-CoV-2 infection  and should not be used as the sole basis for treatment or other  patient management decisions.  A negative result may occur with  improper specimen collection / handling, submission of specimen other  than nasopharyngeal swab, presence of viral mutation(s) within the  areas targeted by this assay, and inadequate number of viral copies  (<250 copies / mL). A negative result must be combined with clinical  observations, patient history, and epidemiological information. If result is POSITIVE SARS-CoV-2 target nucleic acids are DETECTED. The SARS-CoV-2 RNA is generally detectable in upper and lower  respiratory specimens dur ing the acute phase of infection.  Positive  results are indicative of active infection with SARS-CoV-2.  Clinical  correlation with patient history and other diagnostic information is  necessary to determine patient infection status.  Positive results do  not rule out bacterial infection or co-infection with other viruses. If result is PRESUMPTIVE POSTIVE SARS-CoV-2 nucleic acids MAY BE PRESENT.   A presumptive positive result was obtained on the submitted specimen  and confirmed on repeat testing.  While 2019 novel coronavirus  (SARS-CoV-2) nucleic acids may be present in the submitted sample  additional confirmatory testing may be necessary for epidemiological  and / or clinical management purposes  to differentiate between  SARS-CoV-2 and other  Sarbecovirus currently known to infect humans.  If clinically indicated additional testing with an alternate test  methodology [phone] is advised. The SARS-CoV-2 RNA is generally  detectable in upper and lower respiratory sp ecimens during the acute  phase of infection. The expected result is Negative. Fact Sheet for Patients:  BoilerBrush.com.cyhttps://www.fda.gov/media/136312/download Fact Sheet for Healthcare Providers: https://pope.com/https://www.fda.gov/media/136313/download This test is not yet approved or cleared by the [place] FDA and has been authorized for detection and/or diagnosis of SARS-CoV-2 by FDA under an Emergency Use Authorization (EUA).  This EUA will remain in effect (meaning this test can be used) for the duration of the COVID-19 declaration under Section 564(b)(1) of the Act, 21 U.S.C. section 360bbb-3(b)(1), unless the authorization is terminated or revoked sooner. Performed at [facility], [address] [place], [place] [address]   Renal function panel     Status: Abnormal   Collection Time: 04/22/19  7:44 AM  Result Value Ref Range   Sodium 144 135 - 145 mmol/L   Potassium 3.5 3.5 - 5.1 mmol/L   Chloride 114 (H) 98 - 111 mmol/L   CO2 20 (L) 22 - 32 mmol/L   Glucose, Bld 106 (H) 70 - 99 mg/dL   BUN 12 6 - 20 mg/dL   Creatinine, Ser 5.620.66 0.44 - 1.00 mg/dL   Calcium 7.4 (L) 8.9 - 10.3 mg/dL   Phosphorus 2.2 (L) 2.5 - 4.6 mg/dL   Albumin 2.7 (L) 3.5 - 5.0 g/dL   GFR calc non Af Amer >60 >60 mL/min   GFR calc Af Amer >60 >60 mL/min   Anion gap 10 5 - 15    Comment: Performed at [facility], [address] [place], [place] [address]  Salicylate level     Status: None   Collection Time: 04/22/19  7:44 AM  Result Value Ref Range   Salicylate Lvl 22.9 2.8 - 30.0 mg/dL    Comment: Performed at [facility], [address] [place], [place]  [address]   Ct Head Wo Contrast  Result Date: 04/22/2019 CLINICAL DATA:  Altered mental status.  History of alcohol abuse  EXAM: CT HEAD WITHOUT CONTRAST CT CERVICAL SPINE WITHOUT CONTRAST TECHNIQUE: Multidetector CT imaging of the head and cervical spine was performed following the standard protocol without intravenous contrast. Multiplanar CT image reconstructions of the cervical spine were also generated. COMPARISON:  10/01/2018 FINDINGS: CT HEAD FINDINGS Brain: No evidence of acute infarction, hemorrhage, hydrocephalus, extra-axial collection or mass lesion/mass effect. Vascular: No hyperdense vessel or unexpected calcification. Skull: Normal. Negative for fracture or focal lesion. Sinuses/Orbits: No acute finding. CT CERVICAL SPINE FINDINGS Alignment: When allowing for positioning there is no malalignment. Skull base and vertebrae: Negative for fracture or bone lesion Soft tissues and spinal canal: No prevertebral fluid or swelling. No visible canal hematoma. Postoperative lateral left neck. Disc levels:  C6-7 disc degeneration. Upper chest: Minimal ground-glass opacity in the right apical lung. Chest x-ray was performed a few hours earlier and reported similar findings. IMPRESSION: No acute intracranial or cervical spine finding. Electronically Signed   By: Marnee Spring M.D.   On: 04/22/2019 05:24   Ct Cervical Spine Wo Contrast  Result Date: 04/22/2019 CLINICAL DATA:  Altered mental status.  History of alcohol abuse EXAM: CT HEAD WITHOUT CONTRAST CT CERVICAL SPINE WITHOUT CONTRAST TECHNIQUE: Multidetector CT imaging of the head and cervical spine was performed following the standard protocol without intravenous contrast. Multiplanar CT image reconstructions of the cervical spine were also generated. COMPARISON:  10/01/2018 FINDINGS: CT HEAD FINDINGS Brain: No evidence of acute infarction, hemorrhage, hydrocephalus, extra-axial collection or mass lesion/mass effect. Vascular: No hyperdense vessel or unexpected calcification. Skull: Normal. Negative for fracture or focal lesion. Sinuses/Orbits: No acute finding. CT CERVICAL  SPINE FINDINGS Alignment: When allowing for positioning there is no malalignment. Skull base and vertebrae: Negative for fracture or bone lesion Soft tissues and spinal canal: No prevertebral fluid or swelling. No visible canal hematoma. Postoperative lateral left neck. Disc levels:  C6-7 disc degeneration. Upper chest: Minimal ground-glass opacity in the right apical lung. Chest x-ray was performed a few hours earlier and reported similar findings. IMPRESSION: No acute intracranial or cervical spine finding. Electronically Signed   By: Marnee Spring M.D.   On: 04/22/2019 05:24   Dg Chest Portable 1 View  Result Date: 04/21/2019 CLINICAL DATA:  53 year old female with multiple falls, fever, altered mental status. EXAM: PORTABLE CHEST 1 VIEW COMPARISON:  Portable chest 10/01/2016 and earlier. FINDINGS: Portable AP semi upright view at 2145 hours. Lower lung volumes with patchy and indistinct bilateral multifocal pulmonary opacity. Mildly lower lung volumes. Mediastinal contours remain normal. Visualized tracheal air column is within normal limits. No pneumothorax or pleural effusion. Negative visible bowel gas pattern. No acute osseous abnormality identified. IMPRESSION: Patchy and indistinct bilateral pulmonary opacity suspicious for bilateral pneumonia. Consider viral/atypical etiology. No pleural effusion. Electronically Signed   By: Odessa Fleming M.D.   On: 04/21/2019 21:58    Pending Labs Unresulted Labs (From admission, onward)    Start     Ordered   04/23/19 0500  CBC with Differential/Platelet  Daily,   R     04/22/19 0912   04/23/19 0500  Comprehensive metabolic panel  Daily,   R     04/22/19 0912   04/23/19 0500  C-reactive protein  Daily,   R     04/22/19 0912   04/23/19 0500  D-dimer, quantitative (not at [facility])  Daily,   R     04/22/19  0912   04/23/19 0500  Ferritin  Daily,   R     04/22/19 0912   04/23/19 0500  Magnesium  Daily,   R     04/22/19 0912   04/23/19 0500  Phosphorus  Daily,    R     04/22/19 0912   04/22/19 0911  HIV antibody (Routine Testing)  Once,   STAT     04/22/19 0912   04/22/19 0400  Urinalysis, Routine w reflex microscopic  Now then every 4 hours,   R (with STAT occurrences)     04/22/19 0049   04/22/19 0126  Ethylene glycol  Once,   STAT     04/22/19 0126   04/21/19 2238  Urine culture  ONCE - STAT,   STAT     04/21/19 2241   04/21/19 2121  Culture, blood (routine x 2)  BLOOD CULTURE X 2,   STAT     04/21/19 2120   04/21/19 2120  Urine rapid drug screen (hosp performed)  ONCE - STAT,   STAT     04/21/19 2120          Vitals/Pain Today's Vitals   04/22/19 0824 04/22/19 0850 04/22/19 0930 04/22/19 0945  BP: 115/60 119/61 110/63 116/72  Pulse: 91 85 86 81  Resp: Temp:      TempSrc:      SpO2: 93% 97% 95% 96%  PainSc:        Isolation Precautions Airborne and Contact precautions  Medications Medications  dextrose 5 % 900 mL with potassium chloride 40 mEq, sodium bicarbonate 100 mEq infusion ( Intravenous Stopped 04/22/19 0944)  sterile water (preservative free) injection (has no administration in time range)  potassium chloride 20 MEQ/15ML (10%) solution 40 mEq (40 mEq Oral Not Given 04/22/19 0750)  doxepin (SINEQUAN) capsule 75 mg (has no administration in time range)  QUEtiapine (SEROQUEL) tablet 100 mg (has no administration in time range)  QUEtiapine (SEROQUEL) tablet 400 mg (has no administration in time range)  sertraline (ZOLOFT) tablet 100 mg (has no administration in time range)  levothyroxine (SYNTHROID) tablet 50 mcg (has no administration in time range)  pantoprazole (PROTONIX) EC tablet 40 mg (has no administration in time range)  benztropine (COGENTIN) tablet 0.5 mg (has no administration in time range)  gabapentin (NEURONTIN) capsule 400 mg (has no administration in time range)  albuterol (VENTOLIN HFA) 108 (90 Base) MCG/ACT inhaler 2 puff (has no administration in time range)  enoxaparin (LOVENOX) injection  40 mg (has no administration in time range)  acetaminophen (TYLENOL) tablet 650 mg (has no administration in time range)  docusate sodium (COLACE) capsule 100 mg (has no administration in time range)  polyethylene glycol (MIRALAX / GLYCOLAX) packet 17 g (has no administration in time range)  bisacodyl (DULCOLAX) EC tablet 5 mg (has no administration in time range)  ondansetron (ZOFRAN) tablet 4 mg (has no administration in time range)    Or  ondansetron (ZOFRAN) injection 4 mg (has no administration in time range)  sodium chloride flush (NS) 0.9 % injection 3 mL (has no administration in time range)  sodium chloride 0.9 % bolus 1,000 mL (0 mLs Intravenous Stopped 04/22/19 0120)  acetaminophen (TYLENOL) tablet 650 mg (650 mg Oral Given 04/21/19 2345)  LORazepam (ATIVAN) injection 1 mg (1 mg Intravenous Given 04/21/19 2354)  sodium bicarbonate injection 80 mEq (80 mEq Intravenous Given 04/22/19 0241)  LORazepam (ATIVAN) injection 1 mg (1 mg Intravenous Given 04/22/19 0338)  ziprasidone (  GEODON) injection 10 mg (10 mg Intramuscular Given 04/22/19 0432)  sodium chloride 0.9 % bolus 1,000 mL (0 mLs Intravenous Stopped 04/22/19 0744)  ziprasidone (GEODON) injection 10 mg (10 mg Intramuscular Given 04/22/19 1045)  sterile water (preservative free) injection (1.2 mLs  Given 04/22/19 1045)    Mobility walks High fall risk   Focused Assessments Neuro Assessment Handoff:  Swallow screen pass? No  Cardiac Rhythm: Normal sinus rhythm   Last date known well: 04/19/19   Neuro Assessment: Exceptions to WDL Neuro Checks:      Last Documented NIHSS Modified Score:   Has TPA been given? No If patient is a Neuro Trauma and patient is going to OR before floor call report to 4N Charge nurse: [phone] or [phone]     R Recommendations: See Admitting Provider Note  Report given to:   Additional Notes:

## 2019-04-22 NOTE — ED Notes (Signed)
Confirmed medication admin (Dextros 5% w/ Potassium and sodium bicarb) w/ laura in Pharmacy.

## 2019-04-22 NOTE — Progress Notes (Addendum)
In the ~5 hours this patient has been in 2W29 she has been mostly calm and asleep with intermittent bouts of agitation and screaming. VSS, 2L supplemental O2 added for lower sats (mid/upper 80s) while sleeping, sitter at bedside, bed alarm on. Will continue to monitor closely. Ongoing discussion with MD EJ:YLTE, IV.  Ellwood Handler, RN 04/22/19 5:15 PM  Goals: Obtain and maintain IV access Obtain ammonia level Obtain UDS

## 2019-04-22 NOTE — Progress Notes (Signed)
ABG drawn, results given to ED MD.

## 2019-04-22 NOTE — ED Notes (Signed)
Pt confused and not understanding when redirected. Pt becoming increasingly agitated. Getting out of bed and walking around. MD notified, atavan ordered.

## 2019-04-22 NOTE — Progress Notes (Signed)
Patient unable to speak with NCM  At this time.  NCM contacted Benita Gutter NP  [phone] with [facility], she states she just spoke with patient over the phone on 7/8 and filled her medications for her, Velta Addison states she has had falls and confusion before, she had ordered a neuro eval for her but she probably has not had the follow  Up yet.  She has a follow up apt on 8/4 by telephone.  Velta Addison said to let her know when she is ready to discharge so that she can help assist in any way she can , like with her medications etc.

## 2019-04-22 NOTE — Consult Note (Addendum)
[facility]  INPATIENT CONSULTATION  Reason for Consultation: salicylate toxicity Requesting Provider: Dr. Clayborne DanaMesner  HPI: Mary CharterCheryl Dodson is an 53 y.o. female with h/o bipolar DO, h/o EtOH abuse who presented to the ED tonight with AMS, falls.  Nephrology is consulted due to elevated salicylate level.   Pt unable to provide history currently due to AMS - she is sleepy and per RN is uncooperative when awoken (pulling out IVs,  Monitors, etc).  Neighbor called 911 tonight due to AMS, falls.  Triage T 100, BP 100/53.  Some report of multiple BC powders, chronicity unclear.  Additionally appears she recently started seroquel (fairly high dose) and zoloft.    In ED EtOH level negative, Salicylate level 41.1mg /dL, APAP level < 10.  Cr 2.950.93, K 3.4. ABG 7.35, 33, 66, 92%.  Poison control contacted and recommended calling nephrology.   PMH: Past Medical History:  Diagnosis Date  . Alcohol abuse   . PTSD (post-traumatic stress disorder)   COPD Hypothyroidism Bipolar DO  PSH: History reviewed. No pertinent surgical history.   Past Medical History:  Diagnosis Date  . Alcohol abuse   . PTSD (post-traumatic stress disorder)     Medications:  I have reviewed the patient's current medications. Home med list in chart includes albuterol, BC powder, cogentin, Ca + D, doxepin, gabapentin, ibuprofen, MVI, prilosec, phenergan, seroquel, zoloft, synthroid  (Not in a hospital admission)   ALLERGIES:   Allergies  Allergen Reactions  . Morphine And Related Other (See Comments)    "Makes me crazy"  . Morphine And Related Other (See Comments)    "It is bad"  . Morphine And Related   . Trazodone And Nefazodone Other (See Comments)    FAM HX: Family History  Problem Relation Age of Onset  . Cancer Mother        suicide  . Breast cancer Maternal Grandmother   . Cancer Maternal Grandmother   . Heart attack Father     Social History:   reports that she has been smoking  cigarettes. She has been smoking about 1.00 pack per day. She has never used smokeless tobacco. She reports previous alcohol use. She reports that she does not use drugs.  ROS: unable to obtain from patient due to AMS  Blood pressure (!) 100/50, pulse 88, temperature (!) 103 F (39.4 C), temperature source Rectal, resp. rate 17, SpO2 94 %, unknown if currently breastfeeding. PHYSICAL EXAM: Gen: lying at 45 degrees in bed, sleepy but arousable  Eyes:  Anicteric, EOM grossly intact ENT: MMM Neck: supple, no JVD, no meningismus  CV:  RRR, II/VI SEM Abd:  Soft, nontender Lungs: scattered crackles anteriorly, frequent nonproductive cough GU: no foley Extr:  No edema Neuro: sleeping, arouses to shoulder shake, yells out including "I can't take it" then falls back asleep Skin: warm and dry   Results for orders placed or performed during the hospital encounter of 04/21/19 (from the past 48 hour(s))  CBC with Differential     Status: Abnormal   Collection Time: 04/21/19  9:19 PM  Result Value Ref Range   WBC 9.4 4.0 - 10.5 K/uL   RBC 3.72 (L) 3.87 - 5.11 MIL/uL   Hemoglobin 10.9 (L) 12.0 - 15.0 g/dL   HCT 62.133.4 (L) 30.836.0 - 65.746.0 %   MCV 89.8 80.0 - 100.0 fL   MCH 29.3 26.0 - 34.0 pg   MCHC 32.6 30.0 - 36.0 g/dL   RDW 84.617.1 (H) 96.211.5 - 95.215.5 %   Platelets  264 150 - 400 K/uL   nRBC 0.0 0.0 - 0.2 %   Neutrophils Relative % 75 %   Neutro Abs 7.1 1.7 - 7.7 K/uL   Lymphocytes Relative 15 %   Lymphs Abs 1.4 0.7 - 4.0 K/uL   Monocytes Relative 7 %   Monocytes Absolute 0.7 0.1 - 1.0 K/uL   Eosinophils Relative 1 %   Eosinophils Absolute 0.1 0.0 - 0.5 K/uL   Basophils Relative 1 %   Basophils Absolute 0.1 0.0 - 0.1 K/uL   Immature Granulocytes 1 %   Abs Immature Granulocytes 0.06 0.00 - 0.07 K/uL    Comment: Performed at [facility], [address], [place], [place] [address]  Comprehensive metabolic panel     Status: Abnormal   Collection Time: 04/21/19  9:19 PM  Result Value Ref Range    Sodium 141 135 - 145 mmol/L   Potassium 3.4 (L) 3.5 - 5.1 mmol/L   Chloride 108 98 - 111 mmol/L   CO2 19 (L) 22 - 32 mmol/L   Glucose, Bld 109 (H) 70 - 99 mg/dL   BUN 15 6 - 20 mg/dL   Creatinine, Ser 4.78 0.44 - 1.00 mg/dL   Calcium 8.2 (L) 8.9 - 10.3 mg/dL   Total Protein 6.3 (L) 6.5 - 8.1 g/dL   Albumin 3.4 (L) 3.5 - 5.0 g/dL   AST 295 (H) 15 - 41 U/L   ALT 22 0 - 44 U/L   Alkaline Phosphatase 73 38 - 126 U/L   Total Bilirubin 0.6 0.3 - 1.2 mg/dL   GFR calc non Af Amer >60 >60 mL/min   GFR calc Af Amer >60 >60 mL/min   Anion gap 14 5 - 15    Comment: Performed at [facility], [address], [place], [place] [address]  Ethanol     Status: None   Collection Time: 04/21/19  9:20 PM  Result Value Ref Range   Alcohol, Ethyl (B) <10 <10 mg/dL    Comment: (NOTE) Lowest detectable limit for serum alcohol is 10 mg/dL. For medical purposes only. Performed at [facility], [address], [place], [place] [address]   Acetaminophen level     Status: Abnormal   Collection Time: 04/21/19  9:20 PM  Result Value Ref Range   Acetaminophen (Tylenol), Serum <10 (L) 10 - 30 ug/mL    Comment: (NOTE) Therapeutic concentrations vary significantly. A range of 10-30 ug/mL  may be an effective concentration for many patients. However, some  are best treated at concentrations outside of this range. Acetaminophen concentrations >150 ug/mL at 4 hours after ingestion  and >50 ug/mL at 12 hours after ingestion are often associated with  toxic reactions. Performed at [facility], [address] [place], [place] [address]   Salicylate level     Status: Abnormal   Collection Time: 04/21/19  9:20 PM  Result Value Ref Range   Salicylate Lvl 41.1 (HH) 2.8 - 30.0 mg/dL    Comment: CRITICAL RESULT CALLED TO, READ BACK BY AND VERIFIED WITH: Lita Mains 2232 04/21/2019 WBOND Performed at [facility], [address] [place], [place] [address]   Ammonia     Status: None    Collection Time: 04/21/19  9:21 PM  Result Value Ref Range   Ammonia 26 9 - 35 umol/L    Comment: Performed at [facility], [address] [place], [place] [address]  Lactic acid, plasma  Status: None   Collection Time: 04/21/19  9:45 PM  Result Value Ref Range   Lactic Acid, Venous 0.8 0.5 - 1.9 mmol/L    Comment: Performed at [facility], [address] [place], [place] [address]  I-Stat beta hCG blood, ED     Status: Abnormal   Collection Time: 04/21/19  9:54 PM  Result Value Ref Range   I-stat hCG, quantitative 9.4 (H) <5 mIU/mL   Comment 3            Comment:   GEST. AGE      CONC.  (mIU/mL)   <=1 WEEK        5 - 50     2 WEEKS       50 - 500     3 WEEKS       100 - 10,000     4 WEEKS     1,000 - 30,000        FEMALE AND NON-PREGNANT FEMALE:     LESS THAN 5 mIU/mL   CBG monitoring, ED     Status: Abnormal   Collection Time: 04/21/19 10:02 PM  Result Value Ref Range   Glucose-Capillary 103 (H) 70 - 99 mg/dL  SARS Coronavirus 2 (CEPHEID- Performed in [facility] hospital lab), Hosp Order     Status: None   Collection Time: 04/21/19 10:55 PM   Specimen: Nasopharyngeal Swab  Result Value Ref Range   SARS Coronavirus 2 NEGATIVE NEGATIVE    Comment: (NOTE) If result is NEGATIVE SARS-CoV-2 target nucleic acids are NOT DETECTED. The SARS-CoV-2 RNA is generally detectable in upper and lower  respiratory specimens during the acute phase of infection. The lowest  concentration of SARS-CoV-2 viral copies this assay can detect is 250  copies / mL. A negative result does not preclude SARS-CoV-2 infection  and should not be used as the sole basis for treatment or other  patient management decisions.  A negative result may occur with  improper specimen collection / handling, submission of specimen other  than nasopharyngeal swab, presence of viral mutation(s) within the  areas targeted by this assay, and inadequate number of viral copies  (<250 copies / mL). A  negative result must be combined with clinical  observations, patient history, and epidemiological information. If result is POSITIVE SARS-CoV-2 target nucleic acids are DETECTED. The SARS-CoV-2 RNA is generally detectable in upper and lower  respiratory specimens dur ing the acute phase of infection.  Positive  results are indicative of active infection with SARS-CoV-2.  Clinical  correlation with patient history and other diagnostic information is  necessary to determine patient infection status.  Positive results do  not rule out bacterial infection or co-infection with other viruses. If result is PRESUMPTIVE POSTIVE SARS-CoV-2 nucleic acids MAY BE PRESENT.   A presumptive positive result was obtained on the submitted specimen  and confirmed on repeat testing.  While 2019 novel coronavirus  (SARS-CoV-2) nucleic acids may be present in the submitted sample  additional confirmatory testing may be necessary for epidemiological  and / or clinical management purposes  to differentiate between  SARS-CoV-2 and other Sarbecovirus currently known to infect humans.  If clinically indicated additional testing with an alternate test  methodology [phone] is advised. The SARS-CoV-2 RNA is generally  detectable in upper and lower respiratory sp ecimens during the acute  phase of infection. The expected result is Negative. Fact Sheet for Patients:  BoilerBrush.com.cyhttps://www.fda.gov/media/136312/download Fact Sheet for Healthcare Providers: https://pope.com/https://www.fda.gov/media/136313/download This test is  not yet approved or cleared by the [place] FDA and has been authorized for detection and/or diagnosis of SARS-CoV-2 by FDA under an Emergency Use Authorization (EUA).  This EUA will remain in effect (meaning this test can be used) for the duration of the COVID-19 declaration under Section 564(b)(1) of the Act, 21 U.S.C. section 360bbb-3(b)(1), unless the authorization is terminated or revoked sooner. Performed  at [facility], [address] [place], [place] [address]   I-STAT 7, (LYTES, BLD GAS, ICA, H+H)     Status: Abnormal   Collection Time: 04/22/19 12:11 AM  Result Value Ref Range   pH, Arterial 7.358 7.350 - 7.450   pCO2 arterial 33.2 32.0 - 48.0 mmHg   pO2, Arterial 66.0 (L) 83.0 - 108.0 mmHg   Bicarbonate 18.7 (L) 20.0 - 28.0 mmol/L   TCO2 20 (L) 22 - 32 mmol/L   O2 Saturation 92.0 %   Acid-base deficit 6.0 (H) 0.0 - 2.0 mmol/L   Sodium 142 135 - 145 mmol/L   Potassium 3.7 3.5 - 5.1 mmol/L   Calcium, Ion 1.05 (L) 1.15 - 1.40 mmol/L   HCT 33.0 (L) 36.0 - 46.0 %   Hemoglobin 11.2 (L) 12.0 - 15.0 g/dL   Patient temperature HIDE    Collection site RADIAL, ALLEN'S TEST ACCEPTABLE    Drawn by RT    Sample type ARTERIAL     Dg Chest Portable 1 View  Result Date: 04/21/2019 CLINICAL DATA:  53 year old female with multiple falls, fever, altered mental status. EXAM: PORTABLE CHEST 1 VIEW COMPARISON:  Portable chest 10/01/2016 and earlier. FINDINGS: Portable AP semi upright view at 2145 hours. Lower lung volumes with patchy and indistinct bilateral multifocal pulmonary opacity. Mildly lower lung volumes. Mediastinal contours remain normal. Visualized tracheal air column is within normal limits. No pneumothorax or pleural effusion. Negative visible bowel gas pattern. No acute osseous abnormality identified. IMPRESSION: Patchy and indistinct bilateral pulmonary opacity suspicious for bilateral pneumonia. Consider viral/atypical etiology. No pleural effusion. Electronically Signed   By: Odessa FlemingH  Hall M.D.   On: 04/21/2019 21:58    Assessment/Plan **Salicylate toxicity:  Pt with unclear history however by report multiple BC powders/day, chronicity unclear + elevated salicylate level at 41mg /dL.  Given normal renal function, normal electrolyte and not tremendously elevated level I think conservative management with bicarbonate administration (ED giving bicarb bolus followed but gtt now) is  reasonable at this time versus dialysis.  Her 1st level was checked 9:20pm, ED to send another level now and q3-4hr per toxicology recommendation.  Certainly if salicylate level rising, failing to improve and mental status not improving would reconsider dialysis but at this moment it's not indicated.  Follow strict I/Os (may need foley if can be safely inserted), serial labs.    **AMS:  Presumed APAP toxicity at this point but full Utox pending and history not completely clear at this time.  CT head pending.  Despite fever, no meningeal signs.   **AGMA:  When corrected for albumin, mild. Certainly can be explained by salicylate intoxication.  She does not have a lactic acidosis or EtOH.  Can check Ethylene glycol level for completeness. Interestingly no respiratory alkalosis despite salicylate toxicity.     **hypokalemia:  Agree with repletion - necessary for urinary alkalinization to be effective.  Monitor serially to make sure improving.   **Possible pneumonia:  CXR with patchy bilateral opacities, borderline fever on presentation.  Rapid COVID negative.  S/p CAP antibiotics.  Defer repeat COVID testing, antibiotics to  primary. Does not appear pulmonary edema on review of CXR.   **Anemia: mild, not an acute issue, monitor.   Justin Mend 04/22/2019, 12:58 AM

## 2019-04-22 NOTE — ED Provider Notes (Signed)
Assumed care from [place] at shift change.  See prior note for full H&P.  Briefly, 53 year old female here with altered mental status.  Reportedly has been falling a lot over the past week or so.  She does have history of headaches and based on chart review has a habit of taking a lot of Goody powder.  She is also on Seroquel and Zoloft.  She has been coughing on exam here and does have a noted temp of 103F.    Plan: Does have elevated salicylate level due to goody powders, but with fever differential is broad.  Recent initiation of SSRI's, serotonin syndrome?  She has chest x-ray revealing bilateral infiltrates, COVID swab is pending.  She was started on IV fluids and antibiotics.  Awaiting head/neck CT given recurrent falls.  Will require admission.   3:08 AM Patient up and out bed wandering around in room. She is refusing all care.  At this point, she does not have capacity to make logical decisions.  She was given ativan earlier which seemed to help so will repeat this.  Once head CT is done, can admit.  4:19 AM Ativan without improvement.  Patient still trying to get out of bed and leave, yelling and cursing, pushing RN away.  She was given dose of Geodon.  5:11 AM CT able to be obtained.  Patient has pulled out all IV's that have been inserted.  IV team contacted, however state they cannot place another as they have exhausted their attempts.  5:37 AM CT head negative.  No acute findings.  Again noted ground glass opacities.  Patient is sleeping currently.  Salicylate levels are downtrending.  Will discuss with hospitalist for admission.  5:50 AM Discussed with Dr. Myna Hidalgo-- admission orders to be placed.  CRITICAL CARE Performed by: Larene Pickett   Total critical care time: 35 minutes  Critical care time was exclusive of separately billable procedures and treating other patients.  Critical care was necessary to treat or prevent imminent or life-threatening deterioration.  Critical  care was time spent personally by me on the following activities: development of treatment plan with patient and/or surrogate as well as nursing, discussions with consultants, evaluation of patient's response to treatment, examination of patient, obtaining history from patient or surrogate, ordering and performing treatments and interventions, ordering and review of laboratory studies, ordering and review of radiographic studies, pulse oximetry and re-evaluation of patient's condition.    Larene Pickett, PA-C 04/22/19 [id number]    Merrily Pew, MD 04/22/19 [phone]

## 2019-04-22 NOTE — Progress Notes (Addendum)
[facility] Progress Note   Subjective:   Moved to 2W as PUI given bilateral PNA and low calcitonin  - high index of suspicion for COVID despite neg screening test.  Per notes reviewed patient has remained altered and agitated - ativan and geodon have been administered.    Objective Vitals:   04/22/19 0930 04/22/19 0945 04/22/19 1030 04/22/19 1147  BP: 110/63 116/72 102/64 101/76  Pulse: 86 81 91 92  Resp: 17 14 (!) 23 20  Temp:    98.6 F (37 C)  TempSrc:    Axillary  SpO2: 95% 96% 97% 93%  Weight:    86.6 kg  Height:    5\' 4"  (1.626 m)   Physical Exam Face-to-face physical examination was not performed d/t COVID-19 pandemic in effort to preserve PPE and avoid infecting other providers. Detailed review of the medical record, direct communication with primary team, and thorough accounting of tests and studies has been performed.   Additional Objective Labs: Basic Metabolic Panel: Recent Labs  Lab 04/21/19 2119 04/22/19 0011 04/22/19 0104 04/22/19 0744  NA 141 142 140 144  K 3.4* 3.7 3.2* 3.5  CL 108  --  111 114*  CO2 19*  --  17* 20*  GLUCOSE 109*  --  105* 106*  BUN 15  --  14 12  CREATININE 0.93  --  0.79 0.66  CALCIUM 8.2*  --  7.5* 7.4*  PHOS  --   --   --  2.2*   Liver Function Tests: Recent Labs  Lab 04/21/19 2119 04/22/19 0744  AST 117*  --   ALT 22  --   ALKPHOS 73  --   BILITOT 0.6  --   PROT 6.3*  --   ALBUMIN 3.4* 2.7*   No results for input(s): LIPASE, AMYLASE in the last 168 hours. CBC: Recent Labs  Lab 04/21/19 2119 04/22/19 0011  WBC 9.4  --   NEUTROABS 7.1  --   HGB 10.9* 11.2*  HCT 33.4* 33.0*  MCV 89.8  --   PLT 264  --    Blood Culture No results found for: SDES, SPECREQUEST, CULT, REPTSTATUS  Cardiac Enzymes: No results for input(s): CKTOTAL, CKMB, CKMBINDEX, TROPONINI in the last 168 hours. CBG: Recent Labs  Lab 04/21/19 2202  GLUCAP 103*   Iron Studies:  Recent Labs    04/22/19 0104  FERRITIN 19    @[email or web address] @ Studies/Results: Ct Head Wo Contrast  Result Date: 04/22/2019 CLINICAL DATA:  Altered mental status.  History of alcohol abuse EXAM: CT HEAD WITHOUT CONTRAST CT CERVICAL SPINE WITHOUT CONTRAST TECHNIQUE: Multidetector CT imaging of the head and cervical spine was performed following the standard protocol without intravenous contrast. Multiplanar CT image reconstructions of the cervical spine were also generated. COMPARISON:  10/01/2018 FINDINGS: CT HEAD FINDINGS Brain: No evidence of acute infarction, hemorrhage, hydrocephalus, extra-axial collection or mass lesion/mass effect. Vascular: No hyperdense vessel or unexpected calcification. Skull: Normal. Negative for fracture or focal lesion. Sinuses/Orbits: No acute finding. CT CERVICAL SPINE FINDINGS Alignment: When allowing for positioning there is no malalignment. Skull base and vertebrae: Negative for fracture or bone lesion Soft tissues and spinal canal: No prevertebral fluid or swelling. No visible canal hematoma. Postoperative lateral left neck. Disc levels:  C6-7 disc degeneration. Upper chest: Minimal ground-glass opacity in the right apical lung. Chest x-ray was performed a few hours earlier and reported similar findings. IMPRESSION: No acute intracranial or cervical spine finding. Electronically Signed   By: Monte Fantasia  M.D.   On: 04/22/2019 05:24   Ct Cervical Spine Wo Contrast  Result Date: 04/22/2019 CLINICAL DATA:  Altered mental status.  History of alcohol abuse EXAM: CT HEAD WITHOUT CONTRAST CT CERVICAL SPINE WITHOUT CONTRAST TECHNIQUE: Multidetector CT imaging of the head and cervical spine was performed following the standard protocol without intravenous contrast. Multiplanar CT image reconstructions of the cervical spine were also generated. COMPARISON:  10/01/2018 FINDINGS: CT HEAD FINDINGS Brain: No evidence of acute infarction, hemorrhage, hydrocephalus, extra-axial collection or mass lesion/mass effect. Vascular:  No hyperdense vessel or unexpected calcification. Skull: Normal. Negative for fracture or focal lesion. Sinuses/Orbits: No acute finding. CT CERVICAL SPINE FINDINGS Alignment: When allowing for positioning there is no malalignment. Skull base and vertebrae: Negative for fracture or bone lesion Soft tissues and spinal canal: No prevertebral fluid or swelling. No visible canal hematoma. Postoperative lateral left neck. Disc levels:  C6-7 disc degeneration. Upper chest: Minimal ground-glass opacity in the right apical lung. Chest x-ray was performed a few hours earlier and reported similar findings. IMPRESSION: No acute intracranial or cervical spine finding. Electronically Signed   By: Marnee SpringJonathon  Watts M.D.   On: 04/22/2019 05:24   Dg Chest Portable 1 View  Result Date: 04/21/2019 CLINICAL DATA:  53 year old female with multiple falls, fever, altered mental status. EXAM: PORTABLE CHEST 1 VIEW COMPARISON:  Portable chest 10/01/2016 and earlier. FINDINGS: Portable AP semi upright view at 2145 hours. Lower lung volumes with patchy and indistinct bilateral multifocal pulmonary opacity. Mildly lower lung volumes. Mediastinal contours remain normal. Visualized tracheal air column is within normal limits. No pneumothorax or pleural effusion. Negative visible bowel gas pattern. No acute osseous abnormality identified. IMPRESSION: Patchy and indistinct bilateral pulmonary opacity suspicious for bilateral pneumonia. Consider viral/atypical etiology. No pleural effusion. Electronically Signed   By: Odessa FlemingH  Hall M.D.   On: 04/21/2019 21:58   Medications: . dextrose 5 % 900 mL with potassium chloride 40 mEq, sodium bicarbonate 100 mEq infusion Stopped (04/22/19 0944)   . benztropine  0.5 mg Oral QHS  . docusate sodium  100 mg Oral BID  . enoxaparin (LOVENOX) injection  40 mg Subcutaneous Q24H  . gabapentin  400 mg Oral TID WC & HS  . levothyroxine  50 mcg Oral Daily  . pantoprazole  40 mg Oral Daily  . potassium chloride   40 mEq Oral Once  . QUEtiapine  100 mg Oral BID WC  . QUEtiapine  400 mg Oral QHS  . sertraline  100 mg Oral QPC breakfast  . sodium chloride flush  3 mL Intravenous Q12H    Assessment/Plan: **Salicylate toxicity:  Pt with unclear history however by report multiple BC powders/day, chronicity unclear + elevated salicylate level at 41mg /dL.  Given normal renal function, normal electrolyte and not tremendously elevated level she has been managed conservatively with bicarbonate administration (as much as agitation has allowed).  Salicylate level has been trending down, last was 22.9 at 0744 this AM.  I think continued conservative care would be preferential at this time in light of her agitation and pulling out lines.  Continue to trend salicylate level for now to ensure continues to improve.    **AMS:  Presumed salicylate toxicity at this point but full Utox pending and history not completely clear at this time.  CT head neg acute issues.  Despite fever, no meningeal signs.   **AGMA:  When corrected for albumin, mild. Certainly can be explained by salicylate intoxication.  She does not have a lactic acidosis or EtOH.  Can check Ethylene glycol level pending for completeness. Interestingly no respiratory alkalosis despite salicylate toxicity.     **hypokalemia:  Improved with repletion.  Normokalemia needed for urinary alkalinization to be effective.  Monitor serially to make sure not dropping.   **Possible pneumonia:  CXR with patchy bilateral opacities, borderline fever on presentation.  Rapid COVID negative.  S/p CAP antibiotics.  Defer repeat COVID testing, antibiotics to primary. Does not appear pulmonary edema on review of CXR. She is considered a PUI at this point.   **Anemia: mild, not an acute issue, monitor.   I will sign off as she is improving with conservative management.  Please call back if I can further assist with her care.   Estill BakesLindsay  MD 04/22/2019, 1:17 PM  [facility] Pager: [phone]

## 2019-04-22 NOTE — Progress Notes (Addendum)
Pt scored 16 on CIWA but one time dose of Geodon given rather than ativan. Plan is to attempt IV access once medication becomes effective. Will continue to monitor.   Midline able to be placed x2 assistance.  2121 Ativan given afterwards due to no decrease in CIWA. Will reassess in 1 hr

## 2019-04-22 NOTE — ED Notes (Addendum)
I-Stat Arterial Blood Gas has yet to be drawn. Was clicked off by mistake.

## 2019-04-22 NOTE — ED Notes (Addendum)
pt c/o pain to R arm at iv site; site noted to be slightly swollen, warm; iv removed, pharmacy called; Dr. Lorin Mercy notified

## 2019-04-22 NOTE — ED Notes (Signed)
Nurse will collect gold top tube.

## 2019-04-22 NOTE — ED Notes (Signed)
Assisted pt to bedside commode; pt urinated and had BM; pt cleaned and placed back in bed

## 2019-04-22 NOTE — Progress Notes (Signed)
Patient sats dropped to upper 80s while sleeping, placed on 2L O2. 73 mid-90s currently. Will continue to monitor.  Ellwood Handler, RN 04/22/19 1:40 PM

## 2019-04-22 NOTE — ED Notes (Signed)
Pt becoming extremely agitated and progressively confused. Making verbal threats and refusing care. Pt stood up and ripped out IV on left arm and attempted to get dressed. Pt's statements not making sense. Pt redirected to bed and provider notified.

## 2019-04-22 NOTE — ED Notes (Signed)
Applied warm compress and elevated infiltrated extremity per pharmacy consult

## 2019-04-22 NOTE — ED Notes (Signed)
IV team unable to get IV due to pt being uncooperative; will try again once pt upstairs and more calm; 2W RN, Lilia Pro, aware

## 2019-04-22 NOTE — H&P (Signed)
History and Physical    Mary Dodson Postma ZHY:[id number] DOB: 15-Feb-1966 DOA: 04/21/2019  PCP: [name], [name], NP Consultants:  Everlena CooperJaffe - neurology Patient coming from:  Home ; NOK: Janett BillowFriend, Barbara Pruitt, [phone]   Chief Complaint: AMS  HPI: Mary Dodson Thor is a 53 y.o. female with medical history significant of PTSD; bipolar d/o; ETOH dependence; and hypothyroidism presenting with AMS.  She was seen by neurology in 6/20 for episodic headache with confusion/delirium, possibly due to complicated migraines vs. Atypical seizure.  She had a normal awake and asleep EEG on 6/17.  EMS was called yesterday evening by her friend due to concerns of recurrent falls, AMS.  She was seen last night by PA Joy, and the patient was alert enough to answer questions (in a belligerent and somewhat nonsensical manner).   There was concern for sepsis and she was given a 1L bolus of LR and Rocephin/Azithromycin after she had fever.  She was found to have an elevated salicylate level and Poison Control was contacted.  They recommended calling nephrology (dialysis not indicated) and following the level.  She was started on a bicarb drip mixed with KCl and D5W.  CXR showed B PNA and O2 sats dropped to 90% so she was started on 2L O2.  She required Ativan and Geodon IM overnight due to periodic severe agitation.  She pulled out all of her tubes (foley, multiple IVs).  Decision to admit was finally made at 550AM and so the patient was carried over to me this AM. At the time of my evaluation, she was not able to answer questions and had nonsensical speech.  She was quite clear about how she felt about the repeat COVID nasal swab - clearly, she did not appreciate this.  Additionally, she was attempting to eat her pulse oximeter and she also appeared to disapprove of this intervention.  I spoke to her NOK.  She has complained of severe headaches for the last few weeks.  She has complained of feeling like she was in a fog.  She "comes  in contact with those people who got that COVID infection."  She said she was taking lots of BC powders.  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  She goes to AA - hasn't had "nothing in about 3 years."  "If there was a for sure test, I would almost be willing to bet" COVID is her diagnosis.   ED Course:  Carryover, per Dr. Antionette Charpyd:  [age] yof with hx of alcoholism, bipolar disorder, and hypothyroidism presents after friend called EMS to report worsening confusion since July 9th. She is febrile in ED with bilateral opacities on CXR, negative procalcitonin, and sat 90% on rm air. Salicylyate level elevated, TSH low, d-dimer elevated.   Nephrology is following the salicylate toxicity, treating conservatively for now. She was cultured and given antibiotics for possible PNA. Initial COVID-19 negative but given fever and bilateral CXR findings with low procalcitonin, this is being repeated.   She has been confused and intermittently agitated in ED requiring Ativan and Geodon.   Review of Systems: Unable to perform  Ambulatory Status:  Ambulates without assistance  Past Medical History:  Diagnosis Date   Alcohol abuse    Bipolar disease, chronic (HCC)    Hypothyroidism (acquired)    PTSD (post-traumatic stress disorder)  History reviewed. No pertinent surgical history.  Social History   Socioeconomic History   Marital status: Single    Spouse name: Not on file   Number of children: 2   Years of education: Not on file   Highest education level: Bachelor's degree (e.g., BA, AB, BS)  Occupational History   Occupation: unemployed  Scientist, product/process development strain: Not on file   Food insecurity    Worry: Not on file    Inability: Not on file   Transportation needs    Medical: Not on file    Non-medical: Not  on file  Tobacco Use   Smoking status: Current Every Day Smoker    Packs/day: 1.00    Types: Cigarettes   Smokeless tobacco: Never Used  Substance and Sexual Activity   Alcohol use: Not Currently    Comment: recovering alcoholic   Drug use: No   Sexual activity: Not Currently  Lifestyle   Physical activity    Days per week: Not on file    Minutes per session: Not on file   Stress: Not on file  Relationships   Social connections    Talks on phone: Not on file    Gets together: Not on file    Attends religious service: Not on file    Active member of club or organization: Not on file    Attends meetings of clubs or organizations: Not on file    Relationship status: Not on file   Intimate partner violence    Fear of current or ex partner: Not on file    Emotionally abused: Not on file    Physically abused: Not on file    Forced sexual activity: Not on file  Other Topics Concern   Not on file  Social History Narrative   ** Merged History Encounter **       ** Merged History Encounter **      Patient is right-handed. She lives alone in a one level home. She drinks 5 cups of coffee a week, and 6 cans of Diet-Pepsi a day. Patient states she uses 6-8 BC powders a day.         Allergies  Allergen Reactions   Morphine And Related Other (See Comments)    "Makes me crazy"   Morphine And Related Other (See Comments)    "It is bad"   Morphine And Related    Trazodone And Nefazodone Other (See Comments)    Family History  Problem Relation Age of Onset   Cancer Mother        suicide   Breast cancer Maternal Grandmother    Cancer Maternal Grandmother    Heart attack Father     Prior to Admission medications   Medication Sig Start Date End Date Taking? Authorizing Provider  albuterol (VENTOLIN HFA) 108 (90 Base) MCG/ACT inhaler Inhale 2 puffs into the lungs every 6 (six) hours as needed for wheezing or shortness of breath.    [provider]    Amino Acids (AMINO ACID PO) Take 1 tablet by mouth daily.    [provider]  Aspirin-Salicylamide-Caffeine (BC HEADACHE PO) Take 6-7 packets by mouth daily as needed (headache).    [provider]  benztropine (COGENTIN) 0.5 MG tablet Take 0.5 mg by mouth at bedtime. 02/20/18   [provider]  calcium carbonate (OSCAL) 1500 (600 Ca) MG TABS tablet Take 600 mg of elemental calcium by mouth daily with breakfast.    [provider]  doxepin (SINEQUAN) 75 MG capsule Take 75 mg by mouth at bedtime as needed (sleep).  04/07/19   [provider]  gabapentin (NEURONTIN) 400 MG capsule Take 2 capsules (800 mg total) by mouth 4 (four) times daily -  with meals and at bedtime. Patient taking differently: Take 400 mg by mouth 4 (four) times daily -  with meals and at bedtime.  10/06/16   Oneta RackLewis, Tanika N, NP  ibuprofen (ADVIL) 800 MG tablet Take 800 mg by mouth 3 (three) times daily as needed for fever or headache (pain).  04/17/19   [provider]  Multiple Vitamins-Minerals (MULTIVITAMIN WITH MINERALS) tablet Take 1 tablet by mouth daily.    [provider]  Omega-3 Fatty Acids (FISH OIL OMEGA-3 PO) Take 1 tablet by mouth daily.    [provider]  omeprazole (PRILOSEC) 40 MG capsule Take 40 mg by mouth daily. 05/27/18   [provider]  promethazine (PHENERGAN) 25 MG tablet Take 25 mg by mouth daily as needed for nausea or vomiting.  05/27/18   [provider]  QUEtiapine (SEROQUEL) 100 MG tablet Take 1 tablet (100 mg total) by mouth 2 (two) times daily. Patient taking differently: Take 100 mg by mouth 2 (two) times daily with breakfast and lunch. Also take 400 mg at bedtime 10/06/16   Oneta RackLewis, Tanika N, NP  QUEtiapine (SEROQUEL) 400 MG tablet Take 400 mg by mouth at bedtime. Also take 100 mg with breakfast and lunch 04/14/19   [provider]  sertraline (ZOLOFT) 100 MG tablet Take 100 mg by mouth daily after  breakfast. 04/14/19   [provider]  SYNTHROID 50 MCG tablet Take 1 tablet (50 mcg total) by mouth daily. 03/20/18   Hoy RegisterNewlin, Enobong, MD    Physical Exam: Vitals:   04/22/19 1030 04/22/19 1147 04/22/19 1259 04/22/19 1659  BP: 102/64 101/76  (!) 109/55  Pulse: 91 92  89  Resp: (!) 23 20  19   Temp:  98.6 F (37 C)  98.4 F (36.9 C)  TempSrc:  Axillary  Oral  SpO2: 97% 93% (!) 86% 96%  Weight:  86.6 kg    Height:  5\' 4"  (1.626 m)        General:  Appears calm and comfortable when not being disturbed but extremely agitated when being bothered in any way; she is unable to provide a clear history or answer questions and follows few commands  Eyes:  PERRL, EOMI, normal lids, iris  ENT:  grossly normal hearing, lips & tongue, mmm; poor dentition  Neck:  no LAD, masses or thyromegaly  Cardiovascular:  RRR, no m/r/g. No LE edema.   Respiratory:   CTA bilaterally with no wheezes/rales/rhonchi.  Normal respiratory effort on [place] O2.  Abdomen:  soft, NT, ND, NABS  Skin:  no rash or induration seen on limited exam  Musculoskeletal:  grossly normal tone BUE/BLE, good ROM, no bony abnormality  Lower extremity:  No LE edema.  Limited foot exam with no ulcerations.  2+ distal pulses.  Psychiatric:  Agitated easily and belligerent at times, clearly altered - for example, while attempting to gnaw on her pulse oximeter Neurologic:  Unable to perform   Radiological Exams on Admission: Ct Head Wo Contrast  Result Date: 04/22/2019 CLINICAL DATA:  Altered mental status.  History of alcohol abuse EXAM: CT HEAD WITHOUT CONTRAST CT CERVICAL SPINE WITHOUT CONTRAST TECHNIQUE: Multidetector CT imaging of the head and cervical spine was performed following the standard protocol without intravenous  contrast. Multiplanar CT image reconstructions of the cervical spine were also generated. COMPARISON:  10/01/2018 FINDINGS: CT HEAD FINDINGS Brain: No evidence of acute infarction, hemorrhage,  hydrocephalus, extra-axial collection or mass lesion/mass effect. Vascular: No hyperdense vessel or unexpected calcification. Skull: Normal. Negative for fracture or focal lesion. Sinuses/Orbits: No acute finding. CT CERVICAL SPINE FINDINGS Alignment: When allowing for positioning there is no malalignment. Skull base and vertebrae: Negative for fracture or bone lesion Soft tissues and spinal canal: No prevertebral fluid or swelling. No visible canal hematoma. Postoperative lateral left neck. Disc levels:  C6-7 disc degeneration. Upper chest: Minimal ground-glass opacity in the right apical lung. Chest x-ray was performed a few hours earlier and reported similar findings. IMPRESSION: No acute intracranial or cervical spine finding. Electronically Signed   By: Marnee SpringJonathon  Watts M.D.   On: 04/22/2019 05:24   Ct Cervical Spine Wo Contrast  Result Date: 04/22/2019 CLINICAL DATA:  Altered mental status.  History of alcohol abuse EXAM: CT HEAD WITHOUT CONTRAST CT CERVICAL SPINE WITHOUT CONTRAST TECHNIQUE: Multidetector CT imaging of the head and cervical spine was performed following the standard protocol without intravenous contrast. Multiplanar CT image reconstructions of the cervical spine were also generated. COMPARISON:  10/01/2018 FINDINGS: CT HEAD FINDINGS Brain: No evidence of acute infarction, hemorrhage, hydrocephalus, extra-axial collection or mass lesion/mass effect. Vascular: No hyperdense vessel or unexpected calcification. Skull: Normal. Negative for fracture or focal lesion. Sinuses/Orbits: No acute finding. CT CERVICAL SPINE FINDINGS Alignment: When allowing for positioning there is no malalignment. Skull base and vertebrae: Negative for fracture or bone lesion Soft tissues and spinal canal: No prevertebral fluid or swelling. No visible canal hematoma. Postoperative lateral left neck. Disc levels:  C6-7 disc degeneration. Upper chest: Minimal ground-glass opacity in the right apical lung. Chest x-ray was  performed a few hours earlier and reported similar findings. IMPRESSION: No acute intracranial or cervical spine finding. Electronically Signed   By: Marnee SpringJonathon  Watts M.D.   On: 04/22/2019 05:24   Dg Chest Portable 1 View  Result Date: 04/21/2019 CLINICAL DATA:  53 year old female with multiple falls, fever, altered mental status. EXAM: PORTABLE CHEST 1 VIEW COMPARISON:  Portable chest 10/01/2016 and earlier. FINDINGS: Portable AP semi upright view at 2145 hours. Lower lung volumes with patchy and indistinct bilateral multifocal pulmonary opacity. Mildly lower lung volumes. Mediastinal contours remain normal. Visualized tracheal air column is within normal limits. No pneumothorax or pleural effusion. Negative visible bowel gas pattern. No acute osseous abnormality identified. IMPRESSION: Patchy and indistinct bilateral pulmonary opacity suspicious for bilateral pneumonia. Consider viral/atypical etiology. No pleural effusion. Electronically Signed   By: Odessa FlemingH  Hall M.D.   On: 04/21/2019 21:58    EKG: Independently reviewed.  NSR with rate 88; nonspecific ST changes with no evidence of acute ischemia   Labs on Admission: I have personally reviewed the available labs and imaging studies at the time of the admission.  Pertinent labs:   ABG: 7.358/33.2/66.0/18.7 K+ 3.2 CO2 17 Glucose 105 AST 117/ALT 22 CRP 16.8 Lactate 0.7 Procalcitonin <0.10 Salicylate at 0104: 30.9; 2120: 41.1 COVID negative x 2 HCG 9.4 TSH 0.210 UDS pending Ammonia level pending  Assessment/Plan Principal Problem:   Acute encephalopathy Active Problems:   Alcohol use disorder, severe, dependence (HCC)   Bipolar disorder, current episode manic severe with psychotic features (HCC)   Hypothyroidism (acquired)   Acute on chronic respiratory failure with hypoxia (HCC)   Salicylate poisoning    Acute encephalopathy -Patient with apparent recent h/o episodic encephalopathy, has been seeing  neurology with recent negative  EEG -Concern for toxic encephalopathy - UDS is pending and she was salicylate toxic (see below) -She also has apparent respiratory failure and this presentation could be consistent with COVID encephalitis (see below) -For now, will give PO medications when possible and continue to give IM Geodon prn - as this appears to be most efficacious for her  -Neurology has been asked to see the patient and is likely to consult overnight  Acute on chronic respiratory failure with hypoxia -Patient with fever to 103 while in the ER with hypoxia -She does not have a usual home O2 requirement and is currently requiring 2L [place] O2 -Possible COVID contacts - she panhandles "for a living" -She has had 2 negative COVID tests but has a normal procalcitonin and still appears to have labs and findings c/w COVID-19 infection and so has remained on COVID precautions -Pertinent labs concerning for COVID include normal WBC count;  increased LFTs; low procalcitonin; markedly elevated D-dimer (>1); markedly elevated CRP (>7) -CXR with multifocal opacities which may be c/w COVID vs. Multifocal PNA -Will treat with broad-spectrum antibiotics despite procalcitonin <0.1 given ongoing uncertainty of diagnosis -Will admit to PCU for further evaluation, close monitoring, and treatment -Monitor on telemetry x at least 24 hours -At this time, will attempt to avoid use of aerosolized medications and use HFAs instead -Will check daily labs including BMP with Mag, Phos; LFTs; CBC with differential; CRP; ferritin; fibrinogen; D-dimer -If the patient shows clinical deterioration, consider transfer to ICU with PCCM consultation -Patient was seen wearing full PPE including: gown, gloves, head cover, N95, and face shield; donning and doffing was in compliance with current standards. -She does have a reported h/o COPD and so it is possible that her hypoxia is more chronic in nature, but her ABG did appreciate mild hypoxia without  hypercapnia  ASA toxicity -Not able to provide history but does have report of taking BC headache powders at home -Salicylate level was 41 at presentation and has decreased to [facility] -Nephrology recommended serial UA and IVF but the ability to intervene was limited by patient agitation and compliance -Nephrology has signed off, as it does not appear that the patient will require dialysis -Can repeat ASA level eventually but given that she is not receiving any aspirin-containing products, there does not appear to be an urgency for this at this time -K+ was repleted, and per neurology this is necessary for urinary alkalinization to be effective  ETOH dependence -Patient has reported long-standing h/o ETOH dependence -It is not clear when her last drink was but her ETOH level was negative on presentation and also on 10/01/18 -Overall, her presentation is not particularly consistent with ETOH withdrawal, but will add CIWA monitoring and treatment empirically in case this is contributing to her encephalopathy  Bipolar -Home meds were continued - if she is able to take PO -Continue Doxepin, Zoloft, Cogentin -Seroquel was initially given, but since she is continuing to require intermittent doses of Geodon due to severe agitation, further doses are held for now -If this is not clearing and is thought to be a primarily psychiatric condition, psychiatry consult may be indicated tomorrow -However, for now, there appear to be several medical conditions that may be causing her symptoms and so this has been held  Hypothyroidism -Her TSH is suppressed, but her free T4 is low rather than high -Suspect sick euthyroid rather than true thyroid dysfunction at this time -Normal thyroid testing in 6/19  DVT prophylaxis:  Lovenox  Code Status:  Full  Family Communication: None present; I spoke with the patient's friend/NOK by telephone. Disposition Plan:  Home once clinically improved Consults called:  Nephrology; neurology Admission status: Admit - It is my clinical opinion that admission to INPATIENT is reasonable and necessary because of the expectation that this patient will require hospital care that crosses at least 2 midnights to treat this condition based on the medical complexity of the problems presented.  Given the aforementioned information, the predictability of an adverse outcome is felt to be significant.    Jonah Blue MD [facility]   How to contact the [facility] Attending or Consulting provider 7A - 7P or covering provider during after hours 7P -7A, for this patient?  1. Check the care team in [facility] and look for a) attending/consulting [facility] provider listed and b) the [facility] team listed 2. Log into [email or web address] and use [place]'s universal password to access. If you do not have the password, please contact the hospital operator. 3. Locate the [facility] provider you are looking for under [facility] and page to a number that you can be directly reached. 4. If you still have difficulty reaching the provider, please page the [facility] (Director on Call) for the Hospitalists listed on amion for assistance.   04/22/2019, 6:00 PM

## 2019-04-22 NOTE — Progress Notes (Signed)
Unsuccessful attempt. Unable to placed a midline due to patient uncooperative and very agitated. Sitter at bedside holding the patient. Patient started to got up when stuck with the needle and patient yelling out "no more". RN made aware.

## 2019-04-22 NOTE — ED Notes (Signed)
Patient is calmly resting at present.

## 2019-04-22 NOTE — ED Notes (Signed)
Went into patients room to find her sitting in a chair pulled her IV out, states she needs to leave, Patient sheets cleaned on stretcher and patient re-directed back to bed  Re-oriented on the reason why she needed to stay on the stretcher and her plan of care given again, patient is alert however confused. PA at bedside and medications ordered.

## 2019-04-23 ENCOUNTER — Inpatient Hospital Stay (HOSPITAL_COMMUNITY): Payer: Self-pay

## 2019-04-23 LAB — COMPREHENSIVE METABOLIC PANEL
ALT: 22 U/L (ref 0–44)
AST: 49 U/L — ABNORMAL HIGH (ref 15–41)
Albumin: 2.8 g/dL — ABNORMAL LOW (ref 3.5–5.0)
Alkaline Phosphatase: 71 U/L (ref 38–126)
Anion gap: 10 (ref 5–15)
BUN: <5 mg/dL — ABNORMAL LOW (ref 6–20)
CO2: 27 mmol/L (ref 22–32)
Calcium: 8.1 mg/dL — ABNORMAL LOW (ref 8.9–10.3)
Chloride: 105 mmol/L (ref 98–111)
Creatinine, Ser: 0.58 mg/dL (ref 0.44–1.00)
GFR calc Af Amer: >60 mL/min (ref >60)
GFR calc non Af Amer: >60 mL/min (ref >60)
Glucose, Bld: 193 mg/dL — ABNORMAL HIGH (ref 70–99)
Potassium: 3.5 mmol/L (ref 3.5–5.1)
Sodium: 142 mmol/L (ref 135–145)
Total Bilirubin: 0.4 mg/dL (ref 0.3–1.2)
Total Protein: 5.6 g/dL — ABNORMAL LOW (ref 6.5–8.1)

## 2019-04-23 LAB — URINE CULTURE

## 2019-04-23 LAB — CBC WITH DIFFERENTIAL/PLATELET
Abs Immature Granulocytes: 0.06 10*3/uL (ref 0.00–0.07)
Basophils Absolute: 0 10*3/uL (ref 0.0–0.1)
Basophils Relative: 0 %
Eosinophils Absolute: 0 10*3/uL (ref 0.0–0.5)
Eosinophils Relative: 0 %
HCT: 28.5 % — ABNORMAL LOW (ref 36.0–46.0)
Hemoglobin: 9.3 g/dL — ABNORMAL LOW (ref 12.0–15.0)
Immature Granulocytes: 1 %
Lymphocytes Relative: 14 %
Lymphs Abs: 1.3 10*3/uL (ref 0.7–4.0)
MCH: 29.3 pg (ref 26.0–34.0)
MCHC: 32.6 g/dL (ref 30.0–36.0)
MCV: 89.9 fL (ref 80.0–100.0)
Monocytes Absolute: 1.1 10*3/uL — ABNORMAL HIGH (ref 0.1–1.0)
Monocytes Relative: 12 %
Neutro Abs: 6.8 10*3/uL (ref 1.7–7.7)
Neutrophils Relative %: 73 %
Platelets: 219 10*3/uL (ref 150–400)
RBC: 3.17 MIL/uL — ABNORMAL LOW (ref 3.87–5.11)
RDW: 16.7 % — ABNORMAL HIGH (ref 11.5–15.5)
WBC: 9.3 10*3/uL (ref 4.0–10.5)
nRBC: 0 % (ref 0.0–0.2)

## 2019-04-23 LAB — SARS CORONAVIRUS 2 BY RT PCR (HOSPITAL ORDER, PERFORMED IN ~~LOC~~ HOSPITAL LAB): SARS Coronavirus 2: NEGATIVE

## 2019-04-23 LAB — FERRITIN: Ferritin: 39 ng/mL (ref 11–307)

## 2019-04-23 LAB — D-DIMER, QUANTITATIVE: D-Dimer, Quant: 1.24 ug/mL-FEU — ABNORMAL HIGH (ref 0.00–0.50)

## 2019-04-23 LAB — MAGNESIUM: Magnesium: 1.7 mg/dL (ref 1.7–2.4)

## 2019-04-23 LAB — PHOSPHORUS
Phosphorus: 1.4 mg/dL — ABNORMAL LOW (ref 2.5–4.6)
Phosphorus: 1.8 mg/dL — ABNORMAL LOW (ref 2.5–4.6)

## 2019-04-23 LAB — C-REACTIVE PROTEIN: CRP: 18.3 mg/dL — ABNORMAL HIGH (ref <1.0)

## 2019-04-23 MED ORDER — LORAZEPAM 2 MG/ML IJ SOLN: 2.0000 mg | INTRAMUSCULAR | Status: DC | PRN

## 2019-04-23 MED ORDER — DEXTROSE 5 % IV SOLN
10.0000 mg/kg | Freq: Three times a day (TID) | INTRAVENOUS | Status: DC
  Administered 2019-04-23 – 2019-04-26 (×9): 670 mg via INTRAVENOUS
  Filled 2019-04-23 (×11): qty 13.4

## 2019-04-23 MED ORDER — LORAZEPAM 2 MG/ML IJ SOLN
2.0000 mg | INTRAMUSCULAR | Status: DC | PRN
  Administered 2019-04-23: 2 mg via INTRAVENOUS
  Administered 2019-04-23: 3 mg via INTRAVENOUS
  Administered 2019-04-23 (×3): 2 mg via INTRAVENOUS
  Administered 2019-04-23: 3 mg via INTRAVENOUS
  Administered 2019-04-24 (×4): 2 mg via INTRAVENOUS
  Filled 2019-04-23: qty 1
  Filled 2019-04-23 (×2): qty 2
  Filled 2019-04-23 (×7): qty 1

## 2019-04-23 MED ORDER — SODIUM CHLORIDE 0.9 % IV SOLN
2.0000 g | Freq: Two times a day (BID) | INTRAVENOUS | Status: DC
  Administered 2019-04-23 – 2019-04-25 (×4): 2 g via INTRAVENOUS
  Filled 2019-04-23: qty 20
  Filled 2019-04-23 (×2): qty 2
  Filled 2019-04-23 (×2): qty 20
  Filled 2019-04-23: qty 2

## 2019-04-23 MED ORDER — LORAZEPAM 2 MG/ML IJ SOLN
1.0000 mg | Freq: Three times a day (TID) | INTRAMUSCULAR | Status: DC
  Administered 2019-04-23 – 2019-04-24 (×3): 1 mg via INTRAVENOUS
  Filled 2019-04-23 (×3): qty 1

## 2019-04-23 MED ORDER — SODIUM PHOSPHATES 45 MMOLE/15ML IV SOLN
30.0000 mmol | Freq: Once | INTRAVENOUS | Status: AC
  Administered 2019-04-23: 30 mmol via INTRAVENOUS
  Filled 2019-04-23: qty 10

## 2019-04-23 MED ORDER — VANCOMYCIN HCL IN DEXTROSE 1-5 GM/200ML-% IV SOLN
1000.0000 mg | Freq: Three times a day (TID) | INTRAVENOUS | Status: DC
  Administered 2019-04-23 – 2019-04-25 (×5): 1000 mg via INTRAVENOUS
  Filled 2019-04-23 (×6): qty 200

## 2019-04-23 MED ORDER — VANCOMYCIN HCL 10 G IV SOLR
1750.0000 mg | Freq: Once | INTRAVENOUS | Status: AC
  Administered 2019-04-23: 1750 mg via INTRAVENOUS
  Filled 2019-04-23: qty 1750

## 2019-04-23 MED ORDER — ACETAMINOPHEN 10 MG/ML IV SOLN
1000.0000 mg | Freq: Four times a day (QID) | INTRAVENOUS | Status: DC | PRN
  Administered 2019-04-23: 1000 mg via INTRAVENOUS
  Filled 2019-04-23: qty 100

## 2019-04-23 MED ORDER — THIAMINE HCL 100 MG/ML IJ SOLN
500.0000 mg | Freq: Three times a day (TID) | INTRAVENOUS | Status: AC
  Administered 2019-04-23 – 2019-04-26 (×9): 500 mg via INTRAVENOUS
  Filled 2019-04-23 (×10): qty 5

## 2019-04-23 NOTE — Progress Notes (Addendum)
Neurology Progress Note   S:// Patient seen and examined.  Remains very confused agitated and aggressive Remains on COVID isolation despite 2- tests.  Her test has been ordered.  Strong suspicion for COVID pneumonia based on ancillary markers.  O:// Current vital signs: BP 130/89 (BP Location: Right Arm)   Pulse 88   Temp 99.2 F (37.3 C) (Axillary)   Resp 18   Ht 5\' 4"  (1.626 m)   Wt 85.7 kg   SpO2 95%   BMI 32.43 kg/m  Vital signs in last 24 hours: Temp:  [98.4 F (36.9 C)-101.5 F (38.6 C)] 99.2 F (37.3 C) (07/15 1210) Pulse Rate:  [84-102] 88 (07/15 1210) Resp:  [16-20] 18 (07/15 1210) BP: (109-141)/(51-89) 130/89 (07/15 1210) SpO2:  [90 %-96 %] 95 % (07/15 1210) Weight:  [85.7 kg] 85.7 kg (07/15 0346) General: Drowsy does not open eyes to voice.  Does not follow commands. HEENT: Normocephalic atraumatic dry mucous membranes Lungs: Clear to auscultation Cardiovascular: Respiratory regular rhythm Neurological exam Drowsy, does not open voice, does not follow commands. Actively resists any efforts to open her eyes or examine her limbs. No spontaneous movement I am trying to examine her, she starts getting agitated and starts throwing her arms and legs in the air-symmetrically, no focal weakness noted. Strong withdrawal to noxious stimulation in all 4 extremities.  Medications  Current Facility-Administered Medications:  .  acetaminophen (OFIRMEV) IV 1,000 mg, 1,000 mg, Intravenous, Q6H PRN, Regalado, Belkys A, MD .  acetaminophen (TYLENOL) tablet 650 mg, 650 mg, Oral, Q6H PRN, Jonah BlueYates, Jennifer, MD .  albuterol (VENTOLIN HFA) 108 (90 Base) MCG/ACT inhaler 2 puff, 2 puff, Inhalation, Q6H PRN, Jonah BlueYates, Jennifer, MD .  azithromycin (ZITHROMAX) 500 mg in sodium chloride 0.9 % 250 mL IVPB, 500 mg, Intravenous, Q24H, Jonah BlueYates, Jennifer, MD, Stopped at 04/22/19 2344 .  benztropine (COGENTIN) tablet 0.5 mg, 0.5 mg, Oral, QHS, Jonah BlueYates, Jennifer, MD .  bisacodyl (DULCOLAX) EC tablet 5  mg, 5 mg, Oral, Daily PRN, Jonah BlueYates, Jennifer, MD .  cefTRIAXone (ROCEPHIN) 2 g in sodium chloride 0.9 % 100 mL IVPB, 2 g, Intravenous, Q24H, Jonah BlueYates, Jennifer, MD, Stopped at 04/22/19 2244 .  dextrose 5 % 900 mL with potassium chloride 40 mEq, sodium bicarbonate 100 mEq infusion, , Intravenous, Continuous, Regalado, Belkys A, MD, Last Rate: 100 mL/hr at 04/23/19 0847 .  docusate sodium (COLACE) capsule 100 mg, 100 mg, Oral, BID, Jonah BlueYates, Jennifer, MD .  doxepin (SINEQUAN) capsule 75 mg, 75 mg, Oral, QHS PRN, Jonah BlueYates, Jennifer, MD .  gabapentin (NEURONTIN) capsule 400 mg, 400 mg, Oral, TID WC & HS, Jonah BlueYates, Jennifer, MD .  levothyroxine (SYNTHROID) tablet 50 mcg, 50 mcg, Oral, Daily, Jonah BlueYates, Jennifer, MD .  LORazepam (ATIVAN) injection 1 mg, 1 mg, Intravenous, Q8H, Regalado, Belkys A, MD, 1 mg at 04/23/19 1233 .  LORazepam (ATIVAN) injection 2-3 mg, 2-3 mg, Intravenous, Q1H PRN, 2 mg at 04/23/19 1334 **OR** LORazepam (ATIVAN) injection 2-3 mg, 2-3 mg, Intramuscular, Q1H PRN, Jonah BlueYates, Jennifer, MD .  ondansetron (ZOFRAN) tablet 4 mg, 4 mg, Oral, Q6H PRN **OR** ondansetron (ZOFRAN) injection 4 mg, 4 mg, Intravenous, Q6H PRN, Jonah BlueYates, Jennifer, MD .  pantoprazole (PROTONIX) EC tablet 40 mg, 40 mg, Oral, Daily, Jonah BlueYates, Jennifer, MD .  polyethylene glycol (MIRALAX / GLYCOLAX) packet 17 g, 17 g, Oral, Daily PRN, Jonah BlueYates, Jennifer, MD .  potassium chloride 20 MEQ/15ML (10%) solution 40 mEq, 40 mEq, Oral, Once, Jonah BlueYates, Jennifer, MD .  sertraline (ZOLOFT) tablet 100 mg, 100 mg, Oral, QPC  breakfast, Jonah BlueYates, Jennifer, MD .  sodium chloride flush (NS) 0.9 % injection 10-40 mL, 10-40 mL, Intracatheter, Q12H, Jonah BlueYates, Jennifer, MD, 10 mL at 04/23/19 0844 .  sodium chloride flush (NS) 0.9 % injection 10-40 mL, 10-40 mL, Intracatheter, PRN, Jonah BlueYates, Jennifer, MD, 10 mL at 04/23/19 0844 .  sodium chloride flush (NS) 0.9 % injection 3 mL, 3 mL, Intravenous, Q12H, Jonah BlueYates, Jennifer, MD, 3 mL at 04/23/19 0844 .  sodium phosphate 30 mmol in  dextrose 5 % 250 mL infusion, 30 mmol, Intravenous, Once, Regalado, Belkys A, MD, Last Rate: 43 mL/hr at 04/23/19 0914, 30 mmol at 04/23/19 0914 .  thiamine 500mg  in normal saline (50ml) IVPB, 500 mg, Intravenous, TID, Caryl PinaLindzen, Eric, MD, Last Rate: 100 mL/hr at 04/23/19 0911, 500 mg at 04/23/19 0911 Labs CBC    Component Value Date/Time   WBC 9.3 04/23/2019 0357   RBC 3.17 (L) 04/23/2019 0357   HGB 9.3 (L) 04/23/2019 0357   HCT 28.5 (L) 04/23/2019 0357   PLT 219 04/23/2019 0357   MCV 89.9 04/23/2019 0357   MCH 29.3 04/23/2019 0357   MCHC 32.6 04/23/2019 0357   RDW 16.7 (H) 04/23/2019 0357   LYMPHSABS 1.3 04/23/2019 0357   MONOABS 1.1 (H) 04/23/2019 0357   EOSABS 0.0 04/23/2019 0357   BASOSABS 0.0 04/23/2019 0357    CMP     Component Value Date/Time   NA 142 04/23/2019 0357   K 3.5 04/23/2019 0357   CL 105 04/23/2019 0357   CO2 27 04/23/2019 0357   GLUCOSE 193 (H) 04/23/2019 0357   BUN <5 (L) 04/23/2019 0357   CREATININE 0.58 04/23/2019 0357   CALCIUM 8.1 (L) 04/23/2019 0357   PROT 5.6 (L) 04/23/2019 0357   ALBUMIN 2.8 (L) 04/23/2019 0357   AST 49 (H) 04/23/2019 0357   ALT 22 04/23/2019 0357   ALKPHOS 71 04/23/2019 0357   BILITOT 0.4 04/23/2019 0357   GFRNONAA >60 04/23/2019 0357   GFRAA >60 04/23/2019 0357     Imaging I have reviewed images in epic and the results pertinent to this consultation are: CT head no acute changes. CT C-spine no acute changes no fractures  Assessment: 53 year old woman with 1 week history of gait unsteadiness, and worsening confusion, currently and covered isolation for possible false negative coronavirus infection with 2 tests negative and third 1 pending. She appears to be in agitated delirium as already documented by Dr. Otelia LimesLindzen. Differentials to consider are substance/EtOH withdrawal, hypoxemia, infectious encephalopathy. Subclinical seizures could also be in the differentials but patient did not permit to obtain CTA and did not  cooperate with any studies that have been attempted so far. She is on standing doses of benzodiazepines which are being given to her. She is pending her third COVID test. I am still not very clear on the etiology of her current presentation and would like to get some more history from the family.  A CNS infection should also be considered.  She was on Lovenox-full dose this morning, and cannot perform spinal tap.  Started on empiric meningitic and encephalitic coverage. Will attempt to perform a spinal tap tomorrow.  Hold Lovenox for 24 hours.  Recommendations: CIWA EEG MRI Meningitic and HSV encephalitis covergae. LP tomorrow - hold Lovenox for 24h  Plan relayed to Dr. Sunnie Nielsenegalado -- Milon DikesAshish Kailan Laws, MD [facility] Pager: [phone] If 7pm to 7am, please call on call as listed on AMION.   Addendum Spoke with next of [place]-Barbara Pruitt, who is a friend. According to Ms.  Pruitt, the patient used to live in [place] and at that time was dependent on a lot of medications whenever she could get a handle on-Ambien Xanax etc.  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  She does not social distance, does not wear mask and takes money from a lot of people while panhandling. She has a significant history of PTSD and bipolar disorder.  The friend could not tell me what kind of medications she is on. She did tell me that she has been complaining of being in a fog and taking a lot of BC powder over the last 2 days, when she took 2 full boxes of BC powder. I will continue to follow her. I will attempt to do a spinal tap tomorrow but she is too agitated and I feel that sedating her more would only worsen her neurological exam that we have. It might be important though to sedate her and maybe even intubate her and then do some tests including spinal tap, MRI and EEG- it has benefits and risks both and we will reassess  tomorrow.  -- Amie Portland, MD [facility] Pager: [phone] If 7pm to 7am, please call on call as listed on AMION.

## 2019-04-23 NOTE — Progress Notes (Signed)
PROGRESS NOTE    Mary Dodson  CNO:[id number] DOB: 1965-12-19 DOA: 04/21/2019 PCP: [name], NP   Brief Narrative: 53 year old with past medical history significant for PTSD, bipolar disorder, ethanol dependence, hypothyroidism who presents with altered mental status.  Patient was seen by neurology on 6/24 episodic headache with confusion delirium, possible due to complicated migraine versus atypical seizure.  She had a normal awake and asleep EEG on 6/17.  EMS was called yesterday evening by her friend due to concern for recurrent fall, altered mental status.  She was seen in the ED and the patient was alert and no to answer questions in a belligerent and nonsensical manner.  There was concern for sepsis and she was leaving 1 L of IV fluids.  Patient was a started on ceftriaxone and azithromycin.  She was found to have an elevated salicylic acid level and poison control was contacted.  They recommended calling nephrology for dialysis.  She was a started on a bicarb drip.  Chest x-ray showed bilateral pneumonia.  Patient required Ativan and Geodon due to periodic severe agitation.  At the time patient was evaluated by admitting her patient was not able to answer questions and had nonsensical speech. Per [facility]K, patient has been complaining of severe headache for the last few days.  She was complaining of feeling like if in her head.  Patient has been in contact with homeless people, does not follow social distance guideline.  Patient was evaluated by nephrology for salicylate toxicity, the decision was to treated conservatively.  On bicarb drip.  COVID x2- Assessment & Plan:   Principal Problem:   Acute encephalopathy Active Problems:   Alcohol use disorder, severe, dependence (HCC)   Bipolar disorder, current episode manic severe with psychotic features ([place])   Hypothyroidism (acquired)   Acute on chronic respiratory failure with hypoxia (HCC)   Salicylate poisoning   1-Acute  metabolic encephalopathy: Awaiting EEG G and MRI. Considering COVID encephalitis.  Repeated call with test. Discussed with neurology, patient received Lovenox might be able to perform LP tomorrow.  Patient will be started on acyclovir to cover HSV encephalitis We will schedule IV Ativan as recommended by neurology. Continue with CIWA protocol.  On high-dose IV thiamine for 3 days.  Acute on chronic hypoxic respiratory failure: Patient presented with fever, CRP ESR elevated. I will continue for now with ceftriaxone and azithromycin. Call with test x2-.  Highly concern for cough with.  I will repeat call with test. Supportive care. Oxygen saturation has been remaining stable this morning.  ASA toxicity: Probably related to taking BC powders for headache. Salicylate level on admission 41, now within normal limit. Was treated with IV fluids bicarb drip. Evaluated by nephrology who recommended support care no need for dialysis.  ETOH dependence: Unclear last alcohol intake. Continue with CIWA protocol.  Bipolar: Resume meds when able to take oral On gabapentin, hold Zoloft in case of serotonin syndrome  Hypophosphatemia: Replete IV   Estimated body mass index is 32.43 kg/m as calculated from the following:   Height as of this encounter: '5\' 4"'  (1.626 m).   Weight as of this encounter: 85.7 kg.   DVT prophylaxis: Continue Lovenox in case patient require LP Code Status: Full code Family Communication: Disposition Plan: Remain in the hospital for treatment of metabolic encephalopathy Consultants:   Neurology  Procedures:  None Antimicrobials:  Ceftriaxone and azithromycin  Subjective: Patient is agitated, constantly removing her leads and trying to get out of the bed.  She  is not able to speak clearly  Objective: Vitals:   04/23/19 0334 04/23/19 0335 04/23/19 0346 04/23/19 0749  BP: 118/69 118/69  (!) 141/65  Pulse: (!) 102 88  84  Resp:    16  Temp:  98.5 F (36.9  C)  (!) 101.5 F (38.6 C)  TempSrc:  Axillary  Axillary  SpO2:  95%  90%  Weight:   85.7 kg   Height:        Intake/Output Summary (Last 24 hours) at 04/23/2019 1035 Last data filed at 04/23/2019 0301 Gross per 24 hour  Intake 1799.19 ml  Output 300 ml  Net 1499.19 ml   Filed Weights   04/22/19 1147 04/23/19 0346  Weight: 86.6 kg 85.7 kg    Examination:  General exam: Agitated, bilateral ecchymosis periorbital Respiratory system: Clear to auscultation. Respiratory effort normal. Cardiovascular system: S1 & S2 heard, RRR. No JVD, murmurs, rubs, gallops or clicks. No pedal edema. Gastrointestinal system: Abdomen is nondistended, soft and nontender. No organomegaly or masses felt. Normal bowel sounds heard. Central nervous system: Agitated, not following commands, unable to express herself Extremities: Symmetric 5 x 5 power. Skin: No rashes, lesions or ulcers   Data Reviewed: I have personally reviewed following labs and imaging studies  CBC: Recent Labs  Lab 04/21/19 2119 04/22/19 0011 04/23/19 0357  WBC 9.4  --  9.3  NEUTROABS 7.1  --  6.8  HGB 10.9* 11.2* 9.3*  HCT 33.4* 33.0* 28.5*  MCV 89.8  --  89.9  PLT 264  --  975   Basic Metabolic Panel: Recent Labs  Lab 04/21/19 2119 04/22/19 0011 04/22/19 0104 04/22/19 0744 04/23/19 0357  NA 141 142 140 144 142  K 3.4* 3.7 3.2* 3.5 3.5  CL 108  --  111 114* 105  CO2 19*  --  17* 20* 27  GLUCOSE 109*  --  105* 106* 193*  BUN 15  --  14 12 <5*  CREATININE 0.93  --  0.79 0.66 0.58  CALCIUM 8.2*  --  7.5* 7.4* 8.1*  MG  --   --   --   --  1.7  PHOS  --   --   --  2.2* 1.4*   GFR: Estimated Creatinine Clearance: 87.1 mL/min (by C-G formula based on SCr of 0.58 mg/dL). Liver Function Tests: Recent Labs  Lab 04/21/19 2119 04/22/19 0744 04/23/19 0357  AST 117*  --  49*  ALT 22  --  22  ALKPHOS 73  --  71  BILITOT 0.6  --  0.4  PROT 6.3*  --  5.6*  ALBUMIN 3.4* 2.7* 2.8*   No results for input(s): LIPASE,  AMYLASE in the last 168 hours. Recent Labs  Lab 04/21/19 2121 04/22/19 1908  AMMONIA 26 25   Coagulation Profile: Recent Labs  Lab 04/22/19 0104  INR 1.6*   Cardiac Enzymes: No results for input(s): CKTOTAL, CKMB, CKMBINDEX, TROPONINI in the last 168 hours. BNP (last 3 results) No results for input(s): PROBNP in the last 8760 hours. HbA1C: No results for input(s): HGBA1C in the last 72 hours. CBG: Recent Labs  Lab 04/21/19 2202  GLUCAP 103*   Lipid Profile: No results for input(s): CHOL, HDL, LDLCALC, TRIG, CHOLHDL, LDLDIRECT in the last 72 hours. Thyroid Function Tests: Recent Labs    04/21/19 2119 04/22/19 0104  TSH 0.210*  --   FREET4  --  0.42*   Anemia Panel: Recent Labs    04/22/19 0104 04/23/19 0357  FERRITIN  19 39   Sepsis Labs: Recent Labs  Lab 04/21/19 2145 04/22/19 0104  PROCALCITON  --  <0.10  LATICACIDVEN 0.8 0.7    Recent Results (from the past 240 hour(s))  Culture, blood (routine x 2)     Status: None (Preliminary result)   Collection Time: 04/21/19  9:45 PM   Specimen: BLOOD  Result Value Ref Range Status   Specimen Description BLOOD SITE NOT SPECIFIED  Final   Special Requests   Final    BOTTLES DRAWN AEROBIC AND ANAEROBIC Blood Culture adequate volume   Culture   Final    NO GROWTH < 24 HOURS Performed at [facility], [place] [address], [place], [place] [address]    Report Status PENDING  Incomplete  SARS Coronavirus 2 (CEPHEID- Performed in [place] hospital lab), Hosp Order     Status: None   Collection Time: 04/21/19 10:55 PM   Specimen: Nasopharyngeal Swab  Result Value Ref Range Status   SARS Coronavirus 2 NEGATIVE NEGATIVE Final    Comment: (NOTE) If result is NEGATIVE SARS-CoV-2 target nucleic acids are NOT DETECTED. The SARS-CoV-2 RNA is generally detectable in upper and lower  respiratory specimens during the acute phase of infection. The lowest  concentration of SARS-CoV-2 viral copies this assay can detect  is 250  copies / mL. A negative result does not preclude SARS-CoV-2 infection  and should not be used as the sole basis for treatment or other  patient management decisions.  A negative result may occur with  improper specimen collection / handling, submission of specimen other  than nasopharyngeal swab, presence of viral mutation(s) within the  areas targeted by this assay, and inadequate number of viral copies  (<250 copies / mL). A negative result must be combined with clinical  observations, patient history, and epidemiological information. If result is POSITIVE SARS-CoV-2 target nucleic acids are DETECTED. The SARS-CoV-2 RNA is generally detectable in upper and lower  respiratory specimens dur ing the acute phase of infection.  Positive  results are indicative of active infection with SARS-CoV-2.  Clinical  correlation with patient history and other diagnostic information is  necessary to determine patient infection status.  Positive results do  not rule out bacterial infection or co-infection with other viruses. If result is PRESUMPTIVE POSTIVE SARS-CoV-2 nucleic acids MAY BE PRESENT.   A presumptive positive result was obtained on the submitted specimen  and confirmed on repeat testing.  While 2019 novel coronavirus  (SARS-CoV-2) nucleic acids may be present in the submitted sample  additional confirmatory testing may be necessary for epidemiological  and / or clinical management purposes  to differentiate between  SARS-CoV-2 and other Sarbecovirus currently known to infect humans.  If clinically indicated additional testing with an alternate test  methodology [phone]) is advised. The SARS-CoV-2 RNA is generally  detectable in upper and lower respiratory sp ecimens during the acute  phase of infection. The expected result is Negative. Fact Sheet for Patients:  StrictlyIdeas.no Fact Sheet for Healthcare Providers:  BankingDealers.co.za This test is not yet approved or cleared by the [place] FDA and has been authorized for detection and/or diagnosis of SARS-CoV-2 by FDA under an Emergency Use Authorization (EUA).  This EUA will remain in effect (meaning this test can be used) for the duration of the COVID-19 declaration under Section 564(b)(1) of the Act, 21 U.S.C. section 360bbb-3(b)(1), unless the authorization is terminated or revoked sooner. Performed at [facility], [place] [address], [place], [place] [address]  SARS Coronavirus 2 (CEPHEID- Performed in [place] hospital lab), Hosp Order     Status: None   Collection Time: 04/22/19  7:38 AM   Specimen: Nasopharyngeal Swab  Result Value Ref Range Status   SARS Coronavirus 2 NEGATIVE NEGATIVE Final    Comment: (NOTE) If result is NEGATIVE SARS-CoV-2 target nucleic acids are NOT DETECTED. The SARS-CoV-2 RNA is generally detectable in upper and lower  respiratory specimens during the acute phase of infection. The lowest  concentration of SARS-CoV-2 viral copies this assay can detect is 250  copies / mL. A negative result does not preclude SARS-CoV-2 infection  and should not be used as the sole basis for treatment or other  patient management decisions.  A negative result may occur with  improper specimen collection / handling, submission of specimen other  than nasopharyngeal swab, presence of viral mutation(s) within the  areas targeted by this assay, and inadequate number of viral copies  (<250 copies / mL). A negative result must be combined with clinical  observations, patient history, and epidemiological information. If result is POSITIVE SARS-CoV-2 target nucleic acids are DETECTED. The SARS-CoV-2 RNA is generally detectable in upper and lower  respiratory specimens dur ing the acute phase of infection.  Positive  results are indicative of active infection with SARS-CoV-2.  Clinical  correlation  with patient history and other diagnostic information is  necessary to determine patient infection status.  Positive results do  not rule out bacterial infection or co-infection with other viruses. If result is PRESUMPTIVE POSTIVE SARS-CoV-2 nucleic acids MAY BE PRESENT.   A presumptive positive result was obtained on the submitted specimen  and confirmed on repeat testing.  While 2019 novel coronavirus  (SARS-CoV-2) nucleic acids may be present in the submitted sample  additional confirmatory testing may be necessary for epidemiological  and / or clinical management purposes  to differentiate between  SARS-CoV-2 and other Sarbecovirus currently known to infect humans.  If clinically indicated additional testing with an alternate test  methodology [phone]) is advised. The SARS-CoV-2 RNA is generally  detectable in upper and lower respiratory sp ecimens during the acute  phase of infection. The expected result is Negative. Fact Sheet for Patients:  StrictlyIdeas.no Fact Sheet for Healthcare Providers: BankingDealers.co.za This test is not yet approved or cleared by the [place] FDA and has been authorized for detection and/or diagnosis of SARS-CoV-2 by FDA under an Emergency Use Authorization (EUA).  This EUA will remain in effect (meaning this test can be used) for the duration of the COVID-19 declaration under Section 564(b)(1) of the Act, 21 U.S.C. section 360bbb-3(b)(1), unless the authorization is terminated or revoked sooner. Performed at [facility], [place] [address], [place], [place] [address]   MRSA PCR Screening     Status: None   Collection Time: 04/22/19 11:50 AM   Specimen: Nasal Mucosa; Nasopharyngeal  Result Value Ref Range Status   MRSA by PCR NEGATIVE NEGATIVE Final    Comment:        The GeneXpert MRSA Assay (FDA approved for NASAL specimens only), is one component of a comprehensive MRSA colonization  surveillance program. It is not intended to diagnose MRSA infection nor to guide or monitor treatment for MRSA infections. Performed at [facility], [place] [address], [place], [place] [address]          Radiology Studies: Ct Head Wo Contrast  Result Date: 04/22/2019 CLINICAL DATA:  Altered mental status.  History of alcohol abuse EXAM:  CT HEAD WITHOUT CONTRAST CT CERVICAL SPINE WITHOUT CONTRAST TECHNIQUE: Multidetector CT imaging of the head and cervical spine was performed following the standard protocol without intravenous contrast. Multiplanar CT image reconstructions of the cervical spine were also generated. COMPARISON:  10/01/2018 FINDINGS: CT HEAD FINDINGS Brain: No evidence of acute infarction, hemorrhage, hydrocephalus, extra-axial collection or mass lesion/mass effect. Vascular: No hyperdense vessel or unexpected calcification. Skull: Normal. Negative for fracture or focal lesion. Sinuses/Orbits: No acute finding. CT CERVICAL SPINE FINDINGS Alignment: When allowing for positioning there is no malalignment. Skull base and vertebrae: Negative for fracture or bone lesion Soft tissues and spinal canal: No prevertebral fluid or swelling. No visible canal hematoma. Postoperative lateral left neck. Disc levels:  C6-7 disc degeneration. Upper chest: Minimal ground-glass opacity in the right apical lung. Chest x-ray was performed a few hours earlier and reported similar findings. IMPRESSION: No acute intracranial or cervical spine finding. Electronically Signed   By: Monte Fantasia M.D.   On: 04/22/2019 05:24   Ct Cervical Spine Wo Contrast  Result Date: 04/22/2019 CLINICAL DATA:  Altered mental status.  History of alcohol abuse EXAM: CT HEAD WITHOUT CONTRAST CT CERVICAL SPINE WITHOUT CONTRAST TECHNIQUE: Multidetector CT imaging of the head and cervical spine was performed following the standard protocol without intravenous contrast. Multiplanar CT image reconstructions of the cervical  spine were also generated. COMPARISON:  10/01/2018 FINDINGS: CT HEAD FINDINGS Brain: No evidence of acute infarction, hemorrhage, hydrocephalus, extra-axial collection or mass lesion/mass effect. Vascular: No hyperdense vessel or unexpected calcification. Skull: Normal. Negative for fracture or focal lesion. Sinuses/Orbits: No acute finding. CT CERVICAL SPINE FINDINGS Alignment: When allowing for positioning there is no malalignment. Skull base and vertebrae: Negative for fracture or bone lesion Soft tissues and spinal canal: No prevertebral fluid or swelling. No visible canal hematoma. Postoperative lateral left neck. Disc levels:  C6-7 disc degeneration. Upper chest: Minimal ground-glass opacity in the right apical lung. Chest x-ray was performed a few hours earlier and reported similar findings. IMPRESSION: No acute intracranial or cervical spine finding. Electronically Signed   By: Monte Fantasia M.D.   On: 04/22/2019 05:24   Dg Chest Portable 1 View  Result Date: 04/21/2019 CLINICAL DATA:  53 year old female with multiple falls, fever, altered mental status. EXAM: PORTABLE CHEST 1 VIEW COMPARISON:  Portable chest 10/01/2016 and earlier. FINDINGS: Portable AP semi upright view at 2145 hours. Lower lung volumes with patchy and indistinct bilateral multifocal pulmonary opacity. Mildly lower lung volumes. Mediastinal contours remain normal. Visualized tracheal air column is within normal limits. No pneumothorax or pleural effusion. Negative visible bowel gas pattern. No acute osseous abnormality identified. IMPRESSION: Patchy and indistinct bilateral pulmonary opacity suspicious for bilateral pneumonia. Consider viral/atypical etiology. No pleural effusion. Electronically Signed   By: Genevie Ann M.D.   On: 04/21/2019 21:58        Scheduled Meds: . benztropine  0.5 mg Oral QHS  . docusate sodium  100 mg Oral BID  . enoxaparin (LOVENOX) injection  40 mg Subcutaneous Q24H  . gabapentin  400 mg Oral TID WC  & HS  . levothyroxine  50 mcg Oral Daily  . LORazepam  1 mg Intravenous Q8H  . pantoprazole  40 mg Oral Daily  . potassium chloride  40 mEq Oral Once  . sertraline  100 mg Oral QPC breakfast  . sodium chloride flush  10-40 mL Intracatheter Q12H  . sodium chloride flush  3 mL Intravenous Q12H   Continuous Infusions: . acetaminophen    .  azithromycin Stopped (04/22/19 2344)  . cefTRIAXone (ROCEPHIN)  IV Stopped (04/22/19 2244)  . dextrose 5 % 900 mL with potassium chloride 40 mEq, sodium bicarbonate 100 mEq infusion 100 mL/hr at 04/23/19 0847  . sodium phosphate  Dextrose 5% IVPB 30 mmol (04/23/19 0914)  . thiamine injection 500 mg (04/23/19 0911)     LOS: 1 day    Time spent: 35 minutes    Elmarie Shiley, MD [facility] Pager [phone]  If 7PM-7AM, please contact night-coverage [email or web address] Password [facility] 04/23/2019, 10:35 AM

## 2019-04-23 NOTE — Progress Notes (Signed)
Will attempt EEG tomorrow when schedule permits. Pt is noted to be combative and uncooperative per nursing staff. Madaline Brilliant with Neuro

## 2019-04-23 NOTE — Progress Notes (Addendum)
Pharmacy Antibiotic Note  Mary Dodson is a 53 y.o. female admitted on 04/21/2019 with acute encephalopathy. Pharmacy has been consulted for antibiotic dosing for bacterial/viral meningitis (does not need Listeria coverage, per consult note). Patient is being treated for CAP with ceftriaxone and azithromycin. COVID encephalitis is also under consideration (rapid test negative).  Medical history includes: PTSD, bipolar disorder, ethanol dependence, hypothyroidism, salicylate poisoning, acute on chronic respiratory failure.  Renal function stable; current CrCl ~87 mL/min; CRP 18.3; planning LP for tomorrow if able (pt rec'd Lovenox)  Plan: Increase ceftriaxone dose to 2 gm IV Q 12 hours Vancomycin 1750 mg IV X 1, followed by 1000 mg IV Q 8 hrs, per [facility] nomogram, targeting vancomycin trough of 15-20 mg/L Acyclovir 670 mg IV Q 8 hrs (using adjusted body wt) Monitor vancomycin trough level prior to 5th dose of vancomycin, given indication Monitor renal function, WBC, LP results, cx  Height: 5\' 4"  (162.6 cm) Weight: 188 lb 15 oz (85.7 kg) IBW/kg (Calculated) : 54.7  Temp (24hrs), Avg:99.1 F (37.3 C), Min:98.4 F (36.9 C), Max:101.5 F (38.6 C)  Recent Labs  Lab 04/21/19 2119 04/21/19 2145 04/22/19 0104 04/22/19 0744 04/23/19 0357  WBC 9.4  --   --   --  9.3  CREATININE 0.93  --  0.79 0.66 0.58  LATICACIDVEN  --  0.8 0.7  --   --     Estimated Creatinine Clearance: 87.1 mL/min (by C-G formula based on SCr of 0.58 mg/dL).    Allergies  Allergen Reactions  . Morphine And Related Other (See Comments)    "Makes me crazy"  . Morphine And Related Other (See Comments)    "It is bad"  . Morphine And Related   . Trazodone And Nefazodone Other (See Comments)    Antimicrobials this admission: 7/13 ceftriaxone >>  7/13 azithromycin >>   Microbiology results: 7/13 BCx X 1: NGTD 7/15 COVID: negative 7/14 Urine cx: in process  7/14 MRSA PCR: negative  Thank you for allowing  pharmacy to be a part of this patient's care.  Gillermina Hu, PharmD, BCPS, [facility] Clinical Pharmacist 04/23/2019 3:08 PM

## 2019-04-24 ENCOUNTER — Inpatient Hospital Stay (HOSPITAL_COMMUNITY): Payer: Self-pay

## 2019-04-24 ENCOUNTER — Inpatient Hospital Stay: Payer: Self-pay

## 2019-04-24 DIAGNOSIS — R4182 Altered mental status, unspecified: Secondary | ICD-10-CM

## 2019-04-24 DIAGNOSIS — J9601 Acute respiratory failure with hypoxia: Secondary | ICD-10-CM

## 2019-04-24 LAB — CBC WITH DIFFERENTIAL/PLATELET
Abs Immature Granulocytes: 0.04 10*3/uL (ref 0.00–0.07)
Basophils Absolute: 0 10*3/uL (ref 0.0–0.1)
Basophils Relative: 0 %
Eosinophils Absolute: 0 10*3/uL (ref 0.0–0.5)
Eosinophils Relative: 0 %
HCT: 28.7 % — ABNORMAL LOW (ref 36.0–46.0)
Hemoglobin: 9 g/dL — ABNORMAL LOW (ref 12.0–15.0)
Immature Granulocytes: 0 %
Lymphocytes Relative: 10 %
Lymphs Abs: 0.9 10*3/uL (ref 0.7–4.0)
MCH: 28.9 pg (ref 26.0–34.0)
MCHC: 31.4 g/dL (ref 30.0–36.0)
MCV: 92.3 fL (ref 80.0–100.0)
Monocytes Absolute: 1.4 10*3/uL — ABNORMAL HIGH (ref 0.1–1.0)
Monocytes Relative: 15 %
Neutro Abs: 6.9 10*3/uL (ref 1.7–7.7)
Neutrophils Relative %: 75 %
Platelets: 236 10*3/uL (ref 150–400)
RBC: 3.11 MIL/uL — ABNORMAL LOW (ref 3.87–5.11)
RDW: 16 % — ABNORMAL HIGH (ref 11.5–15.5)
WBC: 9.3 10*3/uL (ref 4.0–10.5)
nRBC: 0 % (ref 0.0–0.2)

## 2019-04-24 LAB — COMPREHENSIVE METABOLIC PANEL
ALT: 22 U/L (ref 0–44)
AST: 34 U/L (ref 15–41)
Albumin: 2.6 g/dL — ABNORMAL LOW (ref 3.5–5.0)
Alkaline Phosphatase: 68 U/L (ref 38–126)
Anion gap: 8 (ref 5–15)
BUN: <5 mg/dL — ABNORMAL LOW (ref 6–20)
CO2: 32 mmol/L (ref 22–32)
Calcium: 8 mg/dL — ABNORMAL LOW (ref 8.9–10.3)
Chloride: 105 mmol/L (ref 98–111)
Creatinine, Ser: 0.47 mg/dL (ref 0.44–1.00)
GFR calc Af Amer: >60 mL/min (ref >60)
GFR calc non Af Amer: >60 mL/min (ref >60)
Glucose, Bld: 136 mg/dL — ABNORMAL HIGH (ref 70–99)
Potassium: 3.7 mmol/L (ref 3.5–5.1)
Sodium: 145 mmol/L (ref 135–145)
Total Bilirubin: 0.2 mg/dL — ABNORMAL LOW (ref 0.3–1.2)
Total Protein: 5.2 g/dL — ABNORMAL LOW (ref 6.5–8.1)

## 2019-04-24 LAB — PROTEIN AND GLUCOSE, CSF
Glucose, CSF: 94 mg/dL — ABNORMAL HIGH (ref 40–70)
Total  Protein, CSF: 42 mg/dL (ref 15–45)

## 2019-04-24 LAB — POCT I-STAT 7, (LYTES, BLD GAS, ICA,H+H)
Acid-Base Excess: 10 mmol/L — ABNORMAL HIGH (ref 0.0–2.0)
Acid-Base Excess: 10 mmol/L — ABNORMAL HIGH (ref 0.0–2.0)
Bicarbonate: 37.3 mmol/L — ABNORMAL HIGH (ref 20.0–28.0)
Bicarbonate: 35 mmol/L — ABNORMAL HIGH (ref 20.0–28.0)
Calcium, Ion: 1.15 mmol/L (ref 1.15–1.40)
Calcium, Ion: 1.13 mmol/L — ABNORMAL LOW (ref 1.15–1.40)
HCT: 36 % (ref 36.0–46.0)
HCT: 26 % — ABNORMAL LOW (ref 36.0–46.0)
Hemoglobin: 12.2 g/dL (ref 12.0–15.0)
Hemoglobin: 8.8 g/dL — ABNORMAL LOW (ref 12.0–15.0)
O2 Saturation: 100 %
O2 Saturation: 94 %
Patient temperature: 98.4
Patient temperature: 99.1
Potassium: 3.3 mmol/L — ABNORMAL LOW (ref 3.5–5.1)
Potassium: 3.5 mmol/L (ref 3.5–5.1)
Sodium: 145 mmol/L (ref 135–145)
Sodium: 144 mmol/L (ref 135–145)
TCO2: 39 mmol/L — ABNORMAL HIGH (ref 22–32)
TCO2: 37 mmol/L — ABNORMAL HIGH (ref 22–32)
pCO2 arterial: 59.5 mmHg — ABNORMAL HIGH (ref 32.0–48.0)
pCO2 arterial: 50.9 mmHg — ABNORMAL HIGH (ref 32.0–48.0)
pH, Arterial: 7.405 (ref 7.350–7.450)
pH, Arterial: 7.447 (ref 7.350–7.450)
pO2, Arterial: 393 mmHg — ABNORMAL HIGH (ref 83.0–108.0)
pO2, Arterial: 70 mmHg — ABNORMAL LOW (ref 83.0–108.0)

## 2019-04-24 LAB — CSF CELL COUNT WITH DIFFERENTIAL
RBC Count, CSF: 1 /mm3 — ABNORMAL HIGH
WBC, CSF: 3 /mm3 (ref 0–5)

## 2019-04-24 LAB — CRYPTOCOCCAL ANTIGEN, CSF: Crypto Ag: NEGATIVE

## 2019-04-24 LAB — HCG, QUANTITATIVE, PREGNANCY: hCG, Beta Chain, Quant, S: <1 m[IU]/mL (ref <5)

## 2019-04-24 LAB — VANCOMYCIN, TROUGH: Vancomycin Tr: 17 ug/mL (ref 15–20)

## 2019-04-24 LAB — MAGNESIUM: Magnesium: 1.7 mg/dL (ref 1.7–2.4)

## 2019-04-24 LAB — PHOSPHORUS: Phosphorus: 2.5 mg/dL (ref 2.5–4.6)

## 2019-04-24 LAB — SALICYLATE LEVEL: Salicylate Lvl: <7 mg/dL (ref 2.8–30.0)

## 2019-04-24 MED ORDER — CHLORHEXIDINE GLUCONATE 0.12% ORAL RINSE (MEDLINE KIT)
15.0000 mL | Freq: Two times a day (BID) | OROMUCOSAL | Status: DC
  Administered 2019-04-24 – 2019-04-26 (×4): 15 mL via OROMUCOSAL

## 2019-04-24 MED ORDER — FENTANYL CITRATE (PF) 100 MCG/2ML IJ SOLN
100.0000 ug | Freq: Once | INTRAMUSCULAR | Status: AC
  Administered 2019-04-24: 100 ug via INTRAVENOUS

## 2019-04-24 MED ORDER — FENTANYL CITRATE (PF) 100 MCG/2ML IJ SOLN: 50.0000 ug | INTRAMUSCULAR | Status: DC | PRN

## 2019-04-24 MED ORDER — ACETAMINOPHEN 160 MG/5ML PO SOLN
650.0000 mg | Freq: Four times a day (QID) | ORAL | Status: DC | PRN
  Administered 2019-04-25 (×2): 650 mg
  Filled 2019-04-24 (×2): qty 20.3

## 2019-04-24 MED ORDER — SODIUM PHOSPHATES 45 MMOLE/15ML IV SOLN
30.0000 mmol | Freq: Once | INTRAVENOUS | Status: AC
  Administered 2019-04-24: 30 mmol via INTRAVENOUS
  Filled 2019-04-24: qty 10

## 2019-04-24 MED ORDER — HALOPERIDOL LACTATE 5 MG/ML IJ SOLN
2.0000 mg | Freq: Once | INTRAMUSCULAR | Status: AC
  Administered 2019-04-24: 2 mg via INTRAVENOUS
  Filled 2019-04-24: qty 1

## 2019-04-24 MED ORDER — FENTANYL BOLUS VIA INFUSION
50.0000 ug | INTRAVENOUS | Status: DC | PRN
  Filled 2019-04-24: qty 50

## 2019-04-24 MED ORDER — BENZTROPINE MESYLATE 1 MG PO TABS: 0.5000 mg | ORAL_TABLET | Freq: Every day | ORAL | Status: DC

## 2019-04-24 MED ORDER — MIDAZOLAM HCL 2 MG/2ML IJ SOLN
2.0000 mg | Freq: Once | INTRAMUSCULAR | Status: AC
  Administered 2019-04-24: 2 mg via INTRAVENOUS

## 2019-04-24 MED ORDER — GABAPENTIN 250 MG/5ML PO SOLN
400.0000 mg | Freq: Four times a day (QID) | ORAL | Status: DC
  Filled 2019-04-24 (×4): qty 8

## 2019-04-24 MED ORDER — ONDANSETRON HCL 4 MG PO TABS: 4.0000 mg | ORAL_TABLET | Freq: Four times a day (QID) | ORAL | Status: DC | PRN

## 2019-04-24 MED ORDER — SODIUM CHLORIDE 0.9% FLUSH
10.0000 mL | Freq: Two times a day (BID) | INTRAVENOUS | Status: DC
  Administered 2019-04-24: 20 mL
  Administered 2019-04-26: 10 mL
  Administered 2019-04-26: 40 mL
  Administered 2019-04-27: 10 mL
  Administered 2019-04-27: 30 mL
  Administered 2019-04-28: 10 mL

## 2019-04-24 MED ORDER — LEVOTHYROXINE SODIUM 50 MCG PO TABS
50.0000 ug | ORAL_TABLET | Freq: Every day | ORAL | Status: DC
  Administered 2019-04-25: 50 ug
  Filled 2019-04-24: qty 1

## 2019-04-24 MED ORDER — SODIUM CHLORIDE 0.9% FLUSH: 10.0000 mL | INTRAVENOUS | Status: DC | PRN

## 2019-04-24 MED ORDER — ORAL CARE MOUTH RINSE
15.0000 mL | OROMUCOSAL | Status: DC
  Administered 2019-04-24 – 2019-04-26 (×16): 15 mL via OROMUCOSAL

## 2019-04-24 MED ORDER — ETOMIDATE 2 MG/ML IV SOLN
20.0000 mg | Freq: Once | INTRAVENOUS | Status: AC
  Administered 2019-04-24: 20 mg via INTRAVENOUS

## 2019-04-24 MED ORDER — MIDAZOLAM HCL 2 MG/2ML IJ SOLN
INTRAMUSCULAR | Status: AC
  Filled 2019-04-24: qty 2

## 2019-04-24 MED ORDER — GABAPENTIN 300 MG/6ML PO SOLN
400.0000 mg | Freq: Four times a day (QID) | ORAL | Status: DC
  Administered 2019-04-25 – 2019-04-26 (×5): 400 mg
  Filled 2019-04-24 (×10): qty 8

## 2019-04-24 MED ORDER — CHLORHEXIDINE GLUCONATE CLOTH 2 % EX PADS
6.0000 | MEDICATED_PAD | Freq: Every day | CUTANEOUS | Status: DC
  Administered 2019-04-24 – 2019-04-27 (×3): 6 via TOPICAL

## 2019-04-24 MED ORDER — ROCURONIUM BROMIDE 50 MG/5ML IV SOLN
80.0000 mg | Freq: Once | INTRAVENOUS | Status: AC
  Administered 2019-04-24: 80 mg via INTRAVENOUS

## 2019-04-24 MED ORDER — MIDAZOLAM HCL 2 MG/2ML IJ SOLN
2.0000 mg | Freq: Once | INTRAMUSCULAR | Status: AC
  Administered 2019-04-24: 2 mg via INTRAVENOUS
  Filled 2019-04-24: qty 2

## 2019-04-24 MED ORDER — FENTANYL 2500MCG IN NS 250ML (10MCG/ML) PREMIX INFUSION
50.0000 ug/h | INTRAVENOUS | Status: DC
  Administered 2019-04-24: 100 ug/h via INTRAVENOUS
  Administered 2019-04-25: 120 ug/h via INTRAVENOUS
  Administered 2019-04-25: 175 ug/h via INTRAVENOUS
  Filled 2019-04-24 (×3): qty 250

## 2019-04-24 MED ORDER — GABAPENTIN 400 MG PO CAPS: 400.0000 mg | ORAL_CAPSULE | Freq: Three times a day (TID) | ORAL | Status: DC

## 2019-04-24 MED ORDER — SODIUM CHLORIDE 0.9 % IV BOLUS
1000.0000 mL | Freq: Once | INTRAVENOUS | Status: AC
  Administered 2019-04-24: 1000 mL via INTRAVENOUS

## 2019-04-24 MED ORDER — ROCURONIUM BROMIDE 50 MG/5ML IV SOLN
40.0000 mg | Freq: Once | INTRAVENOUS | Status: AC
  Administered 2019-04-24: 40 mg via INTRAVENOUS
  Filled 2019-04-24: qty 4

## 2019-04-24 MED ORDER — ALBUTEROL SULFATE (2.5 MG/3ML) 0.083% IN NEBU
5.0000 mg | INHALATION_SOLUTION | Freq: Once | RESPIRATORY_TRACT | Status: AC
  Administered 2019-04-24: 5 mg via RESPIRATORY_TRACT
  Filled 2019-04-24: qty 6

## 2019-04-24 MED ORDER — DOCUSATE SODIUM 50 MG/5ML PO LIQD: 100.0000 mg | Freq: Two times a day (BID) | ORAL | Status: DC | PRN

## 2019-04-24 MED ORDER — PANTOPRAZOLE SODIUM 40 MG PO PACK
40.0000 mg | PACK | Freq: Every day | ORAL | Status: DC
  Administered 2019-04-25: 40 mg
  Filled 2019-04-24: qty 20

## 2019-04-24 MED ORDER — FENTANYL CITRATE (PF) 100 MCG/2ML IJ SOLN
INTRAMUSCULAR | Status: AC
  Filled 2019-04-24: qty 2

## 2019-04-24 MED ORDER — PROPOFOL 1000 MG/100ML IV EMUL
0.0000 ug/kg/min | INTRAVENOUS | Status: DC
  Administered 2019-04-24: 20 ug/kg/min via INTRAVENOUS
  Administered 2019-04-24: 30 ug/kg/min via INTRAVENOUS
  Administered 2019-04-25: 20 ug/kg/min via INTRAVENOUS
  Administered 2019-04-26: 10 ug/kg/min via INTRAVENOUS
  Filled 2019-04-24 (×4): qty 100

## 2019-04-24 MED ORDER — FENTANYL CITRATE (PF) 100 MCG/2ML IJ SOLN: 50.0000 ug | Freq: Once | INTRAMUSCULAR | Status: DC

## 2019-04-24 MED ORDER — MIDAZOLAM HCL 2 MG/2ML IJ SOLN
INTRAMUSCULAR | Status: AC
  Filled 2019-04-24: qty 4

## 2019-04-24 MED ORDER — POLYETHYLENE GLYCOL 3350 17 G PO PACK: 17.0000 g | PACK | Freq: Every day | ORAL | Status: DC | PRN

## 2019-04-24 MED ORDER — GADOBUTROL 1 MMOL/ML IV SOLN
8.0000 mL | Freq: Once | INTRAVENOUS | Status: AC | PRN
  Administered 2019-04-24: 8 mL via INTRAVENOUS

## 2019-04-24 MED ORDER — PROPOFOL 1000 MG/100ML IV EMUL
INTRAVENOUS | Status: AC
  Filled 2019-04-24: qty 100

## 2019-04-24 MED ORDER — ONDANSETRON HCL 4 MG/2ML IJ SOLN: 4.0000 mg | Freq: Four times a day (QID) | INTRAMUSCULAR | Status: DC | PRN

## 2019-04-24 MED ORDER — BENZTROPINE MESYLATE 1 MG PO TABS
0.5000 mg | ORAL_TABLET | Freq: Every day | ORAL | Status: DC
  Administered 2019-04-25 (×2): 0.5 mg
  Filled 2019-04-24 (×2): qty 1

## 2019-04-24 NOTE — Progress Notes (Signed)
PROGRESS NOTE    Mary Dodson  HRC:[id number] DOB: [birth year] DOA: 04/21/2019 PCP: [name], NP   Brief Narrative: 53 year old with past medical history significant for PTSD, bipolar disorder, ethanol dependence, hypothyroidism who presents with altered mental status.  Patient was seen by neurology on 6/24 episodic headache with confusion delirium, possible due to complicated migraine versus atypical seizure.  She had a normal awake and asleep EEG on 6/17.  EMS was called yesterday evening by her friend due to concern for recurrent fall, altered mental status.  She was seen in the ED and the patient was alert and no to answer questions in a belligerent and nonsensical manner.  There was concern for sepsis and she was leaving 1 L of IV fluids.  Patient was a started on ceftriaxone and azithromycin.  She was found to have an elevated salicylic acid level and poison control was contacted.  They recommended calling nephrology for dialysis.  She was a started on a bicarb drip.  Chest x-ray showed bilateral pneumonia.  Patient required Ativan and Geodon due to periodic severe agitation.  At the time patient was evaluated by admitting her patient was not able to answer questions and had nonsensical speech. Per NOK, patient has been complaining of severe headache for the last few days.  She was complaining of feeling like if in her head.  Patient has been in contact with homeless people, does not follow social distance guideline.  Patient was evaluated by nephrology for salicylate toxicity, the decision was to treated conservatively.  On bicarb drip.  COVID x2- Assessment & Plan:   Principal Problem:   Acute encephalopathy Active Problems:   Alcohol use disorder, severe, dependence (HCC)   Bipolar disorder, current episode manic severe with psychotic features ([place])   Hypothyroidism (acquired)   Acute on chronic respiratory failure with hypoxia (HCC)   Salicylate poisoning   1-Acute  metabolic encephalopathy: Awaiting EEG G and MRI. Considering COVID encephalitis.  Repeated call with test. Discussed with neurology, patient received Lovenox might be able to perform LP tomorrow.  Patient will be started on acyclovir to cover HSV encephalitis We will schedule IV Ativan as recommended by neurology. Continue with CIWA protocol.  On high-dose IV thiamine for 3 days. CCM consulted, patient agitated, required MRI, LP. Still agitated on ativan.   Acute on chronic hypoxic respiratory failure: Patient presented with fever, CRP ESR elevated. I will continue for now with ceftriaxone and azithromycin. Call with test x2-.  Repeated COVID negative.  Supportive care. On 3 L oxygen this am, required higher level.  CCM consulted  ASA toxicity: Probably related to taking BC powders for headache. Salicylate level on admission 41, now within normal limit. Was treated with IV fluids bicarb drip. Evaluated by nephrology who recommended support care no need for dialysis.  ETOH dependence: Unclear last alcohol intake. Per NOK, patient has not been drinking al;cohol;/  Continue with CIWA protocol.  Bipolar: Resume meds when able to take oral On gabapentin, hold Zoloft in case of serotonin syndrome  Hypophosphatemia: Replete IV   Estimated body mass index is 33.72 kg/m as calculated from the following:   Height as of this encounter: _0  (1.626 m).   Weight as of this encounter: 89.1 kg.   DVT prophylaxis: Continue Lovenox in case patient require LP Code Status: Full code Family Communication: Disposition Plan: Remain in the hospital for treatment of metabolic encephalopathy Consultants:   Neurology  Procedures:  None Antimicrobials:  Ceftriaxone and azithromycin  Subjective:  Agitated, on 3 l oxygen.  Non verbal. Moaning.   Objective: Vitals:   04/24/19 0000 04/24/19 0400 04/24/19 0500 04/24/19 0800  BP: (!) 149/79 136/63  (!) 112/95  Pulse: (!) 126 (!) 29     Resp: (!) _0 Temp: 97.6 F (36.4 C) 99.4 F (37.4 C)  98.4 F (36.9 C)  TempSrc: Axillary Axillary  Axillary  SpO2: 91% 95%  96%  Weight:   89.1 kg   Height:        Intake/Output Summary (Last 24 hours) at 04/24/2019 0852 Last data filed at 04/24/2019 0555 Gross per 24 hour  Intake 3936.67 ml  Output 2400 ml  Net 1536.67 ml   Filed Weights   04/22/19 1147 04/23/19 0346 04/24/19 0500  Weight: 86.6 kg 85.7 kg 89.1 kg    Examination:  General exam: Agitated Respiratory system: Bilateral ronchus Cardiovascular system: S 1, S 2 RRR Gastrointestinal system: BS present, soft, nt Central nervous system: agitated, not following command Extremities: Symmetric power.  Skin: No rashes, lesions or ulcers   Data Reviewed: I have personally reviewed following labs and imaging studies  CBC: Recent Labs  Lab 04/21/19 2119 04/22/19 0011 04/23/19 0357  WBC 9.4  --  9.3  NEUTROABS 7.1  --  6.8  HGB 10.9* 11.2* 9.3*  HCT 33.4* 33.0* 28.5*  MCV 89.8  --  89.9  PLT 264  --  370   Basic Metabolic Panel: Recent Labs  Lab 04/21/19 2119 04/22/19 0011 04/22/19 0104 04/22/19 0744 04/23/19 0357 04/23/19 2022  NA 141 142 140 144 142  --   K 3.4* 3.7 3.2* 3.5 3.5  --   CL 108  --  111 114* 105  --   CO2 19*  --  17* 20* 27  --   GLUCOSE 109*  --  105* 106* 193*  --   BUN 15  --  14 12 <5*  --   CREATININE 0.93  --  0.79 0.66 0.58  --   CALCIUM 8.2*  --  7.5* 7.4* 8.1*  --   MG  --   --   --   --  1.7  --   PHOS  --   --   --  2.2* 1.4* 1.8*   GFR: Estimated Creatinine Clearance: 89 mL/min (by C-G formula based on SCr of 0.58 mg/dL). Liver Function Tests: Recent Labs  Lab 04/21/19 2119 04/22/19 0744 04/23/19 0357  AST 117*  --  49*  ALT 22  --  22  ALKPHOS 73  --  71  BILITOT 0.6  --  0.4  PROT 6.3*  --  5.6*  ALBUMIN 3.4* 2.7* 2.8*   No results for input(s): LIPASE, AMYLASE in the last 168 hours. Recent Labs  Lab 04/21/19 2121 04/22/19 1908  AMMONIA 26  25   Coagulation Profile: Recent Labs  Lab 04/22/19 0104  INR 1.6*   Cardiac Enzymes: No results for input(s): CKTOTAL, CKMB, CKMBINDEX, TROPONINI in the last 168 hours. BNP (last 3 results) No results for input(s): PROBNP in the last 8760 hours. HbA1C: No results for input(s): HGBA1C in the last 72 hours. CBG: Recent Labs  Lab 04/21/19 2202  GLUCAP 103*   Lipid Profile: No results for input(s): CHOL, HDL, LDLCALC, TRIG, CHOLHDL, LDLDIRECT in the last 72 hours. Thyroid Function Tests: Recent Labs    04/21/19 2119 04/22/19 0104  TSH 0.210*  --   FREET4  --  0.42*   Anemia Panel: Recent Labs  04/22/19 0104 04/23/19 0357  FERRITIN 19 39   Sepsis Labs: Recent Labs  Lab 04/21/19 2145 04/22/19 0104  PROCALCITON  --  <0.10  LATICACIDVEN 0.8 0.7    Recent Results (from the past 240 hour(s))  Culture, blood (routine x 2)     Status: None (Preliminary result)   Collection Time: 04/21/19  9:45 PM   Specimen: BLOOD  Result Value Ref Range Status   Specimen Description BLOOD SITE NOT SPECIFIED  Final   Special Requests   Final    BOTTLES DRAWN AEROBIC AND ANAEROBIC Blood Culture adequate volume   Culture   Final    NO GROWTH 2 DAYS Performed at [facility], [place] [address] [place], [place] [address]    Report Status PENDING  Incomplete  SARS Coronavirus 2 (CEPHEID- Performed in [place] hospital lab), Hosp Order     Status: None   Collection Time: 04/21/19 10:55 PM   Specimen: Nasopharyngeal Swab  Result Value Ref Range Status   SARS Coronavirus 2 NEGATIVE NEGATIVE Final    Comment: (NOTE) If result is NEGATIVE SARS-CoV-2 target nucleic acids are NOT DETECTED. The SARS-CoV-2 RNA is generally detectable in upper and lower  respiratory specimens during the acute phase of infection. The lowest  concentration of SARS-CoV-2 viral copies this assay can detect is 250  copies / mL. A negative result does not preclude SARS-CoV-2 infection  and should not  be used as the sole basis for treatment or other  patient management decisions.  A negative result may occur with  improper specimen collection / handling, submission of specimen other  than nasopharyngeal swab, presence of viral mutation(s) within the  areas targeted by this assay, and inadequate number of viral copies  (<250 copies / mL). A negative result must be combined with clinical  observations, patient history, and epidemiological information. If result is POSITIVE SARS-CoV-2 target nucleic acids are DETECTED. The SARS-CoV-2 RNA is generally detectable in upper and lower  respiratory specimens dur ing the acute phase of infection.  Positive  results are indicative of active infection with SARS-CoV-2.  Clinical  correlation with patient history and other diagnostic information is  necessary to determine patient infection status.  Positive results do  not rule out bacterial infection or co-infection with other viruses. If result is PRESUMPTIVE POSTIVE SARS-CoV-2 nucleic acids MAY BE PRESENT.   A presumptive positive result was obtained on the submitted specimen  and confirmed on repeat testing.  While 2019 novel coronavirus  (SARS-CoV-2) nucleic acids may be present in the submitted sample  additional confirmatory testing may be necessary for epidemiological  and / or clinical management purposes  to differentiate between  SARS-CoV-2 and other Sarbecovirus currently known to infect humans.  If clinically indicated additional testing with an alternate test  methodology [phone]) is advised. The SARS-CoV-2 RNA is generally  detectable in upper and lower respiratory sp ecimens during the acute  phase of infection. The expected result is Negative. Fact Sheet for Patients:  StrictlyIdeas.no Fact Sheet for Healthcare Providers: BankingDealers.co.za This test is not yet approved or cleared by the [place] FDA and has been authorized  for detection and/or diagnosis of SARS-CoV-2 by FDA under an Emergency Use Authorization (EUA).  This EUA will remain in effect (meaning this test can be used) for the duration of the COVID-19 declaration under Section 564(b)(1) of the Act, 21 U.S.C. section 360bbb-3(b)(1), unless the authorization is terminated or revoked sooner. Performed at [facility], [place]  [address], [place], [place] [address]   SARS Coronavirus 2 (CEPHEID- Performed in [place] hospital lab), Hosp Order     Status: None   Collection Time: 04/22/19  7:38 AM   Specimen: Nasopharyngeal Swab  Result Value Ref Range Status   SARS Coronavirus 2 NEGATIVE NEGATIVE Final    Comment: (NOTE) If result is NEGATIVE SARS-CoV-2 target nucleic acids are NOT DETECTED. The SARS-CoV-2 RNA is generally detectable in upper and lower  respiratory specimens during the acute phase of infection. The lowest  concentration of SARS-CoV-2 viral copies this assay can detect is 250  copies / mL. A negative result does not preclude SARS-CoV-2 infection  and should not be used as the sole basis for treatment or other  patient management decisions.  A negative result may occur with  improper specimen collection / handling, submission of specimen other  than nasopharyngeal swab, presence of viral mutation(s) within the  areas targeted by this assay, and inadequate number of viral copies  (<250 copies / mL). A negative result must be combined with clinical  observations, patient history, and epidemiological information. If result is POSITIVE SARS-CoV-2 target nucleic acids are DETECTED. The SARS-CoV-2 RNA is generally detectable in upper and lower  respiratory specimens dur ing the acute phase of infection.  Positive  results are indicative of active infection with SARS-CoV-2.  Clinical  correlation with patient history and other diagnostic information is  necessary to determine patient infection status.  Positive results do  not  rule out bacterial infection or co-infection with other viruses. If result is PRESUMPTIVE POSTIVE SARS-CoV-2 nucleic acids MAY BE PRESENT.   A presumptive positive result was obtained on the submitted specimen  and confirmed on repeat testing.  While 2019 novel coronavirus  (SARS-CoV-2) nucleic acids may be present in the submitted sample  additional confirmatory testing may be necessary for epidemiological  and / or clinical management purposes  to differentiate between  SARS-CoV-2 and other Sarbecovirus currently known to infect humans.  If clinically indicated additional testing with an alternate test  methodology [phone]) is advised. The SARS-CoV-2 RNA is generally  detectable in upper and lower respiratory sp ecimens during the acute  phase of infection. The expected result is Negative. Fact Sheet for Patients:  StrictlyIdeas.no Fact Sheet for Healthcare Providers: BankingDealers.co.za This test is not yet approved or cleared by the [place] FDA and has been authorized for detection and/or diagnosis of SARS-CoV-2 by FDA under an Emergency Use Authorization (EUA).  This EUA will remain in effect (meaning this test can be used) for the duration of the COVID-19 declaration under Section 564(b)(1) of the Act, 21 U.S.C. section 360bbb-3(b)(1), unless the authorization is terminated or revoked sooner. Performed at [facility], [place] [address] [place], [place] [address]   MRSA PCR Screening     Status: None   Collection Time: 04/22/19 11:50 AM   Specimen: Nasal Mucosa; Nasopharyngeal  Result Value Ref Range Status   MRSA by PCR NEGATIVE NEGATIVE Final    Comment:        The GeneXpert MRSA Assay (FDA approved for NASAL specimens only), is one component of a comprehensive MRSA colonization surveillance program. It is not intended to diagnose MRSA infection nor to guide or monitor treatment for MRSA infections. Performed  at [facility], [place] [address] [place], [place] [address]   Urine culture     Status: Abnormal   Collection Time: 04/22/19  7:31 PM   Specimen: In/Out Cath Urine  Result Value Ref Range Status   Specimen Description IN/OUT CATH URINE  Final   Special Requests   Final    NONE Performed at [facility], [place] [address] [place], [place] [address]    Culture MULTIPLE SPECIES PRESENT, SUGGEST RECOLLECTION (A)  Final   Report Status 04/23/2019 FINAL  Final  SARS Coronavirus 2 (CEPHEID - Performed in [place] hospital lab), Hosp Order     Status: None   Collection Time: 04/23/19 12:28 PM   Specimen: Nasopharyngeal Swab  Result Value Ref Range Status   SARS Coronavirus 2 NEGATIVE NEGATIVE Final    Comment: (NOTE) If result is NEGATIVE SARS-CoV-2 target nucleic acids are NOT DETECTED. The SARS-CoV-2 RNA is generally detectable in upper and lower  respiratory specimens during the acute phase of infection. The lowest  concentration of SARS-CoV-2 viral copies this assay can detect is 250  copies / mL. A negative result does not preclude SARS-CoV-2 infection  and should not be used as the sole basis for treatment or other  patient management decisions.  A negative result may occur with  improper specimen collection / handling, submission of specimen other  than nasopharyngeal swab, presence of viral mutation(s) within the  areas targeted by this assay, and inadequate number of viral copies  (<250 copies / mL). A negative result must be combined with clinical  observations, patient history, and epidemiological information. If result is POSITIVE SARS-CoV-2 target nucleic acids are DETECTED. The SARS-CoV-2 RNA is generally detectable in upper and lower  respiratory specimens dur ing the acute phase of infection.  Positive  results are indicative of active infection with SARS-CoV-2.  Clinical  correlation with patient history and other diagnostic information is  necessary to  determine patient infection status.  Positive results do  not rule out bacterial infection or co-infection with other viruses. If result is PRESUMPTIVE POSTIVE SARS-CoV-2 nucleic acids MAY BE PRESENT.   A presumptive positive result was obtained on the submitted specimen  and confirmed on repeat testing.  While 2019 novel coronavirus  (SARS-CoV-2) nucleic acids may be present in the submitted sample  additional confirmatory testing may be necessary for epidemiological  and / or clinical management purposes  to differentiate between  SARS-CoV-2 and other Sarbecovirus currently known to infect humans.  If clinically indicated additional testing with an alternate test  methodology [phone]) is advised. The SARS-CoV-2 RNA is generally  detectable in upper and lower respiratory sp ecimens during the acute  phase of infection. The expected result is Negative. Fact Sheet for Patients:  StrictlyIdeas.no Fact Sheet for Healthcare Providers: BankingDealers.co.za This test is not yet approved or cleared by the [place] FDA and has been authorized for detection and/or diagnosis of SARS-CoV-2 by FDA under an Emergency Use Authorization (EUA).  This EUA will remain in effect (meaning this test can be used) for the duration of the COVID-19 declaration under Section 564(b)(1) of the Act, 21 U.S.C. section 360bbb-3(b)(1), unless the authorization is terminated or revoked sooner. Performed at [facility], [place] [address] [place], [place] [address]          Radiology Studies: No results found.      Scheduled Meds: . benztropine  0.5 mg Oral QHS  . docusate sodium  100 mg Oral BID  . gabapentin  400 mg Oral TID WC & HS  . levothyroxine  50 mcg Oral Daily  . LORazepam  1 mg Intravenous Q8H  . pantoprazole  40 mg Oral Daily  .  potassium chloride  40 mEq Oral Once  . sodium chloride flush  10-40 mL Intracatheter Q12H  . sodium  chloride flush  3 mL Intravenous Q12H   Continuous Infusions: . acetaminophen Stopped (04/23/19 2336)  . acyclovir 670 mg (04/24/19 0826)  . azithromycin 500 mg (04/23/19 2325)  . cefTRIAXone (ROCEPHIN)  IV 2 g (04/24/19 0219)  . dextrose 5 % 900 mL with potassium chloride 40 mEq, sodium bicarbonate 100 mEq infusion 100 mL/hr at 04/23/19 2328  . sodium phosphate  Dextrose 5% IVPB    . thiamine injection 500 mg (04/23/19 2145)  . vancomycin 1,000 mg (04/24/19 0646)     LOS: 2 days    Time spent: 35 minutes    Elmarie Shiley, MD [facility] Pager [phone]  If 7PM-7AM, please contact night-coverage [email or web address] Password [facility] 04/24/2019, 8:52 AM

## 2019-04-24 NOTE — Progress Notes (Signed)
Neurology Progress Note   S:// Patient continued to be combative overnight. Did not participate in getting a CTA or EEG or MRI. Third cover test negative Spoke with the hospitalist this morning- recommended tests to be done after intubating patient if she is unable to cooperate and tolerate the procedure-best to protect her airway and have her sedated to do the test. Spoke with PCCM-Dr. Agarwala excepted the patient to the ICU, intubated the patient, MRI and LP done.  EEG pending.   O:// Current vital signs: BP 104/61   Pulse 70   Temp 99.1 F (37.3 C) (Axillary)   Resp 20   Ht 5\' 4"  (1.626 m)   Wt 89.1 kg   SpO2 94%   BMI 33.72 kg/m  Vital signs in last 24 hours: Temp:  [97.6 F (36.4 C)-99.4 F (37.4 C)] 99.1 F (37.3 C) (07/16 1200) Pulse Rate:  [29-126] 70 (07/16 1430) Resp:  [11-24] 20 (07/16 1430) BP: (80-149)/(38-112) 104/61 (07/16 1430) SpO2:  [86 %-100 %] 94 % (07/16 1430) FiO2 (%):  [50 %-100 %] 50 % (07/16 1410) Weight:  [89.1 kg] 89.1 kg (07/16 0500) My examination was right after intubation-sedated and paralyzed Pupils equal round reactive light Minimal corneals bilaterally No movement to noxious stimulation Breathing with the ventilator General exam: Sedated and intubated HEENT: Bilateral raccoon eyes Abdomen obese, non-tender Respiration: Vented   Medications  Current Facility-Administered Medications:  .  midazolam (VERSED) 2 MG/2ML injection, , , ,  .  acetaminophen (TYLENOL) tablet 650 mg, 650 mg, Oral, Q6H PRN, Jonah BlueYates, Jennifer, MD .  acyclovir (ZOVIRAX) 670 mg in dextrose 5 % 100 mL IVPB, 10 mg/kg (Adjusted), Intravenous, Q8H, Regalado, Belkys A, MD, Stopped at 04/24/19 0926 .  albuterol (VENTOLIN HFA) 108 (90 Base) MCG/ACT inhaler 2 puff, 2 puff, Inhalation, Q6H PRN, Jonah BlueYates, Jennifer, MD .  azithromycin (ZITHROMAX) 500 mg in sodium chloride 0.9 % 250 mL IVPB, 500 mg, Intravenous, Q24H, Jonah BlueYates, Jennifer, MD, Last Rate: 250 mL/hr at 04/23/19 2325,  500 mg at 04/23/19 2325 .  benztropine (COGENTIN) tablet 0.5 mg, 0.5 mg, Oral, QHS, Jonah BlueYates, Jennifer, MD .  bisacodyl (DULCOLAX) EC tablet 5 mg, 5 mg, Oral, Daily PRN, Jonah BlueYates, Jennifer, MD .  cefTRIAXone (ROCEPHIN) 2 g in sodium chloride 0.9 % 100 mL IVPB, 2 g, Intravenous, Q12H, Regalado, Belkys A, MD, Last Rate: 200 mL/hr at 04/24/19 0219, 2 g at 04/24/19 0219 .  dextrose 5 % 900 mL with potassium chloride 40 mEq, sodium bicarbonate 100 mEq infusion, , Intravenous, Continuous, Regalado, Belkys A, MD, Last Rate: 100 mL/hr at 04/24/19 1451 .  docusate (COLACE) 50 MG/5ML liquid 100 mg, 100 mg, Per Tube, BID PRN, Ollis, Brandi L, NP .  fentaNYL (SUBLIMAZE) bolus via infusion 50 mcg, 50 mcg, Intravenous, Q15 min PRN, Ollis, Brandi L, NP .  fentaNYL (SUBLIMAZE) injection 50 mcg, 50 mcg, Intravenous, Once, Ollis, Brandi L, NP .  fentaNYL 2500mcg in NS 250mL (2310mcg/ml) infusion-PREMIX, 50-200 mcg/hr, Intravenous, Continuous, Ollis, Brandi L, NP .  gabapentin (NEURONTIN) 250 MG/5ML solution 400 mg, 400 mg, Oral, Q6H, Agarwala, Ravi, MD .  levothyroxine (SYNTHROID) tablet 50 mcg, 50 mcg, Oral, Daily, Jonah BlueYates, Jennifer, MD .  midazolam (VERSED) injection 2 mg, 2 mg, Intravenous, Once, Agarwala, Ravi, MD .  ondansetron (ZOFRAN) tablet 4 mg, 4 mg, Oral, Q6H PRN **OR** ondansetron (ZOFRAN) injection 4 mg, 4 mg, Intravenous, Q6H PRN, Jonah BlueYates, Jennifer, MD, 4 mg at 04/24/19 0101 .  pantoprazole sodium (PROTONIX) 40 mg/20 mL oral suspension 40 mg,  40 mg, Per Tube, Q1200, Ollis, Brandi L, NP .  polyethylene glycol (MIRALAX / GLYCOLAX) packet 17 g, 17 g, Oral, Daily PRN, Jonah BlueYates, Jennifer, MD .  propofol (DIPRIVAN) 1000 MG/100ML infusion, 0-50 mcg/kg/min, Intravenous, Continuous, Lynnell CatalanAgarwala, Ravi, MD, Stopped at 04/24/19 1204 .  rocuronium (ZEMURON) injection 40 mg, 40 mg, Intravenous, Once, Agarwala, Ravi, MD .  sodium chloride flush (NS) 0.9 % injection 10-40 mL, 10-40 mL, Intracatheter, Q12H, Jonah BlueYates, Jennifer, MD, 10 mL at  04/24/19 0829 .  sodium chloride flush (NS) 0.9 % injection 10-40 mL, 10-40 mL, Intracatheter, PRN, Jonah BlueYates, Jennifer, MD, 10 mL at 04/23/19 0844 .  sodium chloride flush (NS) 0.9 % injection 3 mL, 3 mL, Intravenous, Q12H, Jonah BlueYates, Jennifer, MD, 3 mL at 04/24/19 0831 .  thiamine 500mg  in normal saline (50ml) IVPB, 500 mg, Intravenous, TID, Caryl PinaLindzen, Eric, MD, Stopped at 04/24/19 1020 .  vancomycin (VANCOCIN) IVPB 1000 mg/200 mL premix, 1,000 mg, Intravenous, Q8H, Regalado, Belkys A, MD, Stopped at 04/24/19 0912 Labs CBC    Component Value Date/Time   WBC 9.3 04/24/2019 1155   RBC 3.11 (L) 04/24/2019 1155   HGB 12.2 04/24/2019 1233   HCT 36.0 04/24/2019 1233   PLT 236 04/24/2019 1155   MCV 92.3 04/24/2019 1155   MCH 28.9 04/24/2019 1155   MCHC 31.4 04/24/2019 1155   RDW 16.0 (H) 04/24/2019 1155   LYMPHSABS 0.9 04/24/2019 1155   MONOABS 1.4 (H) 04/24/2019 1155   EOSABS 0.0 04/24/2019 1155   BASOSABS 0.0 04/24/2019 1155    CMP     Component Value Date/Time   NA 145 04/24/2019 1233   K 3.3 (L) 04/24/2019 1233   CL 105 04/24/2019 1155   CO2 32 04/24/2019 1155   GLUCOSE 136 (H) 04/24/2019 1155   BUN <5 (L) 04/24/2019 1155   CREATININE 0.47 04/24/2019 1155   CALCIUM 8.0 (L) 04/24/2019 1155   PROT 5.2 (L) 04/24/2019 1155   ALBUMIN 2.6 (L) 04/24/2019 1155   AST 34 04/24/2019 1155   ALT 22 04/24/2019 1155   ALKPHOS 68 04/24/2019 1155   BILITOT 0.2 (L) 04/24/2019 1155   GFRNONAA >60 04/24/2019 1155   GFRAA >60 04/24/2019 1155   Imaging I have reviewed images in epic and the results pertinent to this consultation are: MRI examination of the brain-no acute abnormality.  Mildly increased sulcal hyperintensities posteriorly, can reflect propofol or hyperoxygenation effect. MRA head also unremarkable for acute process or LVO.  Assessment: 53 year old woman with one-week history of gait unsteadiness, worsening confusion, pneumonia-tested for COVID x3-negative, no clear source of severe  altered mental status. Has significant substance/EtOH and psychiatric history.  Also was positive for possible salicylate poisoning Negative urinary toxicology screen Suspicion remains for a CNS infectious process in the absence of a normal MRI. Although this could simply be medication overdose/poisoning followed by aspiration pneumonia, in the absence of substantial and concrete history, it is prudent to rule out a CNS infection as that could be fatal.  Impression: Multifactorial toxic metabolic encephalopathy Evaluate for CNS infection  Recommendations: LP performed by Dr. Marcelina MorelAgarwala-ordered cell count, protein, glucose, HSV, cryptococcal antigen Check HIV testing Check hepatitis panel EEG-routine.  Doubt will need LTM, but if routine EEG is abnormal, might want to monitor her on LTM EEG.  -- Milon DikesAshish Merrick Maggio, MD [facility] Pager: [phone] If 7pm to 7am, please call on call as listed on AMION.  CRITICAL CARE ATTESTATION Performed by: Milon DikesAshish Ondrea Dow, MD Total critical care time: 30 minutes Critical care time was exclusive of  separately billable procedures and treating other patients and/or supervising APPs/Residents/Students Critical care was necessary to treat or prevent imminent or life-threatening deterioration due to multifactorial toxic metabolic encephalopathy This patient is critically ill and at significant risk for neurological worsening and/or death and care requires constant monitoring. Critical care was time spent personally by me on the following activities: development of treatment plan with patient and/or surrogate as well as nursing, discussions with consultants, evaluation of patient's response to treatment, examination of patient, obtaining history from patient or surrogate, ordering and performing treatments and interventions, ordering and review of laboratory studies, ordering and review of radiographic studies, pulse oximetry, re-evaluation of patient's  condition, participation in multidisciplinary rounds and medical decision making of high complexity in the care of this patient.

## 2019-04-24 NOTE — Procedures (Signed)
ELECTROENCEPHALOGRAM REPORT   Patient: Mary Dodson       Room #: 6B84Y EEG No. ID: [id number] Age: 53 y.o.        Sex: female Referring Physician: Agarwala Report Date:  04/24/2019        Interpreting Physician: Alexis Goodell  History: Mary Dodson is an 53 y.o. female with altered mental status  Medications:  Fentanyl, Propofol, Versed, Acyclovir, Zithromax, Cogentiin, Neurontin, Synthroid, Vancomycin  Conditions of Recording:  This is a 21 channel routine scalp EEG performed with bipolar and monopolar montages arranged in accordance to the international 10/20 system of electrode placement. One channel was dedicated to EKG recording.  The patient is in the intubated and sedated state.  Description:  The background activity is discontinuous. It consists of short, generalized bursts of a disorganized mixture pf frequencies that consist mostly of theta and delta activity with supermimposed beta.  These short bursts last for one to two seconds and alternate with periods of attenuation lasting up to 5 seconds.  This discontinuous activity is maintained throughout the tracing.  No activation procedures were performed. Hyperventilation and intermittent photic stimulation were not performed.  IMPRESSION: This is an abnormal EEG due to a discontinuous background.  This finding is consistent with the patient's medications but can not rule out other causes such as drug intoxication and hypothermia, among other possibilities.  Clinical/neurological, and radiographic correlation advised.    Alexis Goodell, MD Neurology [phone] 04/24/2019, 5:18 PM

## 2019-04-24 NOTE — Procedures (Signed)
Intubation Procedure Note  Mary Dodson  [id number] [birth year]   Procedure: Intubation Indications: Airway protection and maintenance  Procedure Details Consent: Unable to obtain consent because of altered level of consciousness. Time Out: Verified patient identification, verified procedure, site/side was marked, verified correct patient position, special equipment/implants available, medications/allergies/relevent history reviewed, required imaging and test results available.  Performed  Pre-oxygenation: 100% via  Premedication: versed 2, fentanyl 100 Position: supine Induction agent: etomidate 20 Paralytic: no Technique: Videolaryngoscopy, 4 blade Tube size: 7.5 Laryngoscopy view: 2 Number of attempts:1 Insertion depth: 25cm Tube secured: tube holder Other findings: small posterior oropharynx and anterior larynx   OGT/NGT inserted: yes  Position confirmed by auscultation: yes   Evaluation Colorimetric change: yes  Bilateral breath sounds: yes  Hemodynamic Status: Transient hypotension treated with fluid; O2 sats: stable throughout Patient's Current Condition: stable Complications: Complications of airway bleeding Patient did tolerate procedure well. Chest X-ray ordered to verify placement.  CXR: tube position acceptable.   Mary Dodson 08/21/2018

## 2019-04-24 NOTE — Progress Notes (Signed)
Spoke w Ronalee Belts, RN - pt is even more agitated today than yesterday. Plan for pt to either move or have other procedures requiring sedatives will possibly allow [place] to "piggy back" EEG procedure but this depends on other plans for MRI etc. Will communicate with Ronalee Belts later in the day and will check back as schedule permits.

## 2019-04-24 NOTE — Progress Notes (Signed)
Pt brought to MRI to obtain Brain MRI.  Pt given meds by RN before leaving the floor.  Pt came to MRI thrashing and moving all over the place.   Pt does not like any stimulation so we did not feel that putting her in the MRI machine would be Safe at this time.   I spoke with Dr Rory Percy concerning this and he was ok with waiting on MRI when patient can safely tolerate.

## 2019-04-24 NOTE — Progress Notes (Signed)
Per Normand Sloop, RN daughter to sign consent.  Attempted to call  Anhar Mcdermott [phone]) for consent for PICC but no answer, unable to leave voice mail.  Will try again later.  Carolee Rota, RN

## 2019-04-24 NOTE — Progress Notes (Signed)
Pharmacy Antibiotic Note  Mary Dodson is a 53 y.o. female admitted on 04/21/2019 with acute encephalopathy. Pharmacy has been consulted for antibiotic dosing for bacterial/viral meningitis (does not need Listeria coverage, per consult note). COVID encephalitis is also under consideration (rapid test negative).  Medical history includes: PTSD, bipolar disorder, ethanol dependence, hypothyroidism, salicylate poisoning, acute on chronic respiratory failure.  Renal function stable; current CrCl ~87 mL/min Vancomycin level this PM = 17, therapeutic, drawn 1.5 hours early  Plan: Continue Vancomycin 1000 mg iv Q 8 for now Repeat trough level in a few days if vancomycin continues Continue Ceftriaxone 2 grams iv Q 12  Continue Acyclovir 670 mg iv Q 8 hours  Height: 5\' 4"  (162.6 cm) Weight: 196 lb 6.9 oz (89.1 kg) IBW/kg (Calculated) : 54.7  Temp (24hrs), Avg:98.5 F (36.9 C), Min:97.6 F (36.4 C), Max:99.4 F (37.4 C)  Recent Labs  Lab 04/21/19 2119 04/21/19 2145 04/22/19 0104 04/22/19 0744 04/23/19 0357 04/24/19 1155 04/24/19 2119  WBC 9.4  --   --   --  9.3 9.3  --   CREATININE 0.93  --  0.79 0.66 0.58 0.47  --   LATICACIDVEN  --  0.8 0.7  --   --   --   --   VANCOTROUGH  --   --   --   --   --   --  17    Estimated Creatinine Clearance: 89 mL/min (by C-G formula based on SCr of 0.47 mg/dL).    Allergies  Allergen Reactions  . Morphine And Related Other (See Comments)    "Makes me crazy"  . Morphine And Related Other (See Comments)    "It is bad"  . Morphine And Related   . Trazodone And Nefazodone Other (See Comments)     Thank you Anette Guarneri, PharmD Clinical Pharmacist 04/24/2019 10:27 PM

## 2019-04-24 NOTE — Progress Notes (Signed)
NAME:  Mary Dodson Decicco, MRN:  [id number], DOB:  26-Oct-1965, LOS: 2 ADMISSION DATE:  04/21/2019, CONSULTATION DATE:  04/24/2019 REFERRING MD: Hartley BarefootBelkys Regalado, CHIEF COMPLAINT:  AMS  Brief History    Mary Dodson Briley is a 53 y.o. female with medical history significant of PTSD, bipolar d/o, ETOH dependence, and hypothyroidism who presented with AMS, fever, and agitation consistent wit acute encephalopathy. Patient also found to have Acute on Chronic respiratory failure with hypoxia requiring intubation. PCCM consulted for vent management.  History of present illness    Mary Dodson Wendling is a 53 y.o. female with medical history significant of PTSD; bipolar d/o; ETOH dependence; and hypothyroidism presenting with AMS.  She was seen by neurology in 6/20 for episodic headache with confusion/delirium. She had a normal awake and asleep EEG on 6/17.  EMS was called on 7/12 by her friend due to concerns of recurrent falls, AMS.  She was seen  by PA Joy, and the patient was alert enough to answer questions (in a belligerent and somewhat nonsensical manner).  In the ED, there was concern for sepsis and she was given a 1L bolus of LR and Rocephin/Azithromycin after she had fever. She was found to have an elevated salicylate level and Poison Control was contacted. They recommended calling nephrology (dialysis not indicated) and following the level.  She was started on a bicarb drip mixed with KCl and D5W.  CXR showed B PNA and O2 sats dropped to 90% so she was started on 2L O2.  She required Ativan and Geodon IM in ED due to periodic severe agitation.  She pulled out all of her tubes (foley, multiple IVs).   Of note, she has complained of severe headaches for the last few weeks.  She has complained of feeling like she was in a fog.  She "comes in contact with those people who got that COVID infection."  She said she was taking lots of BC powders.  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  She goes to AA - hasn't had "nothing in about 3 years."    Past Medical History  - Alcohol use disorder, severe dependence - Bipolar disorder, current episode manic severe with psychotic features  - Hypothyroidism (acquired) - PTSD Significant Hospital Events    04/22/2019 admitted 04/24/2019 ETT Consults:  Nephrology Neurology PCCM  Procedures:  ETT 04/24/2019 >>  Significant Diagnostic Tests:  MRSA PCR > negative SARS-CoV-2 > negative x3  CT Head Wo Contrast 04/22/2019 IMPRESSION: No acute intracranial or cervical spine finding.  CT Cervical Spine Wo Contrast 04/22/2019 IMPRESSION: No acute intracranial or cervical spine finding.  CXR 04/21/2019 Impression: Patchy and indistinct bilateral pulmonary opacity suspicious for bilateral pneumonia. Consider viral/atypical etiology. No pleural effusion. Micro Data:  UCx 7/14 > recollect (dirty sample) BCx 7/13 > NGTD UCx 7/16> Resp Cx 7/16> Antimicrobials:  Azithromycin - 7/13>>  Ceftriaxone - 7/13 >> Vancomycin - 7/15>>  Antivirals:  Acyclovir - 7/15>>   Interim history/subjective:  - Patient is sedated and s/p intubation.  Objective   Blood pressure (!) 112/95, pulse (!) 29, temperature 98.4 F (36.9 C), temperature source Axillary, resp. rate 18, height 5\' 4"  (1.626 m), weight 89.1 kg, SpO2 96 %, unknown if currently breastfeeding.        Intake/Output Summary (Last 24 hours) at 04/24/2019 0959 Last data filed at 04/24/2019  8527 Gross per 24 hour  Intake 3936.67 ml  Output 2400 ml  Net 1536.67 ml   Filed Weights   04/22/19 1147 04/23/19 0346 04/24/19 0500  Weight: 86.6 kg 85.7 kg 89.1 kg    Examination: General: lying in bed NAD. Does not open eyes to voice HENT: NCAT, MMM, ETT in place Lungs: Course rhonchi bilaterally, Nl WOB on mechanical ventilation Cardiovascular: RRR, Nl  S1/S2, no m/r/g Abdomen: Soft, NTND, BS+ x4 quadrants Extremities: no peripheral edema. Peripheral pulses 2+ bilaterally in UEs and LEs Neuro: Sedated GU: Foley in place Skin: abrasions under both R and L knee. Bruising under both eyes.  Resolved Hospital Problem list   X  Assessment & Plan:   #Neuro Acute encephalopathy - Neurology following, appreciate rec's - UDS is negative, but positive for salicylate toxicity (see below) - Concern for toxic encephalopathy vs. HSV encephalitis vs Meningitis. - COVID encephalitis less likely given negative testing x3 despite supportive labs normal WBC count;  increased LFTs; low procalcitonin; markedly elevated D-dimer (>1); markedly elevated CRP (>7) Plan - f/u EEG and MRI w/wo contrast - f/u CT angio head - Continue PRN Ativan, RASS goal -2 given agitation - f/u repeat COVID test to r/o COVID encephalitis - Continue Acyclovir (Start 7/15 >> ) to cover HSV encephalitis, de-escalate pending labs. - Continue CIWA protocol - Continue IV thiamine start 7/15>7/17, for thiamine deficiency in the setting of  - LP and CSF Cx and gram stain given concern for infectious encephalopathy  #RESPIRATORY Acute on chronic respiratory failure with hypoxia - S/p Intubation on 7/16 for airway protection and maintenance.  - CXR with multifocal opacities which may be c/w COVID vs. Multifocal PNA -She does have a reported h/o COPD and so it is possible that her hypoxia is more chronic in nature, but her ABG did appreciate mild hypoxia without hypercapnia Plan: - Continue mechanical ventilation, SBT as tolerated  #ID - Patient with fever to 103 while in the ER with hypoxia. 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 - CXR with multifocal opacities concerning for Multifocal PNA Plan: - Continue ceftriaxone and azithromycin and Vancomycin, de-escalate pending labs. -  f/u UCx - f/u Resp Cx  #HEM  Normocytic anemia - Hgb 11.2>9.3 today and Hct 33>28.5 today, normal MCV. Ferritin wnl. Plan - CTM H/H, transfuse if Hgb<7  #METABOLIC ASA toxicity likely 2/2 to [facility] powder use - H/o taking BC headache powders at home - Salicylate level was 41 at presentation and has decreased to [facility] - Nephrology signed off, as it does not appear that the patient will require dialysis. Recommends supportive care. Plan: - CTM CMP  ETOH dependence -Patient has reported long-standing h/o ETOH dependence -Not clear when her last drink was. ETOH level was negative on presentation Plan - Continue CIWA protocol  #ENDOCRINE Hypothyroidism -Her TSH is suppressed, but her free T4 is low rather than high -Suspect sick euthyroid rather than true thyroid dysfunction at this time -Normal thyroid testing in 6/19 Plan: - Hold synthroid  #PSYCH Bipolar d/o Plan - Holding home meds, continue when she is able to take PO - Continue Cogentin and Gabapentin - Hold Doxepin and Zoloft given concern for serotonin syndrome   Best practice:  Diet: NPO Pain/Anxiety/Delirium protocol (if indicated): Ativan sch, Fentanyl PRN  VAP protocol (if indicated): NA DVT prophylaxis: Lovenox GI prophylaxis: PPI Glucose control: None Code Status: FULL Family Communication: NA  Disposition: ICU  Labs   CBC: Recent Labs  Lab 04/21/19 2119 04/22/19 0011 04/23/19 0357  WBC 9.4  --  9.3  NEUTROABS 7.1  --  6.8  HGB 10.9* 11.2* 9.3*  HCT 33.4* 33.0* 28.5*  MCV 89.8  --  89.9  PLT 264  --  219    Basic Metabolic Panel: Recent Labs  Lab 04/21/19 2119 04/22/19 0011 04/22/19 0104 04/22/19 0744 04/23/19 0357 04/23/19 2022  NA 141 142 140 144 142  --   K 3.4* 3.7 3.2* 3.5 3.5  --   CL 108  --  111 114* 105  --   CO2 19*  --  17* 20* 27  --   GLUCOSE 109*  --  105* 106* 193*  --   BUN 15  --  14 12 <5*  --   CREATININE 0.93  --  0.79 0.66 0.58  --   CALCIUM 8.2*  --  7.5* 7.4*  8.1*  --   MG  --   --   --   --  1.7  --   PHOS  --   --   --  2.2* 1.4* 1.8*   GFR: Estimated Creatinine Clearance: 89 mL/min (by C-G formula based on SCr of 0.58 mg/dL). Recent Labs  Lab 04/21/19 2119 04/21/19 2145 04/22/19 0104 04/23/19 0357  PROCALCITON  --   --  <0.10  --   WBC 9.4  --   --  9.3  LATICACIDVEN  --  0.8 0.7  --     Liver Function Tests: Recent Labs  Lab 04/21/19 2119 04/22/19 0744 04/23/19 0357  AST 117*  --  49*  ALT 22  --  22  ALKPHOS 73  --  71  BILITOT 0.6  --  0.4  PROT 6.3*  --  5.6*  ALBUMIN 3.4* 2.7* 2.8*   No results for input(s): LIPASE, AMYLASE in the last 168 hours. Recent Labs  Lab 04/21/19 2121 04/22/19 1908  AMMONIA 26 25    ABG    Component Value Date/Time   PHART 7.358 04/22/2019 0011   PCO2ART 33.2 04/22/2019 0011   PO2ART 66.0 (L) 04/22/2019 0011   HCO3 18.7 (L) 04/22/2019 0011   TCO2 20 (L) 04/22/2019 0011   ACIDBASEDEF 6.0 (H) 04/22/2019 0011   O2SAT 92.0 04/22/2019 0011     Coagulation Profile: Recent Labs  Lab 04/22/19 0104  INR 1.6*    Cardiac Enzymes: No results for input(s): CKTOTAL, CKMB, CKMBINDEX, TROPONINI in the last 168 hours.  HbA1C: Hgb A1c MFr Bld  Date/Time Value Ref Range Status  10/03/2016 06:25 AM 5.4 4.8 - 5.6 % Final    Comment:    (NOTE)         Pre-diabetes: 5.7 - 6.4         Diabetes: >6.4         Glycemic control for adults with diabetes: <7.0     CBG: Recent Labs  Lab 04/21/19 2202  GLUCAP 103*    Review of Systems:   Unable to assess as patient unresponsive to command. I have personally reviewed the patient's data and records.  Past Medical History  She,  has a past medical history of Alcohol abuse, Bipolar disease, chronic (HCC), Hypothyroidism (acquired), and PTSD (post-traumatic stress disorder).   Surgical History   History reviewed. No pertinent surgical history.   Social History   reports that she has been smoking cigarettes. She has been smoking about  1.00 pack per day.  She has never used smokeless tobacco. She reports previous alcohol use. She reports that she does not use drugs.   Family History   Her family history includes Breast cancer in her maternal grandmother; Cancer in her maternal grandmother and mother; Heart attack in her father.   Allergies Allergies  Allergen Reactions  . Morphine And Related Other (See Comments)    "Makes me crazy"  . Morphine And Related Other (See Comments)    "It is bad"  . Morphine And Related   . Trazodone And Nefazodone Other (See Comments)     Home Medications  Prior to Admission medications   Medication Sig Start Date End Date Taking? Authorizing Provider  albuterol (VENTOLIN HFA) 108 (90 Base) MCG/ACT inhaler Inhale 2 puffs into the lungs every 6 (six) hours as needed for wheezing or shortness of breath.    [provider]  Amino Acids (AMINO ACID PO) Take 1 tablet by mouth daily.    [provider]  Aspirin-Salicylamide-Caffeine (BC HEADACHE PO) Take 6-7 packets by mouth daily as needed (headache).    [provider]  benztropine (COGENTIN) 0.5 MG tablet Take 0.5 mg by mouth at bedtime. 02/20/18   [provider]  calcium carbonate (OSCAL) 1500 (600 Ca) MG TABS tablet Take 600 mg of elemental calcium by mouth daily with breakfast.    [provider]  doxepin (SINEQUAN) 75 MG capsule Take 75 mg by mouth at bedtime as needed (sleep).  04/07/19   [provider]  gabapentin (NEURONTIN) 400 MG capsule Take 2 capsules (800 mg total) by mouth 4 (four) times daily -  with meals and at bedtime. Patient taking differently: Take 400 mg by mouth 4 (four) times daily -  with meals and at bedtime.  10/06/16   Oneta RackLewis, Tanika N, NP  ibuprofen (ADVIL) 800 MG tablet Take 800 mg by mouth 3 (three) times daily as needed for fever or headache (pain).  04/17/19   [provider]  Multiple Vitamins-Minerals (MULTIVITAMIN WITH MINERALS) tablet Take 1 tablet  by mouth daily.    [provider]  Omega-3 Fatty Acids (FISH OIL OMEGA-3 PO) Take 1 tablet by mouth daily.    [provider]  omeprazole (PRILOSEC) 40 MG capsule Take 40 mg by mouth daily. 05/27/18   [provider]  promethazine (PHENERGAN) 25 MG tablet Take 25 mg by mouth daily as needed for nausea or vomiting.  05/27/18   [provider]  QUEtiapine (SEROQUEL) 100 MG tablet Take 1 tablet (100 mg total) by mouth 2 (two) times daily. Patient taking differently: Take 100 mg by mouth 2 (two) times daily with breakfast and lunch. Also take 400 mg at bedtime 10/06/16   Oneta RackLewis, Tanika N, NP  QUEtiapine (SEROQUEL) 400 MG tablet Take 400 mg by mouth at bedtime. Also take 100 mg with breakfast and lunch 04/14/19   [provider]  sertraline (ZOLOFT) 100 MG tablet Take 100 mg by mouth daily after breakfast. 04/14/19   [provider]  SYNTHROID 50 MCG tablet Take 1 tablet (50 mcg total) by mouth daily. 03/20/18   Hoy RegisterNewlin, Enobong, MD     Critical care time: 40 min     Dorlis Judice Lonia BloodBlount IV, MS - [facility]

## 2019-04-24 NOTE — Progress Notes (Signed)
ETT pulled back to 24 per chest xray. Patient tolerated well.

## 2019-04-24 NOTE — Progress Notes (Signed)
RT pulled ETT back to 23 at the lip due to unequal breath sounds and MD order.

## 2019-04-24 NOTE — Progress Notes (Signed)
EEG complete - results pending 

## 2019-04-24 NOTE — Progress Notes (Signed)
Patient was transported to MRI and back to 4N19 without any apparent complications.

## 2019-04-24 NOTE — Progress Notes (Signed)
Called and updated daughter, Leafy Ro. Offered facetime option. All questions answered. Lianne Bushy RN BSN.

## 2019-04-24 NOTE — Procedures (Signed)
Critical Care Attending Procedure Note:  Procedure: Lumbar puncture.  Consent was obtained from daughter.  Time out called.   Description: patient was prepped and draped, and full barrier precautions employed.  Patient was already sedated on mechanical ventilation.  20G spinal needle was advanced into the L3-4 intervertebral space.  Number of attempts: 3.  50mL of clear colorless fluid was drained  Opening pressure: 26 cmCSF Closing pressure: 13 cmCSF.  Procedure well tolerated.

## 2019-04-24 NOTE — Progress Notes (Signed)
Peripherally Inserted Central Catheter/Midline Placement  The IV Nurse has discussed with the patient and/or persons authorized to consent for the patient, the purpose of this procedure and the potential benefits and risks involved with this procedure.  The benefits include less needle sticks, lab draws from the catheter, and the patient may be discharged home with the catheter. Risks include, but not limited to, infection, bleeding, blood clot (thrombus formation), and puncture of an artery; nerve damage and irregular heartbeat and possibility to perform a PICC exchange if needed/ordered by physician.  Alternatives to this procedure were also discussed.  Bard Power PICC patient education guide, fact sheet on infection prevention and patient information card has been provided to patient /or left at bedside.  Consent obtained via telephone from daughter Leafy Ro.  PICC inserted by Aldona Lento, RN   PICC/Midline Placement Documentation  PICC Triple Lumen 34/19/62 PICC Right Basilic 42 cm 0 cm (Active)  Indication for Insertion or Continuance of Line Vasoactive infusions;Poor Vasculature-patient has had multiple peripheral attempts or PIVs lasting less than 24 hours;Prolonged intravenous therapies 04/24/19 2056  Exposed Catheter (cm) 0 cm 04/24/19 2056  Site Assessment Clean;Dry;Intact 04/24/19 2056  Lumen #1 Status Flushed;Saline locked;Blood return noted 04/24/19 2056  Lumen #2 Status Flushed;Saline locked;Blood return noted 04/24/19 2056  Lumen #3 Status Flushed;Saline locked;Blood return noted 04/24/19 2056  Dressing Type Transparent 04/24/19 2056  Dressing Status Clean;Dry;Intact 04/24/19 2056  Dressing Intervention New dressing 04/24/19 2056  Dressing Change Due 05/01/19 04/24/19 2056       Truth Wolaver, Nicolette Bang 04/24/2019, 8:57 PM

## 2019-04-24 NOTE — Progress Notes (Signed)
Patient disoriented and nonverbal.  She is combative with touch and verbal cues. Provided lorazepam frequent per PRN orders during shift.  Patient was anxiety and agitation decreased slightly.  She required one time dose of haldol to help her relax and prevent dislodging of foley catheter and Midline. Debbie with MRI called for report around 5 am.  CIWA remains elevated with assess. Required 12 L high flow oxygen overnight due to patient agitation and holding breathing with frustration. Oxygen improved from 80%-100%.  Remains in stable condition at end of shift.

## 2019-04-24 NOTE — Progress Notes (Signed)
LTM hook up  Dr Rory Percy notified  Leads charged with this LTM s/u

## 2019-04-25 LAB — CBC WITH DIFFERENTIAL/PLATELET
Abs Immature Granulocytes: 0.03 10*3/uL (ref 0.00–0.07)
Basophils Absolute: 0 10*3/uL (ref 0.0–0.1)
Basophils Relative: 0 %
Eosinophils Absolute: 0.2 10*3/uL (ref 0.0–0.5)
Eosinophils Relative: 3 %
HCT: 25.6 % — ABNORMAL LOW (ref 36.0–46.0)
Hemoglobin: 8.1 g/dL — ABNORMAL LOW (ref 12.0–15.0)
Immature Granulocytes: 1 %
Lymphocytes Relative: 29 %
Lymphs Abs: 1.7 10*3/uL (ref 0.7–4.0)
MCH: 29 pg (ref 26.0–34.0)
MCHC: 31.6 g/dL (ref 30.0–36.0)
MCV: 91.8 fL (ref 80.0–100.0)
Monocytes Absolute: 0.7 10*3/uL (ref 0.1–1.0)
Monocytes Relative: 13 %
Neutro Abs: 3.1 10*3/uL (ref 1.7–7.7)
Neutrophils Relative %: 54 %
Platelets: 190 10*3/uL (ref 150–400)
RBC: 2.79 MIL/uL — ABNORMAL LOW (ref 3.87–5.11)
RDW: 15.9 % — ABNORMAL HIGH (ref 11.5–15.5)
WBC: 5.7 10*3/uL (ref 4.0–10.5)
nRBC: 0 % (ref 0.0–0.2)

## 2019-04-25 LAB — URINE CULTURE: Culture: NO GROWTH

## 2019-04-25 LAB — COMPREHENSIVE METABOLIC PANEL
ALT: 21 U/L (ref 0–44)
AST: 24 U/L (ref 15–41)
Albumin: 2.1 g/dL — ABNORMAL LOW (ref 3.5–5.0)
Alkaline Phosphatase: 85 U/L (ref 38–126)
Anion gap: 8 (ref 5–15)
BUN: <5 mg/dL — ABNORMAL LOW (ref 6–20)
CO2: 33 mmol/L — ABNORMAL HIGH (ref 22–32)
Calcium: 7.6 mg/dL — ABNORMAL LOW (ref 8.9–10.3)
Chloride: 102 mmol/L (ref 98–111)
Creatinine, Ser: 0.56 mg/dL (ref 0.44–1.00)
GFR calc Af Amer: >60 mL/min (ref >60)
GFR calc non Af Amer: >60 mL/min (ref >60)
Glucose, Bld: 123 mg/dL — ABNORMAL HIGH (ref 70–99)
Potassium: 3 mmol/L — ABNORMAL LOW (ref 3.5–5.1)
Sodium: 143 mmol/L (ref 135–145)
Total Bilirubin: 0.4 mg/dL (ref 0.3–1.2)
Total Protein: 4.4 g/dL — ABNORMAL LOW (ref 6.5–8.1)

## 2019-04-25 LAB — PHOSPHORUS: Phosphorus: 2.9 mg/dL (ref 2.5–4.6)

## 2019-04-25 LAB — MAGNESIUM: Magnesium: 1.7 mg/dL (ref 1.7–2.4)

## 2019-04-25 LAB — TRIGLYCERIDES: Triglycerides: 82 mg/dL (ref <150)

## 2019-04-25 LAB — GLUCOSE, CAPILLARY
Glucose-Capillary: 94 mg/dL (ref 70–99)
Glucose-Capillary: 116 mg/dL — ABNORMAL HIGH (ref 70–99)

## 2019-04-25 LAB — HIV ANTIBODY (ROUTINE TESTING W REFLEX): HIV Screen 4th Generation wRfx: NONREACTIVE

## 2019-04-25 MED ORDER — SODIUM CHLORIDE 0.9 % IV SOLN
2.0000 g | INTRAVENOUS | Status: DC
  Administered 2019-04-26 – 2019-04-27 (×2): 2 g via INTRAVENOUS
  Filled 2019-04-25 (×2): qty 2

## 2019-04-25 MED ORDER — VITAL AF 1.2 CAL PO LIQD
1000.0000 mL | ORAL | Status: DC
  Administered 2019-04-25: 1000 mL

## 2019-04-25 MED ORDER — PRO-STAT SUGAR FREE PO LIQD
30.0000 mL | Freq: Every day | ORAL | Status: DC
  Administered 2019-04-25: 30 mL
  Filled 2019-04-25: qty 30

## 2019-04-25 MED ORDER — ADULT MULTIVITAMIN W/MINERALS CH
1.0000 | ORAL_TABLET | Freq: Every day | ORAL | Status: DC
  Administered 2019-04-25: 1
  Filled 2019-04-25: qty 1

## 2019-04-25 MED ORDER — POTASSIUM CHLORIDE 20 MEQ PO PACK
40.0000 meq | PACK | Freq: Two times a day (BID) | ORAL | Status: DC
  Filled 2019-04-25: qty 2

## 2019-04-25 MED ORDER — POTASSIUM CHLORIDE 20 MEQ PO PACK
40.0000 meq | PACK | Freq: Two times a day (BID) | ORAL | Status: AC
  Administered 2019-04-25 (×2): 40 meq
  Filled 2019-04-25: qty 2

## 2019-04-25 NOTE — Plan of Care (Signed)
Tube feeding started today.

## 2019-04-25 NOTE — Progress Notes (Signed)
Neurology Progress Note   S:// Seen and examined On propofol for sedation Off sedation becomes agitated. Requiring physical restraints overnight   O:// Current vital signs: BP (!) 108/56   Pulse (!) 53   Temp 97.9 F (36.6 C) (Axillary)   Resp 20   Ht _0  (1.626 m)   Wt 89.1 kg   SpO2 100%   BMI 33.72 kg/m  Vital signs in last 24 hours: Temp:  [97.7 F (36.5 C)-101.4 F (38.6 C)] 97.9 F (36.6 C) (07/17 0800) Pulse Rate:  [51-84] 53 (07/17 0900) Resp:  [11-23] 20 (07/17 0900) BP: (80-144)/(38-112) 108/56 (07/17 0900) SpO2:  [86 %-100 %] 100 % (07/17 0900) FiO2 (%):  [40 %-100 %] 40 % (07/17 0736) Propofol briefly held for the exam General: Sedated intubated HEENT: Bilateral raccoon eyes CVS: Regular rate rhythm Respiratory: Vented Abdomen: Nondistended nontender Neurological exam Patient intubated sedated.  Propofol held for the exam. No spontaneous movement noted Breathing over the ventilator There is some reflexes intact Cranial: Pupils equal round react light, extraocular movement difficult to assess but has positive oculocephalics, does not blink to threat from either side, corneal reflexes are present, breathe over the ventilator, difficult to assess facial symmetry. Motor exam: No spontaneous movement but on noxious stimulation actively localizes with both extremities symmetrically.  To noxious stimulation, forcefully withdraws both lower extremities. Sensory exam: As above Coordination cannot be tested  Medications  Current Facility-Administered Medications:  .  acetaminophen (TYLENOL) solution 650 mg, 650 mg, Per Tube, Q6H PRN, Kipp Brood, MD, 650 mg at 04/25/19 0427 .  acyclovir (ZOVIRAX) 670 mg in dextrose 5 % 100 mL IVPB, 10 mg/kg (Adjusted), Intravenous, Q8H, Regalado, Belkys A, MD, Stopped at 04/25/19 0221 .  albuterol (VENTOLIN HFA) 108 (90 Base) MCG/ACT inhaler 2 puff, 2 puff, Inhalation, Q6H PRN, Karmen Bongo, MD .  azithromycin  (ZITHROMAX) 500 mg in sodium chloride 0.9 % 250 mL IVPB, 500 mg, Intravenous, Q24H, Karmen Bongo, MD, Stopped at 04/25/19 0132 .  benztropine (COGENTIN) tablet 0.5 mg, 0.5 mg, Per Tube, QHS, Agarwala, Ravi, MD, 0.5 mg at 04/25/19 0000 .  bisacodyl (DULCOLAX) EC tablet 5 mg, 5 mg, Oral, Daily PRN, Karmen Bongo, MD .  cefTRIAXone (ROCEPHIN) 2 g in sodium chloride 0.9 % 100 mL IVPB, 2 g, Intravenous, Q12H, Regalado, Belkys A, MD, Stopped at 04/25/19 0259 .  chlorhexidine gluconate (MEDLINE KIT) (PERIDEX) 0.12 % solution 15 mL, 15 mL, Mouth Rinse, BID, Agarwala, Ravi, MD, 15 mL at 04/25/19 0851 .  Chlorhexidine Gluconate Cloth 2 % PADS 6 each, 6 each, Topical, Daily, Kipp Brood, MD, 6 each at 04/24/19 2100 .  docusate (COLACE) 50 MG/5ML liquid 100 mg, 100 mg, Per Tube, BID PRN, Ollis, Brandi L, NP .  fentaNYL (SUBLIMAZE) bolus via infusion 50 mcg, 50 mcg, Intravenous, Q15 min PRN, Ollis, Brandi L, NP .  fentaNYL (SUBLIMAZE) injection 50 mcg, 50 mcg, Intravenous, Once, Ollis, Brandi L, NP .  fentaNYL 2591mg in NS 2594m(1063mml) infusion-PREMIX, 50-200 mcg/hr, Intravenous, Continuous, Ollis, Brandi L, NP, Last Rate: 17.5 mL/hr at 04/25/19 0800, 175 mcg/hr at 04/25/19 0800 .  gabapentin (NEURONTIN) 300 MG/6ML solution 400 mg, 400 mg, Per Tube, Q6H, Agarwala, Ravi, MD, 400 mg at 04/25/19 0531 .  levothyroxine (SYNTHROID) tablet 50 mcg, 50 mcg, Per Tube, Daily, Agarwala, Ravi, MD .  MEDLINE mouth rinse, 15 mL, Mouth Rinse, 10 times per day, AgaKipp BroodD, 15 mL at 04/25/19 0531 .  ondansetron (ZOFRAN) tablet 4 mg, 4 mg, Per  Tube, Q6H PRN **OR** ondansetron (ZOFRAN) injection 4 mg, 4 mg, Intravenous, Q6H PRN, Agarwala, Ravi, MD .  pantoprazole sodium (PROTONIX) 40 mg/20 mL oral suspension 40 mg, 40 mg, Per Tube, Q1200, Ollis, Brandi L, NP .  polyethylene glycol (MIRALAX / GLYCOLAX) packet 17 g, 17 g, Per Tube, Daily PRN, Agarwala, Ravi, MD .  potassium chloride (KLOR-CON) packet 40 mEq, 40  mEq, Oral, BID, Agarwala, Ravi, MD .  propofol (DIPRIVAN) 1000 MG/100ML infusion, 0-50 mcg/kg/min, Intravenous, Continuous, Agarwala, Ravi, MD, Last Rate: 10.69 mL/hr at 04/25/19 0800, 20 mcg/kg/min at 04/25/19 0800 .  sodium chloride flush (NS) 0.9 % injection 10-40 mL, 10-40 mL, Intracatheter, Q12H, Karmen Bongo, MD, 10 mL at 04/24/19 0829 .  sodium chloride flush (NS) 0.9 % injection 10-40 mL, 10-40 mL, Intracatheter, PRN, Karmen Bongo, MD, 10 mL at 04/23/19 0844 .  sodium chloride flush (NS) 0.9 % injection 10-40 mL, 10-40 mL, Intracatheter, Q12H, Agarwala, Ravi, MD, 20 mL at 04/24/19 2223 .  sodium chloride flush (NS) 0.9 % injection 10-40 mL, 10-40 mL, Intracatheter, PRN, Agarwala, Ravi, MD .  sodium chloride flush (NS) 0.9 % injection 3 mL, 3 mL, Intravenous, Q12H, Karmen Bongo, MD, 3 mL at 04/24/19 0831 .  thiamine 580m in normal saline (594m IVPB, 500 mg, Intravenous, TID, LiKerney ElbeMD, Last Rate: 100 mL/hr at 04/25/19 0932, 500 mg at 04/25/19 0932 .  vancomycin (VANCOCIN) IVPB 1000 mg/200 mL premix, 1,000 mg, Intravenous, Q8H, Regalado, Belkys A, MD, Stopped at 04/25/19 0705 Labs CBC    Component Value Date/Time   WBC 5.7 04/25/2019 0532   RBC 2.79 (L) 04/25/2019 0532   HGB 8.1 (L) 04/25/2019 0532   HCT 25.6 (L) 04/25/2019 0532   PLT 190 04/25/2019 0532   MCV 91.8 04/25/2019 0532   MCH 29.0 04/25/2019 0532   MCHC 31.6 04/25/2019 0532   RDW 15.9 (H) 04/25/2019 0532   LYMPHSABS 1.7 04/25/2019 0532   MONOABS 0.7 04/25/2019 0532   EOSABS 0.2 04/25/2019 0532   BASOSABS 0.0 04/25/2019 0532    CMP     Component Value Date/Time   NA 143 04/25/2019 0532   K 3.0 (L) 04/25/2019 0532   CL 102 04/25/2019 0532   CO2 33 (H) 04/25/2019 0532   GLUCOSE 123 (H) 04/25/2019 0532   BUN <5 (L) 04/25/2019 0532   CREATININE 0.56 04/25/2019 0532   CALCIUM 7.6 (L) 04/25/2019 0532   PROT 4.4 (L) 04/25/2019 0532   ALBUMIN 2.1 (L) 04/25/2019 0532   AST 24 04/25/2019 0532   ALT  21 04/25/2019 0532   ALKPHOS 85 04/25/2019 0532   BILITOT 0.4 04/25/2019 0532   GFRNONAA >60 04/25/2019 0532   GFRAA >60 04/25/2019 0532  CSF glucose 94, protein 42, WBC 3 HSV PCR pending at this time. Crypto negative  Imaging I have reviewed images in epic and the results pertinent to this consultation are: MRI of the brain unremarkable for any acute process an MRI of the head did not reveal any acute vascular occlusion malformation or stenosis.  LTM EEG overnight with generalized polymorphic delta slowing suggestive of severe encephalopathy but medication effect such as benzos and propofol could not be excluded.  No seizure captured  Assessment: [age]ear old woman 1 week history of gait unsteadiness worsening confusion pneumonia, initial concern for COVID but negative tests x3, with persistent encephalopathy and normal mental status. Has significant history of alcohol abuse and psychiatric history.  Also had abnormally increased salicylate levels-history of excessive Goody powder use.  Salicylate levels  started to normalize and are less than 7.0 today but the patient continues to be encephalopathic. She was very uncooperative for testing and had to be intubated for MRI EEG and a spinal tap  Possible etiologies for her current presentation could be as below: - Aspiration pneumonia or other atypical pneumonia -Less likely a CNS infection given normal glucose protein and cell count in the CSF.  HSV PCR pending but that was unlikely with the current CSF picture. - Salicylate toxicity - Drug overdose although urinary toxicology screen was negative for amphetamines, barbiturates, benzodiazepines, opiates, cocaine or THC.-It is not a perfect screen. - Less likely an autoimmune encephalitis given CSF findings as well.  Impression: Multifactorial toxic metabolic encephalopathy Less likely CNS infection-HSV PCR and CSF pending  Recommendations: -Discontinue meningitic coverage antibiotics.  Continue acyclovir till HSV PCR results come back and can discontinue that if negative -Minimize sedation as much as possible and tolerated while keeping the patient on LTM in case seizures might be the etiology again less likely but cannot rule out. -Treatment of pneumonia per primary team as you are -Management of salicylate toxicity per priamry team -Cannot rule out other drug toxicities or sympathetic/parasympathetic abnormalities from a list of her psych medications. We will continue to follow with you Plan discussed with Dr. Lynetta Mare in the unit   -- Amie Portland, MD [facility] Pager: [phone] If 7pm to 7am, please call on call as listed on AMION.  CRITICAL CARE ATTESTATION Performed by: Amie Portland, MD Total critical care time: 30 minutes Critical care time was exclusive of separately billable procedures and treating other patients and/or supervising APPs/Residents/Students Critical care was necessary to treat or prevent imminent or life-threatening deterioration due to multifactorial toxic metabolic encephalopathy This patient is critically ill and at significant risk for neurological worsening and/or death and care requires constant monitoring. Critical care was time spent personally by me on the following activities: development of treatment plan with patient and/or surrogate as well as nursing, discussions with consultants, evaluation of patient's response to treatment, examination of patient, obtaining history from patient or surrogate, ordering and performing treatments and interventions, ordering and review of laboratory studies, ordering and review of radiographic studies, pulse oximetry, re-evaluation of patient's condition, participation in multidisciplinary rounds and medical decision making of high complexity in the care of this patient.

## 2019-04-25 NOTE — Progress Notes (Signed)
LTM Maint. Completed re hooked with same leads. No skin breakdown.

## 2019-04-25 NOTE — Progress Notes (Signed)
Initial Nutrition Assessment  DOCUMENTATION CODES:   Not applicable  INTERVENTION:   Initiate Vital AF 1.2 @ 30 ml/hr and increase by 10 ml every 12 hours to goal rate of 60 ml/hr 30 ml Prostat daily MVI daily   Provides: 1828 kcal, 123 grams protein, and 1167 ml free water.   Vitamin C and B6 labs pending  Monitor magnesium and phosphorus every 12 hours x 4 occurences, MD to replete as needed, as pt is at risk for refeeding syndrome given severe ETOH dependence.  NUTRITION DIAGNOSIS:   Inadequate oral intake related to inability to eat as evidenced by NPO status.  GOAL:   Patient will meet greater than or equal to 90% of their needs  MONITOR:   TF tolerance  REASON FOR ASSESSMENT:   Consult, Ventilator Enteral/tube feeding initiation and management  ASSESSMENT:   Pt with PMH of PTSD, bipolar d/o, severe ETOH dependence and hypothyroidism who is admitted with AMS, multifocal PNA noted, pt COVID - x 3. Neurology following for multifactorial toxic metabolic encephalopathy.  Patient is currently intubated on ventilator support MV: 9.3 L/min Temp (24hrs), Avg:99 F (37.2 C), Min:97.7 F (36.5 C), Max:101.4 F (38.6 C)  Propofol: stopped Medications reviewed and include: 40 mEq KCl BID, 500 mg thiamine TID IV x 4 days Labs reviewed: K+ 3 (L) Admission weight: 86.6 kg  Pt is positive 3 L; mild edema    NUTRITION - FOCUSED PHYSICAL EXAM:  Deferred   Diet Order:   Diet Order            Diet NPO time specified  Diet effective now              EDUCATION NEEDS:   No education needs have been identified at this time  Skin:  Skin Assessment: Reviewed RN Assessment  Last BM:  7/15  Height:   Ht Readings from Last 1 Encounters:  04/22/19 5\' 4"  (1.626 m)    Weight:   Wt Readings from Last 1 Encounters:  04/24/19 89.1 kg    Ideal Body Weight:  54.5 kg  BMI:  Body mass index is 33.72 kg/m.  Estimated Nutritional Needs:   Kcal:   1850  Protein:  110-120 grams  Fluid:  > 1.8 L/day  Maylon Peppers RD, LDN, CNSC [phone] Pager [phone] After Hours Pager

## 2019-04-25 NOTE — Progress Notes (Signed)
NAME:  Mary CharterCheryl Dziedzic, MRN:  [id number], DOB:  03-12-1966, LOS: 3 ADMISSION DATE:  04/21/2019, CONSULTATION DATE:  04/24/2019 REFERRING MD: Hartley BarefootBelkys Regalado, CHIEF COMPLAINT:  AMS  Brief History    Mary Dodson is a 53 y.o. female with medical history significant of PTSD, bipolar d/o, ETOH dependence, and hypothyroidism who presented with AMS, fever, and agitation consistent wit acute encephalopathy. Patient also found to have Acute on Chronic respiratory failure with hypoxia requiring intubation. PCCM consulted for vent management.  History of present illness    Mary Dodson is a 53 y.o. female with medical history significant of PTSD; bipolar d/o; ETOH dependence; and hypothyroidism presenting with AMS.  She was seen by neurology in 6/20 for episodic headache with confusion/delirium. She had a normal awake and asleep EEG on 6/17.  EMS was called on 7/12 by her friend due to concerns of recurrent falls, AMS.  She was seen  by PA Joy, and the patient was alert enough to answer questions (in a belligerent and somewhat nonsensical manner).  In the ED, there was concern for sepsis and she was given a 1L bolus of LR and Rocephin/Azithromycin after she had fever. She was found to have an elevated salicylate level and Poison Control was contacted. They recommended calling nephrology (dialysis not indicated) and following the level.  She was started on a bicarb drip mixed with KCl and D5W.  CXR showed B PNA and O2 sats dropped to 90% so she was started on 2L O2.  She required Ativan and Geodon IM in ED due to periodic severe agitation.  She pulled out all of her tubes (foley, multiple IVs).   Of note, she has complained of severe headaches for the last few weeks.  She has complained of feeling like she was in a fog.  She "comes in contact with those people who got that COVID infection."  She said she was taking lots of BC powders.  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  She goes to AA - hasn't had "nothing in about 3 years."    Past Medical History  - Alcohol use disorder, severe dependence - Bipolar disorder, current episode manic severe with psychotic features  - Hypothyroidism (acquired) - PTSD Significant Hospital Events    04/22/2019 admitted 04/24/2019 ETT Consults:  Nephrology Neurology PCCM  Procedures:  ETT 04/24/2019 >> NGT 04/24/2019>> Continuous Video EEG 7/16 to 04/25/19 >  This is an abnormal EEG due to the presence of generalized polymorphic delta slowing, suggesting severe encephalopathy, but medication effect (such as benzodiazepine, propofol) could not be excluded.  No epileptiform discharge or seizure was captured Significant Diagnostic Tests:  MRSA PCR > negative SARS-CoV-2 > negative x3   MR Brain 04/24/2019 MR Angio Head wo contrast 04/24/2019 IMPRESSION: Negative MRI head with contrast. Negative MRA head.   CXR 04/24/2019 IMPRESSION: NG tube folds on itself in the distal esophagus with the tip off the superior aspect of the film. Patchy bilateral airspace disease, left greater than right, similar to prior study.  CT Head Wo Contrast 04/22/2019 IMPRESSION: No acute intracranial or cervical spine finding.  CT Cervical Spine Wo Contrast 04/22/2019 IMPRESSION: No acute intracranial or cervical spine finding.  CXR 04/21/2019 Impression: Patchy and indistinct bilateral pulmonary opacity suspicious for bilateral pneumonia. Consider viral/atypical etiology. No pleural effusion. Micro Data:  UCx 7/14 >  recollect (dirty sample) BCx 7/13 > NGTD UCx 7/16> Resp Cx 7/16> CSF Cx with gram stain > Antimicrobials:  Azithromycin - 7/13>>  Ceftriaxone - 7/13 >> Vancomycin - 7/15>>  Antivirals:  Acyclovir - 7/15>>   Interim history/subjective:  - Patient is sedated and s/p intubation. No acute changes  overnight.  Objective   Blood pressure (!) 97/56, pulse (!) 51, temperature (!) 101.4 F (38.6 C), temperature source Oral, resp. rate 20, height 5\' 4"  (1.626 m), weight 89.1 kg, SpO2 100 %, unknown if currently breastfeeding.    Vent Mode: PRVC FiO2 (%):  [40 %-100 %] 40 % Set Rate:  [16 bmp-20 bmp] 20 bmp Vt Set:  [430 mL] 430 mL PEEP:  [5 cmH20] 5 cmH20 Plateau Pressure:  [19 cmH20-24 cmH20] 20 cmH20   Intake/Output Summary (Last 24 hours) at 04/25/2019 0823 Last data filed at 04/25/2019 0800 Gross per 24 hour  Intake 4276.85 ml  Output 3500 ml  Net 776.85 ml   Filed Weights   04/22/19 1147 04/23/19 0346 04/24/19 0500  Weight: 86.6 kg 85.7 kg 89.1 kg    Examination: General: lying in bed NAD. Does not open eyes to voice. HENT: NCAT, MMM, ETT in place Lungs: Course rhonchi bilaterally, Nl WOB on mechanical ventilation Cardiovascular: RRR, Nl S1/S2, no m/r/g Abdomen: Soft, NTND, BS+ x4 quadrants Extremities: no peripheral edema. Peripheral pulses 2+ bilaterally in UEs and LEs Neuro: Sedated. Sensation, coordination and gait not tested. Localizes to stimulation in BLEs and LUE GU: Foley in place Skin: abrasions under both R and L knee. Bruising under both eyes. Circular ecchymosis on lateral thigh of RLE - approx 3mm.  Resolved Hospital Problem list   X  Assessment & Plan:   #Neuro Acute encephalopathy - Neurology following, appreciate rec's - UDS is negative, but positive for salicylate toxicity (see below) - Concern for toxic encephalopathy vs. HSV encephalitis vs Meningitis. - COVID encephalitis less likely given negative testing x3 despite supportive labs normal WBC count;  increased LFTs; low procalcitonin; markedly elevated D-dimer (>1); markedly elevated CRP (>7) - Negative MRI head with contrast. Negative MRA head. - Abnormal EEG due to the presence of generalized polymorphic delta slowing, suggesting severe encephalopathy, but medication effect (such as  benzodiazepine, propofol) could not be excluded.  No epileptiform discharge or seizure was captured Plan - Continue sched propofol, RASS goal -2 given agitation - Continue Acyclovir (Start 7/15 >> ) to cover HSV encephalitis, de-escalate pending labs. - Continue CIWA protocol - Continue IV thiamine start 7/15>7/17, for thiamine deficiency in the setting of  - f/u UCx, Resp Cx, CSF Cx with gram stain given concern for infectious encephalopathy - f/u EEG  #RESPIRATORY Acute on chronic respiratory failure with hypoxia - S/p Intubation on 7/16 for airway protection and maintenance.  - CXR with multifocal opacities (L>R) which may be c/w Multifocal PNA - She does have a reported h/o COPD and so it is possible that her hypoxia is more chronic in nature, but her ABG did appreciate mild hypoxia without hypercapnia Plan: - Continue mechanical ventilation, SBT as tolerated  #ID - Does not meet SIRS criteria - Patient with fever to 103 while in the ER with hypoxia. 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 - CXR with multifocal opacities concerning for Multifocal PNA Plan: - Continue ceftriaxone and azithromycin and Vancomycin, de-escalate pending labs.  - f/u UCx, Resp Cx,  CSF Cx with gram stain given concern for infectious source  #HEM  Normocytic anemia - Hgb 11.2>9.3>8.1 today and Hct 33>28.5>25.6 today, normal MCV. Ferritin wnl. Plan - CTM H/H, transfuse if Hgb<7  #METABOLIC ASA toxicity likely 2/2 to [facility] powder use - H/o taking BC headache powders at home - Salicylate level was 41 at presentation and has since decreased to non-toxic levels - Nephrology signed off, as it does not appear that the patient will require dialysis. Recommends supportive care. Plan: - CTM CMP  Hypokalemia - Replete  ETOH dependence -Patient has reported long-standing h/o ETOH  dependence -Not clear when her last drink was. ETOH level was negative on presentation Plan - Continue CIWA protocol  #ENDOCRINE Hypothyroidism -Her TSH is suppressed, but her free T4 is low rather than high -Suspect sick euthyroid rather than true thyroid dysfunction at this time -Normal thyroid testing in 6/19 Plan: - Start synthroid  #PSYCH Bipolar d/o Plan - Holding home meds, continue when she is able to take PO - Continue Cogentin and Gabapentin - Hold Doxepin and Zoloft given concern for serotonin syndrome   Best practice:  Diet: NPO Pain/Anxiety/Delirium protocol (if indicated): Propofol sch, Fentanyl PRN  VAP protocol (if indicated): NA DVT prophylaxis: Lovenox GI prophylaxis: PPI Glucose control: None Code Status: FULL Family Communication: NA  Disposition: ICU   Critical care time: 35 min     Donney Caraveo Chief Technology Officer IV, MS - Administrator, arts

## 2019-04-25 NOTE — Procedures (Signed)
Continuous Video-EEG Monitoring Report  Study Duration:  04/24/2019 16:25 to 04/25/2019 09:00 CPT Code:   69629 Diagnosis and Code: Altered mental status (R41.82)  History: This is a 54 year old female presenting with altered mental status.  Continuous video-EEG monitoring was performed to evaluate for seizures.  Technical Details:  Long-term video-EEG monitoring was performed using standard setting per the guidelines.  Briefly, a minimum of 21 electrodes were placed on scalp according to the International 10-20 or/and 10-10 Systems.  Supplemental electrodes were placed as needed.  Single EKG electrode was also used to detect cardiac arrhythmia.  Patient's behavior was continuously recorded on video simultaneously with EEG.  A minimum of 16 channels were used for data display.  Each epoch of study was reviewed manually daily and as needed using standard referential and bipolar montages.    EEG Description:  There was generalized polymorphic delta slowing.  No posterior dominant rhythm or sleep architecture was seen.  Diffuse beta activity was present, most likely due to medication effect (such as benzodiazepine, propofol).  No epileptiform discharge or seizure was captured.   Impression:  This is an abnormal EEG due to the presence of generalized polymorphic delta slowing, suggesting severe encephalopathy, but medication effect (such as benzodiazepine, propofol) could not be excluded.  No epileptiform discharge or seizure was captured.    Reading Physician: Winfield Cunas, MD, PhD

## 2019-04-26 DIAGNOSIS — G9340 Encephalopathy, unspecified: Secondary | ICD-10-CM

## 2019-04-26 LAB — VITAMIN B1: Vitamin B1 (Thiamine): 209.2 nmol/L — ABNORMAL HIGH (ref 66.5–200.0)

## 2019-04-26 LAB — CBC WITH DIFFERENTIAL/PLATELET
Abs Immature Granulocytes: 0.03 10*3/uL (ref 0.00–0.07)
Basophils Absolute: 0 10*3/uL (ref 0.0–0.1)
Basophils Relative: 0 %
Eosinophils Absolute: 0.3 10*3/uL (ref 0.0–0.5)
Eosinophils Relative: 6 %
HCT: 28.7 % — ABNORMAL LOW (ref 36.0–46.0)
Hemoglobin: 8.9 g/dL — ABNORMAL LOW (ref 12.0–15.0)
Immature Granulocytes: 1 %
Lymphocytes Relative: 28 %
Lymphs Abs: 1.6 10*3/uL (ref 0.7–4.0)
MCH: 29.5 pg (ref 26.0–34.0)
MCHC: 31 g/dL (ref 30.0–36.0)
MCV: 95 fL (ref 80.0–100.0)
Monocytes Absolute: 0.6 10*3/uL (ref 0.1–1.0)
Monocytes Relative: 11 %
Neutro Abs: 3 10*3/uL (ref 1.7–7.7)
Neutrophils Relative %: 54 %
Platelets: 230 10*3/uL (ref 150–400)
RBC: 3.02 MIL/uL — ABNORMAL LOW (ref 3.87–5.11)
RDW: 15.7 % — ABNORMAL HIGH (ref 11.5–15.5)
WBC: 5.5 10*3/uL (ref 4.0–10.5)
nRBC: 0 % (ref 0.0–0.2)

## 2019-04-26 LAB — COMPREHENSIVE METABOLIC PANEL
ALT: 21 U/L (ref 0–44)
AST: 26 U/L (ref 15–41)
Albumin: 2.1 g/dL — ABNORMAL LOW (ref 3.5–5.0)
Alkaline Phosphatase: 78 U/L (ref 38–126)
Anion gap: 7 (ref 5–15)
BUN: 5 mg/dL — ABNORMAL LOW (ref 6–20)
CO2: 29 mmol/L (ref 22–32)
Calcium: 8.3 mg/dL — ABNORMAL LOW (ref 8.9–10.3)
Chloride: 109 mmol/L (ref 98–111)
Creatinine, Ser: 0.65 mg/dL (ref 0.44–1.00)
GFR calc Af Amer: >60 mL/min (ref >60)
GFR calc non Af Amer: >60 mL/min (ref >60)
Glucose, Bld: 103 mg/dL — ABNORMAL HIGH (ref 70–99)
Potassium: 4.4 mmol/L (ref 3.5–5.1)
Sodium: 145 mmol/L (ref 135–145)
Total Bilirubin: <0.1 mg/dL — ABNORMAL LOW (ref 0.3–1.2)
Total Protein: 4.9 g/dL — ABNORMAL LOW (ref 6.5–8.1)

## 2019-04-26 LAB — GLUCOSE, CAPILLARY
Glucose-Capillary: 92 mg/dL (ref 70–99)
Glucose-Capillary: 70 mg/dL (ref 70–99)
Glucose-Capillary: 107 mg/dL — ABNORMAL HIGH (ref 70–99)
Glucose-Capillary: 88 mg/dL (ref 70–99)
Glucose-Capillary: 95 mg/dL (ref 70–99)
Glucose-Capillary: 78 mg/dL (ref 70–99)

## 2019-04-26 LAB — CULTURE, RESPIRATORY W GRAM STAIN: Culture: NORMAL

## 2019-04-26 LAB — HEPATITIS PANEL, ACUTE
HCV Ab: <0.1 s/co ratio (ref 0.0–0.9)
Hep A IgM: NEGATIVE
Hep B C IgM: NEGATIVE
Hepatitis B Surface Ag: NEGATIVE

## 2019-04-26 LAB — FOLLICLE STIMULATING HORMONE: FSH: 20.4 m[IU]/mL

## 2019-04-26 LAB — HSV DNA BY PCR (REFERENCE LAB)
HSV 1 DNA: NEGATIVE
HSV 2 DNA: NEGATIVE

## 2019-04-26 LAB — CULTURE, BLOOD (ROUTINE X 2)
Culture: NO GROWTH
Special Requests: ADEQUATE

## 2019-04-26 LAB — MAGNESIUM
Magnesium: 2 mg/dL (ref 1.7–2.4)
Magnesium: 1.9 mg/dL (ref 1.7–2.4)

## 2019-04-26 LAB — PHOSPHORUS
Phosphorus: 3.8 mg/dL (ref 2.5–4.6)
Phosphorus: 5.2 mg/dL — ABNORMAL HIGH (ref 2.5–4.6)

## 2019-04-26 LAB — TRIGLYCERIDES: Triglycerides: 124 mg/dL (ref <150)

## 2019-04-26 LAB — LUTEINIZING HORMONE: LH: 5.3 m[IU]/mL

## 2019-04-26 MED ORDER — FOLIC ACID 1 MG PO TABS
1.0000 mg | ORAL_TABLET | Freq: Every day | ORAL | Status: DC
  Administered 2019-04-27: 1 mg via ORAL
  Filled 2019-04-26: qty 1

## 2019-04-26 MED ORDER — VITAMIN B-1 100 MG PO TABS
100.0000 mg | ORAL_TABLET | Freq: Every day | ORAL | Status: DC
  Administered 2019-04-27: 100 mg via ORAL
  Filled 2019-04-26: qty 1

## 2019-04-26 MED ORDER — DEXMEDETOMIDINE HCL IN NACL 200 MCG/50ML IV SOLN
0.4000 ug/kg/h | INTRAVENOUS | Status: DC
  Administered 2019-04-26 (×2): 0.4 ug/kg/h via INTRAVENOUS
  Administered 2019-04-26: 0.2 ug/kg/h via INTRAVENOUS
  Administered 2019-04-27: 0.3 ug/kg/h via INTRAVENOUS
  Administered 2019-04-27 (×2): 0.5 ug/kg/h via INTRAVENOUS
  Filled 2019-04-26 (×6): qty 50

## 2019-04-26 MED ORDER — ORAL CARE MOUTH RINSE: 15.0000 mL | OROMUCOSAL | Status: DC

## 2019-04-26 MED ORDER — CHLORHEXIDINE GLUCONATE 0.12% ORAL RINSE (MEDLINE KIT)
15.0000 mL | Freq: Two times a day (BID) | OROMUCOSAL | Status: DC
  Administered 2019-04-26 – 2019-04-27 (×3): 15 mL via OROMUCOSAL

## 2019-04-26 NOTE — Plan of Care (Signed)
  Problem: Clinical Measurements: Goal: Ability to maintain clinical measurements within normal limits will improve Outcome: Progressing   Problem: Safety: Goal: Ability to remain free from injury will improve Outcome: Progressing   

## 2019-04-26 NOTE — Progress Notes (Signed)
Neurology Progress Note   S:// Patient seen and examined Off of sedation Moving all extremities and following commands in bed.   O:// Current vital signs: BP 115/74   Pulse 84   Temp 98.4 F (36.9 C) (Axillary)   Resp 18   Ht '5\' 4"'  (1.626 m)   Wt 94.3 kg   SpO2 100%   BMI 35.68 kg/m  Vital signs in last 24 hours: Temp:  [98.4 F (36.9 C)-100.6 F (38.1 C)] 98.4 F (36.9 C) (07/18 0800) Pulse Rate:  [60-101] 84 (07/18 0800) Resp:  [16-32] 18 (07/18 0802) BP: (82-156)/(44-85) 115/74 (07/18 0802) SpO2:  [79 %-100 %] 100 % (07/18 0800) FiO2 (%):  [40 %] 40 % (07/18 0731) Weight:  [94.3 kg] 94.3 kg (07/18 0500) General: Patient is intubated, no sedation, slightly agitated in bed HEENT: Normocephalic, atraumatic, endotracheal tube in place, bruising around the eyes testicle is still present. CVS: Regular rate rhythm Respiratory: Vented Abdomen: Nondistended nontender Neurological exam Not on any sedation but intubated Attempting to reach to the tube to remove it. Follows all commands. Asked to close her eyes and open her eyes-she followed.  Asked her to raise her arms that she followed promptly. CNS: Pupils equal round reactive light, extraocular movements intact, visual fields are full, facial symmetry is difficult to ascertain due to the endotracheal tube but appears grossly symmetric. Motor exam: Antigravity in all fours. Sensory exam: Intact light touch Coordination difficult to perform due to patient agitation and attempting to reach the cube Gait testing cannot be done due to her current medical condition.  Medications  Current Facility-Administered Medications:  .  acetaminophen (TYLENOL) solution 650 mg, 650 mg, Per Tube, Q6H PRN, Kipp Brood, MD, 650 mg at 04/25/19 2000 .  acyclovir (ZOVIRAX) 670 mg in dextrose 5 % 100 mL IVPB, 10 mg/kg (Adjusted), Intravenous, Q8H, Regalado, Belkys A, MD, Last Rate: 113.4 mL/hr at 04/26/19 0805, 670 mg at 04/26/19 0805 .   albuterol (VENTOLIN HFA) 108 (90 Base) MCG/ACT inhaler 2 puff, 2 puff, Inhalation, Q6H PRN, Karmen Bongo, MD .  benztropine (COGENTIN) tablet 0.5 mg, 0.5 mg, Per Tube, QHS, Agarwala, Ravi, MD, 0.5 mg at 04/25/19 2145 .  bisacodyl (DULCOLAX) EC tablet 5 mg, 5 mg, Oral, Daily PRN, Karmen Bongo, MD .  cefTRIAXone (ROCEPHIN) 2 g in sodium chloride 0.9 % 100 mL IVPB, 2 g, Intravenous, Q24H, Amie Portland, MD, Stopped at 04/26/19 0418 .  chlorhexidine gluconate (MEDLINE KIT) (PERIDEX) 0.12 % solution 15 mL, 15 mL, Mouth Rinse, BID, Agarwala, Ravi, MD, 15 mL at 04/26/19 0806 .  Chlorhexidine Gluconate Cloth 2 % PADS 6 each, 6 each, Topical, Daily, Kipp Brood, MD, 6 each at 04/24/19 2100 .  docusate (COLACE) 50 MG/5ML liquid 100 mg, 100 mg, Per Tube, BID PRN, Ollis, Brandi L, NP .  feeding supplement (PRO-STAT SUGAR FREE 64) liquid 30 mL, 30 mL, Per Tube, Daily, Agarwala, Ravi, MD, 30 mL at 04/25/19 1648 .  feeding supplement (VITAL AF 1.2 CAL) liquid 1,000 mL, 1,000 mL, Per Tube, Continuous, Agarwala, Ravi, MD, Last Rate: 60 mL/hr at 04/25/19 1651, 1,000 mL at 04/25/19 1651 .  fentaNYL (SUBLIMAZE) bolus via infusion 50 mcg, 50 mcg, Intravenous, Q15 min PRN, Ollis, Brandi L, NP .  fentaNYL (SUBLIMAZE) injection 50 mcg, 50 mcg, Intravenous, Once, Ollis, Brandi L, NP .  fentaNYL 2573mg in NS 2530m(1061mml) infusion-PREMIX, 50-200 mcg/hr, Intravenous, Continuous, Ollis, Brandi L, NP, Last Rate: 15 mL/hr at 04/26/19 0800, 150 mcg/hr at 04/26/19 0800 .  gabapentin (NEURONTIN) 300 MG/6ML solution 400 mg, 400 mg, Per Tube, Q6H, Agarwala, Ravi, MD, 400 mg at 04/26/19 0514 .  levothyroxine (SYNTHROID) tablet 50 mcg, 50 mcg, Per Tube, Daily, Agarwala, Ravi, MD, 50 mcg at 04/25/19 1025 .  MEDLINE mouth rinse, 15 mL, Mouth Rinse, 10 times per day, Kipp Brood, MD, 15 mL at 04/26/19 0626 .  multivitamin with minerals tablet 1 tablet, 1 tablet, Per Tube, Daily, Kipp Brood, MD, 1 tablet at 04/25/19  1649 .  ondansetron (ZOFRAN) tablet 4 mg, 4 mg, Per Tube, Q6H PRN **OR** ondansetron (ZOFRAN) injection 4 mg, 4 mg, Intravenous, Q6H PRN, Agarwala, Ravi, MD .  pantoprazole sodium (PROTONIX) 40 mg/20 mL oral suspension 40 mg, 40 mg, Per Tube, Q1200, Ollis, Brandi L, NP, 40 mg at 04/25/19 1235 .  polyethylene glycol (MIRALAX / GLYCOLAX) packet 17 g, 17 g, Per Tube, Daily PRN, Agarwala, Ravi, MD .  propofol (DIPRIVAN) 1000 MG/100ML infusion, 0-50 mcg/kg/min, Intravenous, Continuous, Kipp Brood, MD, Stopped at 04/26/19 0757 .  sodium chloride flush (NS) 0.9 % injection 10-40 mL, 10-40 mL, Intracatheter, Q12H, Karmen Bongo, MD, 10 mL at 04/25/19 1028 .  sodium chloride flush (NS) 0.9 % injection 10-40 mL, 10-40 mL, Intracatheter, PRN, Karmen Bongo, MD, 10 mL at 04/23/19 0844 .  sodium chloride flush (NS) 0.9 % injection 10-40 mL, 10-40 mL, Intracatheter, Q12H, Agarwala, Ravi, MD, 20 mL at 04/24/19 2223 .  sodium chloride flush (NS) 0.9 % injection 10-40 mL, 10-40 mL, Intracatheter, PRN, Agarwala, Ravi, MD .  sodium chloride flush (NS) 0.9 % injection 3 mL, 3 mL, Intravenous, Q12H, Karmen Bongo, MD, 3 mL at 04/25/19 2200 Labs CBC    Component Value Date/Time   WBC 5.5 04/26/2019 0511   RBC 3.02 (L) 04/26/2019 0511   HGB 8.9 (L) 04/26/2019 0511   HCT 28.7 (L) 04/26/2019 0511   PLT 230 04/26/2019 0511   MCV 95.0 04/26/2019 0511   MCH 29.5 04/26/2019 0511   MCHC 31.0 04/26/2019 0511   RDW 15.7 (H) 04/26/2019 0511   LYMPHSABS 1.6 04/26/2019 0511   MONOABS 0.6 04/26/2019 0511   EOSABS 0.3 04/26/2019 0511   BASOSABS 0.0 04/26/2019 0511    CMP     Component Value Date/Time   NA 145 04/26/2019 0511   K 4.4 04/26/2019 0511   CL 109 04/26/2019 0511   CO2 29 04/26/2019 0511   GLUCOSE 103 (H) 04/26/2019 0511   BUN 5 (L) 04/26/2019 0511   CREATININE 0.65 04/26/2019 0511   CALCIUM 8.3 (L) 04/26/2019 0511   PROT 4.9 (L) 04/26/2019 0511   ALBUMIN 2.1 (L) 04/26/2019 0511   AST 26  04/26/2019 0511   ALT 21 04/26/2019 0511   ALKPHOS 78 04/26/2019 0511   BILITOT <0.1 (L) 04/26/2019 0511   GFRNONAA >60 04/26/2019 0511   GFRAA >60 04/26/2019 0511  HIV negative, crypto antigen in CSF negative Hepatitis panel negative  CSF with glucose 94, protein 42, 3 WBCs.  HSV PCR pending at this time. Imaging I have reviewed images in epic and the results pertinent to this consultation are: MRI of the brain with no acute changes.  And MR a with no vessel problems.   Assessment: 53 year old woman with 1 week history of gait unsteadiness, worsening confusion, pneumonia with initial concern for COVID-19 but 3- tests, with persistent encephalopathy and abnormal mental status evaluated by neurology. She has significant history of alcohol abuse and multiple psychiatric admissions in the past in [place] according to the friend who I  spoke with over the phone. Also had elevated salicylate levels-has a history of excessive Goody powder use which she had been doing prior to admission as well. Salicylate levels are now normal Spinal tap unremarkable for an acute infection of bacterial nature, low suspicion for viral as well but HSV PCR pending. Due to her agitation and noncooperation for exams, she had to be intubated to obtain EEG, MRI and spinal tap. All 3 have been unremarkable thus far-spinal tap results as above, MRI negative for acute process and EEG only showed severe encephalopathy-likely due to medications for sedation.  I suspect that she presented secondary to salicylate toxicity and or other toxic metabolic derangements and is now coming back to baseline.   Impression: Toxic metabolic encephalopathy Aspiration pneumonia Less likely CNS infection Less likely autoimmune encephalitis in the presence of just 3 cells and normal protein.  Recommendations: Supportive treatment per PCCM Management of pneumonia per PCCM Given her better exam, will discontinue LTM unless the read as  concerning focal irritabilities. Spoke with PCCM attending on the unit- okay to extubate from a neurological standpoint once ready from the pulmonary standpoint.  We will follow with you   -- Amie Portland, MD [facility] Pager: [phone] If 7pm to 7am, please call on call as listed on AMION.  CRITICAL CARE ATTESTATION Performed by: Amie Portland, MD Total critical care time: 30 minutes Critical care time was exclusive of separately billable procedures and treating other patients and/or supervising APPs/Residents/Students Critical care was necessary to treat or prevent imminent or life-threatening deterioration due to toxic metabolic encephalopathy This patient is critically ill and at significant risk for neurological worsening and/or death and care requires constant monitoring. Critical care was time spent personally by me on the following activities: development of treatment plan with patient and/or surrogate as well as nursing, discussions with consultants, evaluation of patient's response to treatment, examination of patient, obtaining history from patient or surrogate, ordering and performing treatments and interventions, ordering and review of laboratory studies, ordering and review of radiographic studies, pulse oximetry, re-evaluation of patient's condition, participation in multidisciplinary rounds and medical decision making of high complexity in the care of this patient.

## 2019-04-26 NOTE — Procedures (Signed)
Continuous Video-EEG Monitoring Report  Study Duration:  04/25/2019 0900 to 04/26/2019 10:54 hours CPT Code:   49201 Diagnosis and Code: Altered mental status (R41.82)  History: This is a 53 year old female presenting with altered mental status.  Continuous video-EEG monitoring was performed to evaluate for seizures.  Technical Details:  Long-term video-EEG monitoring was performed using standard setting per the guidelines.  Briefly, a minimum of 21 electrodes were placed on scalp according to the International 10-20 or/and 10-10 Systems.  Supplemental electrodes were placed as needed.  Single EKG electrode was also used to detect cardiac arrhythmia.  Patient's behavior was continuously recorded on video simultaneously with EEG.  A minimum of 16 channels were used for data display.  Each epoch of study was reviewed manually daily and as needed using standard referential and bipolar montages.    EEG Description:  There was generalized polymorphic delta slowing, with intermixed theta activity reaching about 6Hz  in the later part of the recording.  No posterior dominant rhythm or sleep architecture was seen.  There were no pushbutton events during the recording.  No epileptiform activity was seen.  Impression:  This is an abnormal EEG due to the presence of generalized polymorphic delta slowing, suggesting severe encephalopathy. No epileptiform discharge or seizure was captured. The interpretation was somewhat limited by the presence of overriding muscle artifact.     Reading Physician: Beverly Milch, MD

## 2019-04-26 NOTE — Procedures (Signed)
Extubation Procedure Note  Patient Details:   Name: Mary Dodson DOB: Jan 11, 1966 MRN: [id number]   Airway Documentation:  Airway 7.5 mm (Active)  Secured at (cm) 23 cm 04/26/19 0731  Measured From Lips 04/26/19 0800  Secured Location Right 04/26/19 0800  Secured By [employer] 04/26/19 0800  Tube Holder Repositioned Yes 04/26/19 0731  Cuff Pressure (cm H2O) 25 cm H2O 04/26/19 0731  Site Condition Dry 04/26/19 0800   Vent end date: 04/26/19 Vent end time: 0955   Evaluation  O2 sats: stable throughout Complications: No apparent complications Patient did tolerate procedure well. Bilateral Breath Sounds: Diminished  Pateint able to speak Yes  Rudene Re 04/26/2019, 9:57 AM

## 2019-04-26 NOTE — Progress Notes (Signed)
LTM EEG discontinued, no skin issues at [facility].

## 2019-04-26 NOTE — Progress Notes (Addendum)
NAME:  Mary Dodson, MRN:  [id number], DOB:  08/29/1966, LOS: 4 ADMISSION DATE:  04/21/2019, CONSULTATION DATE:  04/24/2019 REFERRING MD: Niel Hummer, CHIEF COMPLAINT:  AMS  Brief History    Mary Dodson is a 53 y.o. female with medical history significant of PTSD, bipolar d/o, ETOH dependence, and hypothyroidism who presented with AMS, fever, and agitation consistent wit acute encephalopathy. Patient also found to have Acute on Chronic respiratory failure with hypoxia requiring intubation. PCCM consulted for vent management.  History of present illness    Mary Dodson is a 53 y.o. female with medical history significant of PTSD; bipolar d/o; ETOH dependence; and hypothyroidism presenting with AMS.  She was seen by neurology in 6/20 for episodic headache with confusion/delirium. She had a normal awake and asleep EEG on 6/17.  EMS was called on 7/12 by her friend due to concerns of recurrent falls, AMS.  She was seen  by PA Joy, and the patient was alert enough to answer questions (in a belligerent and somewhat nonsensical manner).  In the ED, there was concern for sepsis and she was given a 1L bolus of LR and Rocephin/Azithromycin after she had fever. She was found to have an elevated salicylate level and Poison Control was contacted. They recommended calling nephrology (dialysis not indicated) and following the level.  She was started on a bicarb drip mixed with KCl and D5W.  CXR showed B PNA and O2 sats dropped to 90% so she was started on 2L O2.  She required Ativan and Geodon IM in ED due to periodic severe agitation.  She pulled out all of her tubes (foley, multiple IVs).   Of note, she has complained of severe headaches for the last few weeks.  She has complained of feeling like she was in a fog.  She "comes in contact with those people who got that COVID infection."  She said she was taking lots of BC powders.  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  She goes to [place] - hasn't had "nothing in about 3 years."    Past Medical History  - Alcohol use disorder, severe dependence - Bipolar disorder, current episode manic severe with psychotic features  - Hypothyroidism (acquired) - PTSD Significant Hospital Events    04/22/2019 admitted 04/24/2019 ETT 7/17 lumbar puncture, MRI  Consults:  Nephrology Neurology PCCM  Procedures:  ETT 04/24/2019 >> Lumbar puncture 04/25/2019  Significant Diagnostic Tests:  MRSA PCR > negative SARS-CoV-2 > negative x3  CT Head Wo Contrast 04/22/2019 IMPRESSION: No acute intracranial or cervical spine finding.  CT Cervical Spine Wo Contrast 04/22/2019 IMPRESSION: No acute intracranial or cervical spine finding.  CXR 04/21/2019 Impression: Patchy and indistinct bilateral pulmonary opacity suspicious for bilateral pneumonia. Consider viral/atypical etiology. No pleural Effusion.  MRI results 04/24/2019-reviewed  Micro Data:  UCx 7/14 > recollect (dirty sample) BCx 7/13 > NGTD UCx 7/16> Resp Cx 7/16> Antimicrobials:  Azithromycin - 7/13>>  Ceftriaxone - 7/13 >> Vancomycin - 7/15>>  Antivirals:  Acyclovir - 7/15>>   Interim history/subjective:  Off sedation Does not appear to be in respiratory distress  Objective   Blood pressure 115/74, pulse 84, temperature 98.4 F (36.9 C), temperature source Axillary, resp. rate 18, height 5\' 4"  (1.626 m), weight 94.3 kg, SpO2 100 %, unknown if currently breastfeeding.    Vent Mode: PRVC  FiO2 (%):  [40 %] 40 % Set Rate:  [20 bmp] 20 bmp Vt Set:  [430 mL] 430 mL PEEP:  [5 cmH20] 5 cmH20 Plateau Pressure:  [17 cmH20-21 cmH20] 20 cmH20   Intake/Output Summary (Last 24 hours) at 04/26/2019 0853 Last data filed at 04/26/2019 0825 Gross per 24 hour  Intake 1245.28 ml  Output 2485 ml  Net -1239.72 ml   Filed Weights   04/23/19  0346 04/24/19 0500 04/26/19 0500  Weight: 85.7 kg 89.1 kg 94.3 kg    Examination: Elderly lady, comfortable She does have few rhonchi bilaterally S1-S2 appreciated Bowel sounds appreciated No peripheral edema Some bruising under both eyelids  Resolved Hospital Problem list   X  Assessment & Plan:   Acute encephalopathy -Status post LP-not showing any signs of meningitis -Concern for COVID encephalitis-x3- testing -Started on acyclovir-to continue until PCR from LP becomes available -PRN Ativan  Acute on chronic respiratory failure with hypoxia -She was intubated for airway protection and for planned procedures -Did not require intubation prior to planned procedures -Multifocal infiltrates have been stable -Hypoxemia likely chronic -Will plan for extubation  Recent fever -COVID testing negative x3 -Fever has resolved -Leukocytosis resolved -Respiratory cultures not showing any organism -On ceftriaxone  Anemia -Trend hematocrit  Metabolic encephalopathy -Concern for salicylate poisoning -Levels have improved  Continue [place] protocol for EtOH dependent  History of hypothyroidism -Continue to hold Synthroid at present  Bipolar disorder -Holding home medications -We will reintroduce her medications as able-when able to take p.o.  Will extubate today  Best practice:  Diet: Hold tube feeds Pain/Anxiety/Delirium protocol (if indicated): Ativan sch, Fentanyl PRN  VAP protocol (if indicated): In place DVT prophylaxis: Lovenox GI prophylaxis: PPI Glucose control: None Code Status: FULL Family Communication: NA  Disposition: ICU  Labs   CBC: Recent Labs  Lab 04/21/19 2119  04/23/19 0357 04/24/19 1155 04/24/19 1233 04/24/19 1647 04/25/19 0532 04/26/19 0511  WBC 9.4  --  9.3 9.3  --   --  5.7 5.5  NEUTROABS 7.1  --  6.8 6.9  --   --  3.1 3.0  HGB 10.9*   < > 9.3* 9.0* 12.2 8.8* 8.1* 8.9*  HCT 33.4*   < > 28.5* 28.7* 36.0 26.0* 25.6* 28.7*  MCV 89.8   --  89.9 92.3  --   --  91.8 95.0  PLT 264  --  219 236  --   --  190 230   < > = values in this interval not displayed.    Basic Metabolic Panel: Recent Labs  Lab 04/22/19 0744 04/23/19 0357 04/23/19 2022 04/24/19 1155 04/24/19 1233 04/24/19 1647 04/25/19 0532 04/26/19 0511  NA 144 142  --  145 145 144 143 145  K 3.5 3.5  --  3.7 3.3* 3.5 3.0* 4.4  CL 114* 105  --  105  --   --  102 109  CO2 20* 27  --  32  --   --  33* 29  GLUCOSE 106* 193*  --  136*  --   --  123* 103*  BUN 12 <5*  --  <5*  --   --  <5* 5*  CREATININE 0.66 0.58  --  0.47  --   --  0.56 0.65  CALCIUM 7.4* 8.1*  --  8.0*  --   --  7.6* 8.3*  MG  --  1.7  --  1.7  --   --  1.7 2.0  PHOS 2.2* 1.4*  1.8* 2.5  --   --  2.9 3.8   GFR: Estimated Creatinine Clearance: 91.6 mL/min (by C-G formula based on SCr of 0.65 mg/dL). Recent Labs  Lab 04/21/19 2145 04/22/19 0104 04/23/19 0357 04/24/19 1155 04/25/19 0532 04/26/19 0511  PROCALCITON  --  <0.10  --   --   --   --   WBC  --   --  9.3 9.3 5.7 5.5  LATICACIDVEN 0.8 0.7  --   --   --   --     Liver Function Tests: Recent Labs  Lab 04/21/19 2119 04/22/19 0744 04/23/19 0357 04/24/19 1155 04/25/19 0532 04/26/19 0511  AST 117*  --  49* 34 24 26  ALT 22  --  22 22 21 21   ALKPHOS 73  --  71 68 85 78  BILITOT 0.6  --  0.4 0.2* 0.4 <0.1*  PROT 6.3*  --  5.6* 5.2* 4.4* 4.9*  ALBUMIN 3.4* 2.7* 2.8* 2.6* 2.1* 2.1*   No results for input(s): LIPASE, AMYLASE in the last 168 hours. Recent Labs  Lab 04/21/19 2121 04/22/19 1908  AMMONIA 26 25    ABG    Component Value Date/Time   PHART 7.447 04/24/2019 1647   PCO2ART 50.9 (H) 04/24/2019 1647   PO2ART 70.0 (L) 04/24/2019 1647   HCO3 35.0 (H) 04/24/2019 1647   TCO2 37 (H) 04/24/2019 1647   ACIDBASEDEF 6.0 (H) 04/22/2019 0011   O2SAT 94.0 04/24/2019 1647     Coagulation Profile: Recent Labs  Lab 04/22/19 0104  INR 1.6*    Cardiac Enzymes: No results for input(s): CKTOTAL, CKMB, CKMBINDEX,  TROPONINI in the last 168 hours.  HbA1C: Hgb A1c MFr Bld  Date/Time Value Ref Range Status  10/03/2016 06:25 AM 5.4 4.8 - 5.6 % Final    Comment:    (NOTE)         Pre-diabetes: 5.7 - 6.4         Diabetes: >6.4         Glycemic control for adults with diabetes: <7.0     CBG: Recent Labs  Lab 04/21/19 2202 04/25/19 1923 04/25/19 2313 04/26/19 0311 04/26/19 0759  GLUCAP 103* 94 116* 92 70    Review of Systems:   Unable to assess as patient unresponsive to command. I have personally reviewed the patient's data and records.  Past Medical History  She,  has a past medical history of Alcohol abuse, Bipolar disease, chronic (HCC), Hypothyroidism (acquired), and PTSD (post-traumatic stress disorder).   Surgical History   History reviewed. No pertinent surgical history.   Social History   reports that she has been smoking cigarettes. She has been smoking about 1.00 pack per day. She has never used smokeless tobacco. She reports previous alcohol use. She reports that she does not use drugs.   Family History   Her family history includes Breast cancer in her maternal grandmother; Cancer in her maternal grandmother and mother; Heart attack in her father.   Allergies Allergies  Allergen Reactions  . Morphine And Related Other (See Comments)    "Makes me crazy"  . Morphine And Related Other (See Comments)    "It is bad"  . Morphine And Related   . Trazodone And Nefazodone Other (See Comments)     Home Medications  Prior to Admission medications   Medication Sig Start Date End Date Taking? Authorizing Provider  albuterol (VENTOLIN HFA) 108 (90 Base) MCG/ACT inhaler Inhale 2 puffs into the lungs every 6 (six) hours  as needed for wheezing or shortness of breath.    [provider]  Amino Acids (AMINO ACID PO) Take 1 tablet by mouth daily.    [provider]  Aspirin-Salicylamide-Caffeine (BC HEADACHE PO) Take 6-7 packets by mouth daily as needed (headache).     [provider]  benztropine (COGENTIN) 0.5 MG tablet Take 0.5 mg by mouth at bedtime. 02/20/18   [provider]  calcium carbonate (OSCAL) 1500 (600 Ca) MG TABS tablet Take 600 mg of elemental calcium by mouth daily with breakfast.    [provider]  doxepin (SINEQUAN) 75 MG capsule Take 75 mg by mouth at bedtime as needed (sleep).  04/07/19   [provider]  gabapentin (NEURONTIN) 400 MG capsule Take 2 capsules (800 mg total) by mouth 4 (four) times daily -  with meals and at bedtime. Patient taking differently: Take 400 mg by mouth 4 (four) times daily -  with meals and at bedtime.  10/06/16   Oneta RackLewis, Tanika N, NP  ibuprofen (ADVIL) 800 MG tablet Take 800 mg by mouth 3 (three) times daily as needed for fever or headache (pain).  04/17/19   [provider]  Multiple Vitamins-Minerals (MULTIVITAMIN WITH MINERALS) tablet Take 1 tablet by mouth daily.    [provider]  Omega-3 Fatty Acids (FISH OIL OMEGA-3 PO) Take 1 tablet by mouth daily.    [provider]  omeprazole (PRILOSEC) 40 MG capsule Take 40 mg by mouth daily. 05/27/18   [provider]  promethazine (PHENERGAN) 25 MG tablet Take 25 mg by mouth daily as needed for nausea or vomiting.  05/27/18   [provider]  QUEtiapine (SEROQUEL) 100 MG tablet Take 1 tablet (100 mg total) by mouth 2 (two) times daily. Patient taking differently: Take 100 mg by mouth 2 (two) times daily with breakfast and lunch. Also take 400 mg at bedtime 10/06/16   Oneta RackLewis, Tanika N, NP  QUEtiapine (SEROQUEL) 400 MG tablet Take 400 mg by mouth at bedtime. Also take 100 mg with breakfast and lunch 04/14/19   [provider]  sertraline (ZOLOFT) 100 MG tablet Take 100 mg by mouth daily after breakfast. 04/14/19   [provider]  SYNTHROID 50 MCG tablet Take 1 tablet (50 mcg total) by mouth daily. 03/20/18   Hoy RegisterNewlin, Enobong, MD    The patient is critically ill with multiple organ  system failure and requires high complexity decision making for assessment and support, frequent evaluation and titration of therapies, advanced monitoring, review of radiographic studies and interpretation of complex data.    Critical Care Time devoted to patient care services, exclusive of separately billable procedures, described in this note is 31 minutes.  Virl DiamondAdewale Oletha Tolson, MD [place], PCCM Cell: [phone]

## 2019-04-26 NOTE — Progress Notes (Signed)
100 mL fentanyl wasted in sink w/ Reginia Forts RN

## 2019-04-26 NOTE — Progress Notes (Signed)
SLP Cancellation Note  Patient Details Name: Mary Dodson MRN: [id number] DOB: 08/22/1966   Cancelled treatment:       Reason Eval/Treat Not Completed: Patient's level of consciousness. Patient was extubated this AM and continues to not be awake/alert enough per RN. Will f/u next date.    Nadara Mode Tarrell 04/26/2019, 11:41 AM   Sonia Baller, MA, CCC-SLP Speech Therapy [facility] Pager: [phone]

## 2019-04-27 LAB — COMPREHENSIVE METABOLIC PANEL
ALT: 23 U/L (ref 0–44)
AST: 25 U/L (ref 15–41)
Albumin: 2.4 g/dL — ABNORMAL LOW (ref 3.5–5.0)
Alkaline Phosphatase: 89 U/L (ref 38–126)
Anion gap: 12 (ref 5–15)
BUN: 6 mg/dL (ref 6–20)
CO2: 26 mmol/L (ref 22–32)
Calcium: 8.7 mg/dL — ABNORMAL LOW (ref 8.9–10.3)
Chloride: 103 mmol/L (ref 98–111)
Creatinine, Ser: 0.61 mg/dL (ref 0.44–1.00)
GFR calc Af Amer: >60 mL/min (ref >60)
GFR calc non Af Amer: >60 mL/min (ref >60)
Glucose, Bld: 89 mg/dL (ref 70–99)
Potassium: 4 mmol/L (ref 3.5–5.1)
Sodium: 141 mmol/L (ref 135–145)
Total Bilirubin: 0.4 mg/dL (ref 0.3–1.2)
Total Protein: 5.4 g/dL — ABNORMAL LOW (ref 6.5–8.1)

## 2019-04-27 LAB — MAGNESIUM
Magnesium: 1.9 mg/dL (ref 1.7–2.4)
Magnesium: 1.9 mg/dL (ref 1.7–2.4)

## 2019-04-27 LAB — PHOSPHORUS
Phosphorus: 4.9 mg/dL — ABNORMAL HIGH (ref 2.5–4.6)
Phosphorus: 4.8 mg/dL — ABNORMAL HIGH (ref 2.5–4.6)

## 2019-04-27 LAB — CBC WITH DIFFERENTIAL/PLATELET
Abs Immature Granulocytes: 0.03 10*3/uL (ref 0.00–0.07)
Basophils Absolute: 0.1 10*3/uL (ref 0.0–0.1)
Basophils Relative: 1 %
Eosinophils Absolute: 0.3 10*3/uL (ref 0.0–0.5)
Eosinophils Relative: 5 %
HCT: 29.1 % — ABNORMAL LOW (ref 36.0–46.0)
Hemoglobin: 9.2 g/dL — ABNORMAL LOW (ref 12.0–15.0)
Immature Granulocytes: 1 %
Lymphocytes Relative: 20 %
Lymphs Abs: 1.3 10*3/uL (ref 0.7–4.0)
MCH: 29.3 pg (ref 26.0–34.0)
MCHC: 31.6 g/dL (ref 30.0–36.0)
MCV: 92.7 fL (ref 80.0–100.0)
Monocytes Absolute: 0.6 10*3/uL (ref 0.1–1.0)
Monocytes Relative: 9 %
Neutro Abs: 4 10*3/uL (ref 1.7–7.7)
Neutrophils Relative %: 64 %
Platelets: 261 10*3/uL (ref 150–400)
RBC: 3.14 MIL/uL — ABNORMAL LOW (ref 3.87–5.11)
RDW: 15.2 % (ref 11.5–15.5)
WBC: 6.3 10*3/uL (ref 4.0–10.5)
nRBC: 0 % (ref 0.0–0.2)

## 2019-04-27 LAB — CSF CULTURE W GRAM STAIN: Culture: NO GROWTH

## 2019-04-27 LAB — GLUCOSE, CAPILLARY
Glucose-Capillary: 81 mg/dL (ref 70–99)
Glucose-Capillary: 82 mg/dL (ref 70–99)

## 2019-04-27 LAB — TRIGLYCERIDES: Triglycerides: 100 mg/dL (ref <150)

## 2019-04-27 MED ORDER — BENZTROPINE MESYLATE 1 MG PO TABS
0.5000 mg | ORAL_TABLET | Freq: Every day | ORAL | Status: DC
  Administered 2019-04-27: 0.5 mg via ORAL

## 2019-04-27 MED ORDER — GABAPENTIN 300 MG/6ML PO SOLN
400.0000 mg | Freq: Four times a day (QID) | ORAL | Status: DC
  Administered 2019-04-27 – 2019-04-28 (×4): 400 mg via ORAL
  Filled 2019-04-27 (×5): qty 8

## 2019-04-27 MED ORDER — LORAZEPAM 2 MG/ML IJ SOLN
2.0000 mg | Freq: Once | INTRAMUSCULAR | Status: AC
  Administered 2019-04-27: 2 mg via INTRAVENOUS

## 2019-04-27 MED ORDER — LORAZEPAM 2 MG/ML IJ SOLN
INTRAMUSCULAR | Status: AC
Start: 2019-04-27 — End: 2019-04-27
  Filled 2019-04-27: qty 1

## 2019-04-27 MED ORDER — QUETIAPINE FUMARATE 200 MG PO TABS
400.0000 mg | ORAL_TABLET | Freq: Every day | ORAL | Status: DC
  Administered 2019-04-27: 400 mg via ORAL
  Filled 2019-04-27: qty 2

## 2019-04-27 MED ORDER — SERTRALINE HCL 50 MG PO TABS
100.0000 mg | ORAL_TABLET | Freq: Every day | ORAL | Status: DC
  Administered 2019-04-27 – 2019-04-28 (×2): 100 mg via ORAL
  Filled 2019-04-27 (×2): qty 2

## 2019-04-27 MED ORDER — ADULT MULTIVITAMIN W/MINERALS CH
1.0000 | ORAL_TABLET | Freq: Every day | ORAL | Status: DC
  Administered 2019-04-27 – 2019-04-28 (×2): 1 via ORAL
  Filled 2019-04-27 (×2): qty 1

## 2019-04-27 MED ORDER — LEVOTHYROXINE SODIUM 50 MCG PO TABS
50.0000 ug | ORAL_TABLET | Freq: Every day | ORAL | Status: DC
  Administered 2019-04-27 – 2019-04-28 (×2): 50 ug via ORAL
  Filled 2019-04-27 (×2): qty 1

## 2019-04-27 MED ORDER — DEXTROSE-NACL 5-0.9 % IV SOLN
INTRAVENOUS | Status: DC
  Administered 2019-04-27: 18:00:00 via INTRAVENOUS

## 2019-04-27 MED ORDER — IBUPROFEN 100 MG/5ML PO SUSP: 400.0000 mg | Freq: Four times a day (QID) | ORAL | Status: DC | PRN

## 2019-04-27 MED ORDER — HALOPERIDOL LACTATE 5 MG/ML IJ SOLN
2.0000 mg | Freq: Four times a day (QID) | INTRAMUSCULAR | Status: DC | PRN
  Administered 2019-04-27 (×2): 2 mg via INTRAVENOUS
  Filled 2019-04-27 (×3): qty 1

## 2019-04-27 MED ORDER — PANTOPRAZOLE SODIUM 40 MG PO PACK
40.0000 mg | PACK | Freq: Every day | ORAL | Status: DC
  Administered 2019-04-27: 40 mg via ORAL
  Filled 2019-04-27: qty 20

## 2019-04-27 NOTE — Progress Notes (Addendum)
precedex off since 0815, pt started to become restless and agitated around 1030.  Haldol 2mg  given & ineffective.  One time ativan 2mg  given per Dr. Ander Slade & agitation became worse over the next hr.  Pt has been thrashing around in bed and repeating incomprehensible words & phrases, is still oriented to self.  Precedex restarted per Dr. Ander Slade.  Will continue to monitor

## 2019-04-27 NOTE — Progress Notes (Signed)
Neurology Progress Note   S:// Extubated yesterday. Very agitated overnight requiring Haldol On Precedex drip.    O:// Current vital signs: BP (!) 158/67   Pulse (!) 54   Temp 99 F (37.2 C) (Oral)   Resp 17   Ht _0  (1.626 m)   Wt 87.8 kg   SpO2 98%   BMI 33.23 kg/m  Vital signs in last 24 hours: Temp:  [98.1 F (36.7 C)-99.9 F (37.7 C)] 99 F (37.2 C) (07/19 0400) Pulse Rate:  [51-99] 54 (07/19 0700) Resp:  [14-25] 17 (07/19 0700) BP: (82-176)/(43-101) 158/67 (07/19 0700) SpO2:  [87 %-100 %] 98 % (07/19 0700) Weight:  [87.8 kg] 87.8 kg (07/19 0500) General: Drowsy, in no acute distress HEENT: Normocephalic, resolving bruises under the eyes CVS: Regular rate rhythm Respiration: Breathing normally and saturating well with minimal supplemental oxygen after extubation Neurological exam Resting comfortably in bed, opens eyes to voice. Appears a little annoyed by me trying to examine her. Follows commands.  Very poor attention concentration.  Unable to reliably assess naming comprehension and repetition but her speech is non-dysarthric. Cranial nerves: Pupils equal round react light, extraocular movements intact, visual fields full to threat, face symmetric, palate and tongue midline. Motor exam: Nonfocal antigravity in all 4 extremities Sensory exam: Intact to light touch all over Did not cooperate for finger-nose-finger testing Gait testing cannot be done due to her current medical condition and was deferred.  Medications  Current Facility-Administered Medications:  .  acetaminophen (TYLENOL) solution 650 mg, 650 mg, Per Tube, Q6H PRN, Kipp Brood, MD, 650 mg at 04/25/19 2000 .  albuterol (VENTOLIN HFA) 108 (90 Base) MCG/ACT inhaler 2 puff, 2 puff, Inhalation, Q6H PRN, Karmen Bongo, MD .  benztropine (COGENTIN) tablet 0.5 mg, 0.5 mg, Per Tube, QHS, Agarwala, Ravi, MD, 0.5 mg at 04/25/19 2145 .  bisacodyl (DULCOLAX) EC tablet 5 mg, 5 mg, Oral, Daily PRN, Karmen Bongo, MD .  cefTRIAXone (ROCEPHIN) 2 g in sodium chloride 0.9 % 100 mL IVPB, 2 g, Intravenous, Q24H, Amie Portland, MD, Stopped at 04/27/19 0349 .  chlorhexidine gluconate (MEDLINE KIT) (PERIDEX) 0.12 % solution 15 mL, 15 mL, Mouth Rinse, BID, Olalere, Adewale A, MD, 15 mL at 04/26/19 1945 .  Chlorhexidine Gluconate Cloth 2 % PADS 6 each, 6 each, Topical, Daily, Kipp Brood, MD, 6 each at 04/26/19 1114 .  dexmedetomidine (PRECEDEX) 200 MCG/50ML (4 mcg/mL) infusion, 0.4-1.2 mcg/kg/hr, Intravenous, Titrated, Olalere, Adewale A, MD, Last Rate: 11.79 mL/hr at 04/27/19 0700, 0.5 mcg/kg/hr at 04/27/19 0700 .  docusate (COLACE) 50 MG/5ML liquid 100 mg, 100 mg, Per Tube, BID PRN, Ollis, Brandi L, NP .  feeding supplement (PRO-STAT SUGAR FREE 64) liquid 30 mL, 30 mL, Per Tube, Daily, Agarwala, Ravi, MD, 30 mL at 04/25/19 1648 .  folic acid (FOLVITE) tablet 1 mg, 1 mg, Oral, QHS, Olalere, Adewale A, MD .  gabapentin (NEURONTIN) 300 MG/6ML solution 400 mg, 400 mg, Per Tube, Q6H, Agarwala, Ravi, MD, 400 mg at 04/26/19 0514 .  haloperidol lactate (HALDOL) injection 2 mg, 2 mg, Intravenous, Q6H PRN, Anders Simmonds, MD, 2 mg at 04/27/19 0506 .  levothyroxine (SYNTHROID) tablet 50 mcg, 50 mcg, Per Tube, Daily, Agarwala, Ravi, MD, 50 mcg at 04/25/19 1025 .  multivitamin with minerals tablet 1 tablet, 1 tablet, Per Tube, Daily, Kipp Brood, MD, 1 tablet at 04/25/19 1649 .  ondansetron (ZOFRAN) tablet 4 mg, 4 mg, Per Tube, Q6H PRN **OR** ondansetron (ZOFRAN) injection 4 mg, 4 mg,  Intravenous, Q6H PRN, Kipp Brood, MD .  pantoprazole sodium (PROTONIX) 40 mg/20 mL oral suspension 40 mg, 40 mg, Per Tube, Q1200, Ollis, Brandi L, NP, 40 mg at 04/25/19 1235 .  polyethylene glycol (MIRALAX / GLYCOLAX) packet 17 g, 17 g, Per Tube, Daily PRN, Agarwala, Ravi, MD .  sodium chloride flush (NS) 0.9 % injection 10-40 mL, 10-40 mL, Intracatheter, Q12H, Karmen Bongo, MD, 10 mL at 04/26/19 2200 .  sodium chloride  flush (NS) 0.9 % injection 10-40 mL, 10-40 mL, Intracatheter, PRN, Karmen Bongo, MD, 10 mL at 04/23/19 0844 .  sodium chloride flush (NS) 0.9 % injection 10-40 mL, 10-40 mL, Intracatheter, Q12H, Agarwala, Ravi, MD, 10 mL at 04/26/19 2200 .  sodium chloride flush (NS) 0.9 % injection 10-40 mL, 10-40 mL, Intracatheter, PRN, Agarwala, Ravi, MD .  sodium chloride flush (NS) 0.9 % injection 3 mL, 3 mL, Intravenous, Q12H, Karmen Bongo, MD, 3 mL at 04/26/19 2200 .  thiamine (VITAMIN B-1) tablet 100 mg, 100 mg, Oral, QHS, Olalere, Adewale A, MD Labs CBC    Component Value Date/Time   WBC 6.3 04/27/2019 0458   RBC 3.14 (L) 04/27/2019 0458   HGB 9.2 (L) 04/27/2019 0458   HCT 29.1 (L) 04/27/2019 0458   PLT 261 04/27/2019 0458   MCV 92.7 04/27/2019 0458   MCH 29.3 04/27/2019 0458   MCHC 31.6 04/27/2019 0458   RDW 15.2 04/27/2019 0458   LYMPHSABS 1.3 04/27/2019 0458   MONOABS 0.6 04/27/2019 0458   EOSABS 0.3 04/27/2019 0458   BASOSABS 0.1 04/27/2019 0458    CMP     Component Value Date/Time   NA 141 04/27/2019 0458   K 4.0 04/27/2019 0458   CL 103 04/27/2019 0458   CO2 26 04/27/2019 0458   GLUCOSE 89 04/27/2019 0458   BUN 6 04/27/2019 0458   CREATININE 0.61 04/27/2019 0458   CALCIUM 8.7 (L) 04/27/2019 0458   PROT 5.4 (L) 04/27/2019 0458   ALBUMIN 2.4 (L) 04/27/2019 0458   AST 25 04/27/2019 0458   ALT 23 04/27/2019 0458   ALKPHOS 89 04/27/2019 0458   BILITOT 0.4 04/27/2019 0458   GFRNONAA >60 04/27/2019 0458   GFRAA >60 04/27/2019 0458    Imaging I have reviewed images in epic and the results pertinent to this consultation are: MRI brain with no acute changes and MRA with no LVO or intracranial stenoses.  Assessment: 53 year old woman with a week worth of progressive gait unsteadiness, worsening confusion, noted to have pneumonia on presentation with high suspicion for COVID-19 but had 3 tests for COVID-19 turn out negative remained persistently encephalopathic. Has a  significant alcohol abuse history and has history of multiple psychiatric admissions per report while she lived in [place] according to a friend who is also listed as the next of kin on the chart. Had elevated salicylate levels-history of excessive Goody powder use. LP unremarkable for any acute infection.  HSV negative. MRI unremarkable EEG with severe encephalopathy when she was sedated but no seizures  Likely presentation secondary to salicylate toxicity, pneumonia and underlying psychiatric disease.  Improving but not back to baseline yet.  Initial presentation-she was noncooperative with the exam, almost catatonic but now she is following commands but does get agitated and her mental status does not appear to be baseline still.  Impression: Toxic metabolic encephalopathy in the setting of pneumonia Less likely to be CNS infection or autoimmune encephalitis   Recommendations: Continue to work on minimizing sedation.  Transfer to floor when off of  Precedex Management of pneumonia per PCCM. Appreciate PCCM support and vent management and prompt extubation yesterday.  We will follow with you.  -- Amie Portland, MD [facility] Pager: [phone] If 7pm to 7am, please call on call as listed on [facility].

## 2019-04-27 NOTE — Progress Notes (Signed)
[facility] Progress Note Patient Name: Mary Dodson DOB: Apr 14, 1966 MRN: [id number]   Date of Service  04/27/2019  HPI/Events of Note  Delirium - QTc interval = 0.40 seconds. Increased Precedex IV infusion rate results in bradycardia.   eICU Interventions  Will order: 1. Haldol 2 mg IV Q 6 hours PRN agitation.      Intervention Category Major Interventions: Delirium, psychosis, severe agitation - evaluation and management  Dyneisha Murchison [place] 04/27/2019, 4:27 AM

## 2019-04-27 NOTE — Evaluation (Signed)
Occupational Therapy Evaluation Patient Details Name: Mary Dodson MRN: [id number] DOB: [birth year] Today's Date: 04/27/2019    History of Present Illness 32 presenting with AMS; pt's friend calling EMS due to worsening confusion since July 9th. Patient also found to have Acute on Chronic respiratory failure with hypoxia requiring intubation. Extubated on 7/18/202. COVID tests negative. PMH including PTSD, ETOH dependence, hypothyroidism, and bipolar.   Clinical Impression   Upon arrival, pt supine in bed, with restraints in place, and calling out to RN. Pt poor historian and difficulty collecting home set up and PLOF; pt reports she lives alone in a house. Pt currently requiring Mod A for UB ADLs, Min-Mod A for LB ADLs, and Mod A +2 for functional mobility. Pt keeping her eye closed for majority of session and demonstrated impulsivity and restlessness. Pt requiring Max cues to participate in ADLs due to decreased attention and problem solving. Pt would benefit from further acute OT to facilitate safe dc. Recommend dc to SNF for further OT to optimize safety, independence with ADLs, and return to PLOF.      Follow Up Recommendations  SNF;Supervision/Assistance - 24 hour    Equipment Recommendations  None recommended by OT    Recommendations for Other Services PT consult;Speech consult     Precautions / Restrictions Precautions Precautions: Fall Restrictions Weight Bearing Restrictions: No      Mobility Bed Mobility Overal bed mobility: Needs Assistance Bed Mobility: Supine to Sit     Supine to sit: Min guard;HOB elevated     General bed mobility comments: Close Min Guard A for safety with HOB elevated for trunk support  Transfers Overall transfer level: Needs assistance Equipment used: 1 person hand held assist Transfers: Sit to/from Stand Sit to Stand: Min assist;From elevated surface;+2 safety/equipment         General transfer comment: Min A +2 to power up and  then gain balance in standing    Balance Overall balance assessment: Needs assistance Sitting-balance support: No upper extremity supported;Feet supported Sitting balance-Leahy Scale: Fair     Standing balance support: Single extremity supported;During functional activity Standing balance-Leahy Scale: Poor Standing balance comment: Reliant on UE support                           ADL either performed or assessed with clinical judgement   ADL Overall ADL's : Needs assistance/impaired Eating/Feeding: Minimal assistance;Sitting   Grooming: Minimal assistance;Sitting   Upper Body Bathing: Moderate assistance;Sitting   Lower Body Bathing: Moderate assistance;Sit to/from stand   Upper Body Dressing : Moderate assistance;Sitting   Lower Body Dressing: Minimal assistance;Moderate assistance;Bed level;Sit to/from stand Lower Body Dressing Details (indicate cue type and reason): Pt requiring Min A for donning socks at bed level in long sitting; required MAX cues due to poor problem solving and attention. Mod A for balance in standing with mobility and dynamic movements Toilet Transfer: Moderate assistance;+2 for safety/equipment;Ambulation;+2 for physical assistance(simulated to recliner)           Functional mobility during ADLs: Moderate assistance;+2 for safety/equipment;+2 for physical assistance General ADL Comments: Pt presenting with decreased cognition, strength, balance, coorindation, and activity tolerance     Vision   Additional Comments: Pt keeping her eyes closed     Perception     Praxis      Pertinent Vitals/Pain Pain Assessment: Faces Faces Pain Scale: Hurts little more Pain Location: generlized with movement Pain Descriptors / Indicators: Discomfort;Grimacing Pain Intervention(s): Monitored during session;Repositioned  Hand Dominance Right   Extremity/Trunk Assessment Upper Extremity Assessment Upper Extremity Assessment: Generalized  weakness;Difficult to assess due to impaired cognition   Lower Extremity Assessment Lower Extremity Assessment: Defer to PT evaluation       Communication Communication Communication: Expressive difficulties(stuttering and perseverating. Difficulty expressing thoughts)   Cognition Arousal/Alertness: Lethargic Behavior During Therapy: Restless;Impulsive;Flat affect(Keeping her eyes closed majority of session) Overall Cognitive Status: Difficult to assess Area of Impairment: Orientation;Attention;Following commands;Memory;Safety/judgement;Awareness;Problem solving                 Orientation Level: Person;Situation(Stating it is 2018) Current Attention Level: Focused Memory: Decreased short-term memory Following Commands: Follows one step commands inconsistently;Follows one step commands with increased time Safety/Judgement: Decreased awareness of deficits;Decreased awareness of safety Awareness: Intellectual Problem Solving: Slow processing;Difficulty sequencing;Requires verbal cues General Comments: Pt requiring cues and increased time throughout. Max cues to sustain attention to donning socks. Pt moaning and with difficulty expressing thoughts; repeating herself or certain words.   General Comments  SpO2 98% on 2L O2. VSS throughout.    Exercises     Shoulder Instructions      Home Living Family/patient expects to be discharged to:: Private residence Living Arrangements: Alone   Type of Home: House                           Additional Comments: Pt poor historian. Answering that she lives alone in a house, but not answering further questions about PLOF and home      Prior Functioning/Environment          Comments: Unsure due to poor historian.        OT Problem List: Decreased strength;Decreased range of motion;Decreased activity tolerance;Impaired balance (sitting and/or standing);Decreased safety awareness;Decreased knowledge of use of DME or  AE;Decreased knowledge of precautions;Decreased cognition;Decreased coordination;Impaired UE functional use;Pain      OT Treatment/Interventions: Self-care/ADL training;Therapeutic exercise;Energy conservation;DME and/or AE instruction;Cognitive remediation/compensation;Therapeutic activities;Patient/family education    OT Goals(Current goals can be found in the care plan section) Acute Rehab OT Goals Patient Stated Goal: Unstated OT Goal Formulation: Patient unable to participate in goal setting Time For Goal Achievement: 05/11/19 Potential to Achieve Goals: Good  OT Frequency: Min 2X/week   Barriers to D/C:            Co-evaluation PT/OT/SLP Co-Evaluation/Treatment: Yes Reason for Co-Treatment: For patient/therapist safety;To address functional/ADL transfers;Complexity of the patient's impairments (multi-system involvement);Necessary to address cognition/behavior during functional activity   OT goals addressed during session: ADL's and self-care      AM-PAC OT "6 Clicks" Daily Activity     Outcome Measure Help from another person eating meals?: A Little Help from another person taking care of personal grooming?: A Little Help from another person toileting, which includes using toliet, bedpan, or urinal?: A Lot Help from another person bathing (including washing, rinsing, drying)?: A Lot Help from another person to put on and taking off regular upper body clothing?: A Lot Help from another person to put on and taking off regular lower body clothing?: A Lot 6 Click Score: 14   End of Session Equipment Utilized During Treatment: Oxygen;Gait belt Nurse Communication: Mobility status  Activity Tolerance: Patient limited by fatigue;Patient limited by lethargy Patient left: in chair;with call bell/phone within reach;with chair alarm set;with nursing/sitter in room  OT Visit Diagnosis: Unsteadiness on feet (R26.81);Other abnormalities of gait and mobility (R26.89);Muscle weakness  (generalized) (M62.81);Other symptoms and signs involving cognitive function;Pain Pain - part  of body: (Generalized)                Time: [id number] OT Time Calculation (min): 18 min Charges:  OT General Charges $OT Visit: 1 Visit OT Evaluation $OT Eval Moderate Complexity: 1 Mod  Sondi Desch MSOT, OTR/L Acute Rehab Pager: [phone] Office: [phone]  Theodoro GristCharis M Sunday Klos 04/27/2019, 11:17 AM

## 2019-04-27 NOTE — Evaluation (Signed)
Clinical/Bedside Swallow Evaluation Patient Details  Name: Mary Dodson MRN: [id number] Date of Birth: January 23, 1966  Today's Date: 04/27/2019 Time: SLP Start Time (ACUTE ONLY): 0848 SLP Stop Time (ACUTE ONLY): 0912 SLP Time Calculation (min) (ACUTE ONLY): 24 min  Past Medical History:  Past Medical History:  Diagnosis Date  . Alcohol abuse   . Bipolar disease, chronic (HCC)   . Hypothyroidism (acquired)   . PTSD (post-traumatic stress disorder)    Past Surgical History: History reviewed. No pertinent surgical history. HPI:  53 y.o. female with medical history significant of PTSD; bipolar d/o; ETOH dependence; and hypothyroidism presenting with AMS.  She was seen by neurology in 6/20 for episodic headache with confusion/delirium, possibly due to complicated migraines vs. Atypical seizure.  She had a normal awake and asleep EEG on 6/17.  EMS was called yesterday evening by her friend due to concerns of recurrent falls, AMS.  She was seen last night by PA Joy, and the patient was alert enough to answer questions (in a belligerent and somewhat nonsensical manner).   There was concern for sepsis and she was given a 1L bolus of LR and Rocephin/Azithromycin after she had fever.  She was found to have an elevated salicylate level and Poison Control was contacted.  They recommended calling nephrology (dialysis not indicated) and following the level.  She was started on a bicarb drip mixed with KCl and D5W.  CXR showed B PNA and O2 sats dropped to 90% so she was started on 2L O2.  She required Ativan and Geodon IM overnight due to periodic severe agitation; intubated 7/16-7/18/20; BSE attempted on 04/25/09 with pt unable to participate d/t lethargy; recent CXR on 04/24/19 indicated Patchy bilateral airspace disease, left greater than right, similar to prior study.   Assessment / Plan / Recommendation Clinical Impression   Pt presents with mild oropharyngeal dysphagia characterized by impaired mastication  with solids, decreased oral prep/propulsion into pharynx primarily d/t decreased mentation/intermittent lethargy with one incidence of delayed cough after solids; mild xerostomia present; no overt s/s of aspiration noted, but pt did exhibit a mild hoarse vocal quality during BSE; recommend initiating a Dysphagia 1 (puree)/thin liquid diet with aspiration/swallowing precautions in place d/t behavior being unpredictable at this time; medications whole/puree; ST will f/u in acute setting for diet tolerance/upgrades as able; thank you for this consult. SLP Visit Diagnosis: Dysphagia, unspecified (R13.10)    Aspiration Risk  Mild aspiration risk;Moderate aspiration risk    Diet Recommendation   Dysphagia 1/thin (small sips)  Medication Administration: Whole meds with puree    Other  Recommendations Oral Care Recommendations: Oral care BID   Follow up Recommendations Other (comment)(TBD)      Frequency and Duration min 2x/week  1 week       Prognosis Prognosis for Safe Diet Advancement: Good Barriers to Reach Goals: Behavior      Swallow Study   General Date of Onset: 04/22/19 HPI: 53 y.o. female with medical history significant of PTSD; bipolar d/o; ETOH dependence; and hypothyroidism presenting with AMS.  She was seen by neurology in 6/20 for episodic headache with confusion/delirium, possibly due to complicated migraines vs. Atypical seizure.  She had a normal awake and asleep EEG on 6/17.  EMS was called yesterday evening by her friend due to concerns of recurrent falls, AMS.  She was seen last night by PA Joy, and the patient was alert enough to answer questions (in a belligerent and somewhat nonsensical manner).   There was concern for sepsis  and she was given a 1L bolus of LR and Rocephin/Azithromycin after she had fever.  She was found to have an elevated salicylate level and Poison Control was contacted.  They recommended calling nephrology (dialysis not indicated) and following the  level.  She was started on a bicarb drip mixed with KCl and D5W.  CXR showed B PNA and O2 sats dropped to 90% so she was started on 2L O2.  She required Ativan and Geodon IM overnight due to periodic severe agitation.   Type of Study: Bedside Swallow Evaluation Diet Prior to this Study: NPO Temperature Spikes Noted: Yes(low grade (99)) Respiratory Status: Nasal cannula(4L) History of Recent Intubation: Yes Length of Intubations (days): (2) Date extubated: 04/26/19 Behavior/Cognition: Agitated;Confused;Lethargic/Drowsy;Requires cueing;Distractible Oral Cavity Assessment: Dry Oral Care Completed by SLP: Yes Oral Cavity - Dentition: Adequate natural dentition Self-Feeding Abilities: Needs assist;Needs set up;Other (Comment)(currently in restraints; confused; pulling at tubes/leads) Patient Positioning: Upright in bed Baseline Vocal Quality: Hoarse;Other (comment)(minimal) Volitional Cough: Cognitively unable to elicit;Other (Comment)(not attempted d/t Covid precautions) Volitional Swallow: Able to elicit    Oral/Motor/Sensory Function Overall Oral Motor/Sensory Function: Other (comment)(DTA as pt is not following directives consistently; WFL)   Ice Chips Ice chips: Within functional limits Presentation: Spoon   Thin Liquid Thin Liquid: Within functional limits Presentation: Straw;Cup    Nectar Thick Nectar Thick Liquid: Not tested   Honey Thick Honey Thick Liquid: Not tested   Puree Puree: Within functional limits Presentation: Spoon   Solid     Solid: Impaired Presentation: Spoon Oral Phase Impairments: Impaired mastication;Reduced lingual movement/coordination Oral Phase Functional Implications: Prolonged oral transit;Impaired mastication Pharyngeal Phase Impairments: Cough - Delayed      Elvina Sidle, M.S., CCC-SLP 04/27/2019,10:08 AM

## 2019-04-27 NOTE — Evaluation (Signed)
Physical Therapy Evaluation Patient Details Name: Ophelia CharterCheryl Benyo MRN: [id number] DOB: 1966-06-27 Today's Date: 04/27/2019   History of Present Illness  [age] presenting with AMS; pt's friend calling EMS due to worsening confusion since July 9th. Patient also found to have Acute on Chronic respiratory failure with hypoxia requiring intubation. Extubated on 7/18/202. COVID tests negative. PMH including PTSD, ETOH dependence, hypothyroidism, and bipolar.  Clinical Impression  Pt admitted with above diagnosis. Pt currently with functional limitations due to the deficits listed below (see PT Problem List). Pt is a poor historian; unable to obtain PLOF or home set up. On PT evaluation, pt presents with decreased functional mobility secondary to decreased cognition, weakness and balance impairments. Pt requiring two person moderate assist to ambulate 3 feet from bed to chair. Displays decreased attention, awareness, orientation; very restless and impulsive. Kept eyes closed through majority of session, following 1 step simple commands with repetition. Pt will benefit from skilled PT to increase their independence and safety with mobility to allow discharge to the venue listed below.       Follow Up Recommendations SNF;Supervision/Assistance - 24 hour    Equipment Recommendations  Other (comment)(defer)    Recommendations for Other Services       Precautions / Restrictions Precautions Precautions: Fall Restrictions Weight Bearing Restrictions: No      Mobility  Bed Mobility Overal bed mobility: Needs Assistance Bed Mobility: Supine to Sit     Supine to sit: Min guard;HOB elevated     General bed mobility comments: Close Min Guard A for safety with HOB elevated for trunk support  Transfers Overall transfer level: Needs assistance Equipment used: 1 person hand held assist Transfers: Sit to/from Stand Sit to Stand: Min assist;From elevated surface;+2 safety/equipment         General  transfer comment: Min A +2 to power up and then gain balance in standing  Ambulation/Gait Ambulation/Gait assistance: Mod assist;+2 physical assistance Gait Distance (Feet): 3 Feet Assistive device: None Gait Pattern/deviations: Step-through pattern;Narrow base of support;Scissoring Gait velocity: decreased Gait velocity interpretation: <1.8 ft/sec, indicate of risk for recurrent falls General Gait Details: Requiring modA + 2 for stability, weaving and scissoring, keeping eyes closed  Stairs            Wheelchair Mobility    Modified Rankin (Stroke Patients Only)       Balance Overall balance assessment: Needs assistance Sitting-balance support: No upper extremity supported;Feet supported Sitting balance-Leahy Scale: Fair     Standing balance support: Single extremity supported;During functional activity Standing balance-Leahy Scale: Poor Standing balance comment: Reliant on UE support                             Pertinent Vitals/Pain Pain Assessment: Faces Faces Pain Scale: Hurts little more Pain Location: generlized with movement Pain Descriptors / Indicators: Discomfort;Grimacing Pain Intervention(s): Monitored during session    Home Living Family/patient expects to be discharged to:: Private residence Living Arrangements: Alone   Type of Home: House           Additional Comments: Pt poor historian. Answering that she lives alone in a house, but not answering further questions about PLOF and home    Prior Function           Comments: Unsure due to poor historian.     Hand Dominance   Dominant Hand: Right    Extremity/Trunk Assessment   Upper Extremity Assessment Upper Extremity Assessment: Generalized weakness  Lower Extremity Assessment Lower Extremity Assessment: Generalized weakness       Communication   Communication: Expressive difficulties(stuttering and perseverating. Difficulty expressing thoughts)  Cognition  Arousal/Alertness: Lethargic Behavior During Therapy: Restless;Impulsive;Flat affect(Keeping her eyes closed majority of session) Overall Cognitive Status: Difficult to assess Area of Impairment: Orientation;Attention;Following commands;Memory;Safety/judgement;Awareness;Problem solving                 Orientation Level: Situation;Disoriented to;Time(Stating it is 2018) Current Attention Level: Focused Memory: Decreased short-term memory Following Commands: Follows one step commands inconsistently;Follows one step commands with increased time Safety/Judgement: Decreased awareness of deficits;Decreased awareness of safety Awareness: Intellectual Problem Solving: Slow processing;Difficulty sequencing;Requires verbal cues General Comments: Pt requiring cues and increased time throughout. Max cues to sustain attention to donning socks. Pt moaning and with difficulty expressing thoughts; repeating herself or certain words.      General Comments General comments (skin integrity, edema, etc.): SpO2 98% on 2L O2. VSS throughout.    Exercises     Assessment/Plan    PT Assessment Patient needs continued PT services  PT Problem List Decreased strength;Decreased activity tolerance;Decreased balance;Decreased mobility;Decreased coordination;Decreased cognition;Decreased safety awareness       PT Treatment Interventions DME instruction;Gait training;Stair training;Functional mobility training;Therapeutic exercise;Therapeutic activities;Balance training;Patient/family education;Cognitive remediation    PT Goals (Current goals can be found in the Care Plan section)  Acute Rehab PT Goals Patient Stated Goal: Unstated PT Goal Formulation: Patient unable to participate in goal setting Time For Goal Achievement: 05/11/19 Potential to Achieve Goals: Fair    Frequency Min 3X/week   Barriers to discharge        Co-evaluation PT/OT/SLP Co-Evaluation/Treatment: Yes Reason for Co-Treatment:  Necessary to address cognition/behavior during functional activity;To address functional/ADL transfers;For patient/therapist safety PT goals addressed during session: Mobility/safety with mobility OT goals addressed during session: ADL's and self-care       AM-PAC PT "6 Clicks" Mobility  Outcome Measure Help needed turning from your back to your side while in a flat bed without using bedrails?: None Help needed moving from lying on your back to sitting on the side of a flat bed without using bedrails?: A Little Help needed moving to and from a bed to a chair (including a wheelchair)?: A Lot Help needed standing up from a chair using your arms (e.g., wheelchair or bedside chair)?: A Little Help needed to walk in hospital room?: A Lot Help needed climbing 3-5 steps with a railing? : Total 6 Click Score: 15    End of Session Equipment Utilized During Treatment: Gait belt;Oxygen Activity Tolerance: Patient limited by fatigue Patient left: in chair;with call bell/phone within reach;with chair alarm set Nurse Communication: Mobility status PT Visit Diagnosis: Unsteadiness on feet (R26.81);Difficulty in walking, not elsewhere classified (R26.2)    Time: 9528-4132 PT Time Calculation (min) (ACUTE ONLY): 21 min   Charges:   PT Evaluation $PT Eval Moderate Complexity: 1 Mod          Ellamae Sia, [place], DPT Acute Rehabilitation Services Pager [phone] Office [phone]   Willy Eddy 04/27/2019, 12:59 PM

## 2019-04-27 NOTE — Progress Notes (Signed)
NAME:  Ophelia CharterCheryl Stites, MRN:  [id number], DOB:  1966/09/02, LOS: 5 ADMISSION DATE:  04/21/2019, CONSULTATION DATE:  04/24/2019 REFERRING MD: Hartley BarefootBelkys Regalado, CHIEF COMPLAINT:  AMS  Brief History    Ophelia CharterCheryl Burger is a 53 y.o. female with medical history significant of PTSD, bipolar d/o, ETOH dependence, and hypothyroidism who presented with AMS, fever, and agitation consistent wit acute encephalopathy. Patient also found to have Acute on Chronic respiratory failure with hypoxia requiring intubation. PCCM consulted for vent management.  History of present illness    Ophelia CharterCheryl Hainsworth is a 53 y.o. female with medical history significant of PTSD; bipolar d/o; ETOH dependence; and hypothyroidism presenting with AMS.  She was seen by neurology in 6/20 for episodic headache with confusion/delirium. She had a normal awake and asleep EEG on 6/17.  EMS was called on 7/12 by her friend due to concerns of recurrent falls, AMS.  She was seen  by PA Joy, and the patient was alert enough to answer questions (in a belligerent and somewhat nonsensical manner).  In the ED, there was concern for sepsis and she was given a 1L bolus of LR and Rocephin/Azithromycin after she had fever. She was found to have an elevated salicylate level and Poison Control was contacted. They recommended calling nephrology (dialysis not indicated) and following the level.  She was started on a bicarb drip mixed with KCl and D5W.  CXR showed B PNA and O2 sats dropped to 90% so she was started on 2L O2.  She required Ativan and Geodon IM in ED due to periodic severe agitation.  She pulled out all of her tubes (foley, multiple IVs).   Of note, she has complained of severe headaches for the last few weeks.  She has complained of feeling like she was in a fog.  She "comes in contact with those people who got that COVID infection."  She said she was taking lots of BC powders.  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  She goes to AA - hasn't had "nothing in about 3 years."    Past Medical History  - Alcohol use disorder, severe dependence - Bipolar disorder, current episode manic severe with psychotic features  - Hypothyroidism (acquired) - PTSD  Significant Hospital Events    04/22/2019 admitted 04/24/2019 ETT 7/17 lumbar puncture, MRI 7/18 extubated  Consults:  Nephrology Neurology PCCM  Procedures:  ETT 04/24/2019 >> Lumbar puncture 04/25/2019  Significant Diagnostic Tests:  MRSA PCR > negative SARS-CoV-2 > negative x3  CT Head Wo Contrast 04/22/2019 IMPRESSION: No acute intracranial or cervical spine finding.  CT Cervical Spine Wo Contrast 04/22/2019 IMPRESSION: No acute intracranial or cervical spine finding.  CXR 04/21/2019 Impression: Patchy and indistinct bilateral pulmonary opacity suspicious for bilateral pneumonia. Consider viral/atypical etiology. No pleural Effusion.  MRI results 04/24/2019-reviewed  Micro Data:  UCx 7/14 > recollect (dirty sample) BCx 7/13 > NGTD UCx 7/16> Resp Cx 7/16> Antimicrobials:  Azithromycin - 7/13-7/16 Ceftriaxone - 7/13 >> Vancomycin - 7/15-7/16  Antivirals:  Acyclovir - 7/15- 7/18  Interim history/subjective:  Off sedation Does not appear to be in respiratory distress  Objective   Blood pressure (!) 141/99, pulse 77, temperature (!) 97.5 F (36.4 C), temperature source Axillary, resp. rate (!) 21, height 5\' 4"  (1.626 m), weight 87.8 kg, SpO2 96 %, unknown if currently breastfeeding.  Intake/Output Summary (Last 24 hours) at 04/27/2019 0920 Last data filed at 04/27/2019 0900 Gross per 24 hour  Intake 267.49 ml  Output 2575 ml  Net -2307.51 ml   Filed Weights   04/24/19 0500 04/26/19 0500 04/27/19 0500  Weight: 89.1 kg 94.3 kg 87.8 kg   Examination: Elderly lady, appears comfortable on sedation  Rhonchi bilaterally S1-S2 appreciated Bowel sounds appreciated No peripheral edema Bruising under both eyelids  Resolved Hospital Problem list   X  Assessment & Plan:   Acute encephalopathy -Status post LP-no signs of meningitis -Continue sedation in the interest of patient safety -Not back to baseline  Acute on chronic respiratory failure with hypoxia -Multifocal infiltrates have been stable -Hypoxemia likely chronic -Continue oxygen supplementation, monitor closely  Recent fever -Fever trend is down -Leukocytosis resolved -On ceftriaxone -Discontinue ceftriaxone after today's dose-total 7 days  Anemia -Trend hematocrit  Metabolic encephalopathy -Concern for salicylate poisoning -Levels improved  Continue CIWA protocol for EtOH dependence  History of hypothyroidism -Continue to hold Synthroid  Bipolar disorder -Holding home medications -We will reintroduce her medications as able-when able to take p.o. -Reintroduce home meds Was taking 400 mg Seroquel at night and 100 mg every morning and p.m. Zoloft 100 mg daily  Weaned down on Precedex as tolerated  Best practice:  Diet: Hold tube feeds Pain/Anxiety/Delirium protocol (if indicated): Ativan sch, Fentanyl PRN  VAP protocol (if indicated): In place DVT prophylaxis: Lovenox GI prophylaxis: PPI Glucose control: None Code Status: FULL Family Communication: NA  Disposition: ICU  Labs   CBC: Recent Labs  Lab 04/23/19 0357 04/24/19 1155 04/24/19 1233 04/24/19 1647 04/25/19 0532 04/26/19 0511 04/27/19 0458  WBC 9.3 9.3  --   --  5.7 5.5 6.3  NEUTROABS 6.8 6.9  --   --  3.1 3.0 4.0  HGB 9.3* 9.0* 12.2 8.8* 8.1* 8.9* 9.2*  HCT 28.5* 28.7* 36.0 26.0* 25.6* 28.7* 29.1*  MCV 89.9 92.3  --   --  91.8 95.0 92.7  PLT 219 236  --   --  190 230 261    Basic Metabolic Panel: Recent Labs  Lab 04/23/19 0357  04/24/19 1155 04/24/19 1233 04/24/19 1647 04/25/19 0532 04/26/19 0511 04/26/19 1645 04/27/19  0458  NA 142  --  145 145 144 143 145  --  141  K 3.5  --  3.7 3.3* 3.5 3.0* 4.4  --  4.0  CL 105  --  105  --   --  102 109  --  103  CO2 27  --  32  --   --  33* 29  --  26  GLUCOSE 193*  --  136*  --   --  123* 103*  --  89  BUN <5*  --  <5*  --   --  <5* 5*  --  6  CREATININE 0.58  --  0.47  --   --  0.56 0.65  --  0.61  CALCIUM 8.1*  --  8.0*  --   --  7.6* 8.3*  --  8.7*  MG 1.7  --  1.7  --   --  1.7 2.0 1.9 1.9  PHOS 1.4*   < > 2.5  --   --  2.9 3.8 5.2* 4.9*   < > = values in this interval not displayed.   GFR: Estimated Creatinine Clearance: 88.2 mL/min (by C-G formula based on SCr of 0.61 mg/dL). Recent Labs  Lab 04/21/19 2145 04/22/19 0104  04/24/19 1155  04/25/19 0532 04/26/19 0511 04/27/19 0458  PROCALCITON  --  <0.10  --   --   --   --   --   WBC  --   --    < > 9.3 5.7 5.5 6.3  LATICACIDVEN 0.8 0.7  --   --   --   --   --    < > = values in this interval not displayed.    Liver Function Tests: Recent Labs  Lab 04/23/19 0357 04/24/19 1155 04/25/19 0532 04/26/19 0511 04/27/19 0458  AST 49* 34 24 26 25   ALT 22 22 21 21 23   ALKPHOS 71 68 85 78 89  BILITOT 0.4 0.2* 0.4 <0.1* 0.4  PROT 5.6* 5.2* 4.4* 4.9* 5.4*  ALBUMIN 2.8* 2.6* 2.1* 2.1* 2.4*   No results for input(s): LIPASE, AMYLASE in the last 168 hours. Recent Labs  Lab 04/21/19 2121 04/22/19 1908  AMMONIA 26 25    ABG    Component Value Date/Time   PHART 7.447 04/24/2019 1647   PCO2ART 50.9 (H) 04/24/2019 1647   PO2ART 70.0 (L) 04/24/2019 1647   HCO3 35.0 (H) 04/24/2019 1647   TCO2 37 (H) 04/24/2019 1647   ACIDBASEDEF 6.0 (H) 04/22/2019 0011   O2SAT 94.0 04/24/2019 1647     Coagulation Profile: Recent Labs  Lab 04/22/19 0104  INR 1.6*    Cardiac Enzymes: No results for input(s): CKTOTAL, CKMB, CKMBINDEX, TROPONINI in the last 168 hours.  HbA1C: Hgb A1c MFr Bld  Date/Time Value Ref Range Status  10/03/2016 06:25 AM 5.4 4.8 - 5.6 % Final    Comment:    (NOTE)          Pre-diabetes: 5.7 - 6.4         Diabetes: >6.4         Glycemic control for adults with diabetes: <7.0     CBG: Recent Labs  Lab 04/26/19 1535 04/26/19 1915 04/26/19 2312 04/27/19 0323 04/27/19 0730  GLUCAP 88 95 78 81 82    Review of Systems:   Unable to assess as patient unresponsive to command. I have personally reviewed the patient's data and records.  Past Medical History  She,  has a past medical history of Alcohol abuse, Bipolar disease, chronic (HCC), Hypothyroidism (acquired), and PTSD (post-traumatic stress disorder).   Surgical History   History reviewed. No pertinent surgical history.   Social History   reports that she has been smoking cigarettes. She has been smoking about 1.00 pack per day. She has never used smokeless tobacco. She reports previous alcohol use. She reports that she does not use drugs.   Family History   Her family history includes Breast cancer in her maternal grandmother; Cancer in her maternal grandmother and mother; Heart attack in her father.   Allergies Allergies  Allergen Reactions  . Morphine And Related Other (See Comments)    "Makes me crazy"  . Morphine And Related Other (See Comments)    "It is bad"  . Morphine And Related   . Trazodone And Nefazodone Other (See Comments)     Home Medications  Prior to Admission medications   Medication Sig Start Date End Date Taking? Authorizing Provider  albuterol (VENTOLIN HFA) 108 (90 Base) MCG/ACT inhaler Inhale 2 puffs into the lungs every 6 (six) hours as needed for wheezing or shortness of breath.    [provider]  Amino Acids (AMINO ACID PO) Take 1 tablet by mouth daily.    [provider]  Aspirin-Salicylamide-Caffeine (  BC HEADACHE PO) Take 6-7 packets by mouth daily as needed (headache).    [provider]  benztropine (COGENTIN) 0.5 MG tablet Take 0.5 mg by mouth at bedtime. 02/20/18   [provider]  calcium carbonate (OSCAL) 1500 (600 Ca)  MG TABS tablet Take 600 mg of elemental calcium by mouth daily with breakfast.    [provider]  doxepin (SINEQUAN) 75 MG capsule Take 75 mg by mouth at bedtime as needed (sleep).  04/07/19   [provider]  gabapentin (NEURONTIN) 400 MG capsule Take 2 capsules (800 mg total) by mouth 4 (four) times daily -  with meals and at bedtime. Patient taking differently: Take 400 mg by mouth 4 (four) times daily -  with meals and at bedtime.  10/06/16   Derrill Center, NP  ibuprofen (ADVIL) 800 MG tablet Take 800 mg by mouth 3 (three) times daily as needed for fever or headache (pain).  04/17/19   [provider]  Multiple Vitamins-Minerals (MULTIVITAMIN WITH MINERALS) tablet Take 1 tablet by mouth daily.    [provider]  Omega-3 Fatty Acids (FISH OIL OMEGA-3 PO) Take 1 tablet by mouth daily.    [provider]  omeprazole (PRILOSEC) 40 MG capsule Take 40 mg by mouth daily. 05/27/18   [provider]  promethazine (PHENERGAN) 25 MG tablet Take 25 mg by mouth daily as needed for nausea or vomiting.  05/27/18   [provider]  QUEtiapine (SEROQUEL) 100 MG tablet Take 1 tablet (100 mg total) by mouth 2 (two) times daily. Patient taking differently: Take 100 mg by mouth 2 (two) times daily with breakfast and lunch. Also take 400 mg at bedtime 10/06/16   Derrill Center, NP  QUEtiapine (SEROQUEL) 400 MG tablet Take 400 mg by mouth at bedtime. Also take 100 mg with breakfast and lunch 04/14/19   [provider]  sertraline (ZOLOFT) 100 MG tablet Take 100 mg by mouth daily after breakfast. 04/14/19   [provider]  SYNTHROID 50 MCG tablet Take 1 tablet (50 mcg total) by mouth daily. 03/20/18   Charlott Rakes, MD    The patient is critically ill with multiple organ system failure and requires high complexity decision making for assessment and support, frequent evaluation and titration of therapies, advanced monitoring, review of  radiographic studies and interpretation of complex data.    Critical Care Time devoted to patient care services, exclusive of separately billable procedures, described in this note is 30 minutes.  Sherrilyn Rist, MD [place], PCCM Cell: [phone]

## 2019-04-27 NOTE — Plan of Care (Signed)
  Problem: Clinical Measurements: Goal: Respiratory complications will improve Outcome: Progressing   

## 2019-04-28 DIAGNOSIS — R4182 Altered mental status, unspecified: Secondary | ICD-10-CM

## 2019-04-28 LAB — VITAMIN B6: Vitamin B6: 7.1 ug/L (ref 2.0–32.8)

## 2019-04-28 MED ORDER — GABAPENTIN 400 MG PO CAPS: 400.0000 mg | ORAL_CAPSULE | Freq: Four times a day (QID) | ORAL | Status: DC

## 2019-04-28 MED ORDER — DEXMEDETOMIDINE HCL IN NACL 200 MCG/50ML IV SOLN
0.4000 ug/kg/h | INTRAVENOUS | Status: DC
  Administered 2019-04-28: 0.4 ug/kg/h via INTRAVENOUS
  Filled 2019-04-28: qty 50

## 2019-04-28 MED ORDER — LORAZEPAM 1 MG PO TABS
1.0000 mg | ORAL_TABLET | Freq: Two times a day (BID) | ORAL | Status: DC
  Administered 2019-04-28: 1 mg via ORAL
  Filled 2019-04-28: qty 1

## 2019-04-28 NOTE — Progress Notes (Signed)
Patient has been insist ant on leaving all day today.  After Psych MD consult and multiple discussions with patient, her [place] mentor, and Dr Ander Slade, the patient has asked to leave AMA.   AMA paperwork has been signed, IVs removed, and the patient has removed all monitors.   Patient has put on her own clothes and is waiting on call from friend to arrive at front door. Mary Dodson C 6:01 PM
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
## 2019-04-28 NOTE — Progress Notes (Signed)
Nutrition Follow-up  DOCUMENTATION CODES:   Not applicable  INTERVENTION:   Magic cup TID with meals, each supplement provides 290 kcal and 9 grams of protein  Continue MVI daily with folic acid and thiamine supplementation  Vitamin C and B6 labs pending   NUTRITION DIAGNOSIS:   Inadequate oral intake related to dysphagia as evidenced by (altered texture diet; diet just advanced). Ongoing.   GOAL:   Patient will meet greater than or equal to 90% of their needs Progressing.   MONITOR:   PO intake, Supplement acceptance, Labs  REASON FOR ASSESSMENT:   Consult, Ventilator Enteral/tube feeding initiation and management  ASSESSMENT:   Pt with PMH of PTSD, bipolar d/o, severe ETOH dependence and hypothyroidism who is admitted with AMS, multifocal PNA noted, pt COVID - x 3. Neurology following for multifactorial toxic metabolic encephalopathy.  Pt discussed during ICU rounds and with RN.  7/18 extubated Per CCM remains agitated.  SLP continues to work with patient.  Psych consult pending.  Medications reviewed and include: folic acid, MVI, 40 mEq KCl BID, 100 mg thiamine daily  Labs reviewed: Vitamin B1: 209 (H) Admission weight: 86.6 kg  Pt is positive 3 L; mild edema    NUTRITION - FOCUSED PHYSICAL EXAM:  Deferred   Diet Order:   Diet Order            DIET DYS 2 Room service appropriate? Yes; Fluid consistency: Thin  Diet effective now              EDUCATION NEEDS:   No education needs have been identified at this time  Skin:  Skin Assessment: Reviewed RN Assessment  Last BM:  7/19  Height:   Ht Readings from Last 1 Encounters:  04/22/19 5\' 4"  (1.626 m)    Weight:   Wt Readings from Last 1 Encounters:  04/28/19 91.5 kg    Ideal Body Weight:  54.5 kg  BMI:  Body mass index is 34.63 kg/m.  Estimated Nutritional Needs:   Kcal:  1700-1900  Protein:  90-110 grams  Fluid:  > 1.8 L/day  Maylon Peppers RD, LDN, CNSC [phone]  Pager [phone] After Hours Pager

## 2019-04-28 NOTE — Progress Notes (Signed)
  Speech Language Pathology Treatment:    Patient Details Name: Mary Dodson MRN: [id number] DOB: 30-Oct-1965 Today's Date: 04/28/2019 Time: 7564-3329 SLP Time Calculation (min) (ACUTE ONLY): 13 min  Assessment / Plan / Recommendation Clinical Impression  Pt consumed solid (Dys 3) and thin via straw while stating why she "was going home today" and she "didn't need to be here." She feels she just had one of her "episodes" and doesn't understand why she is here, especially on this unit. She had small amount of Ativan around 9:30 this am and is awake however not alert. She verbalized while masticating with frequent coughing after liquids/cracker possibly due to reduced laryngeal closure with speech. Oral phase functional with cracker and will upgrade to Dys 2, continue thin with attempts to limit distractions. Will follow.    HPI HPI: 53 y.o. female with medical history significant of PTSD; bipolar d/o; ETOH dependence; and hypothyroidism presenting with AMS.  She was seen by neurology in 6/20 for episodic headache with confusion/delirium, possibly due to complicated migraines vs. Atypical seizure.  She had a normal awake and asleep EEG on 6/17.  EMS was called yesterday evening by her friend due to concerns of recurrent falls, AMS.  She was seen last night by PA Joy, and the patient was alert enough to answer questions (in a belligerent and somewhat nonsensical manner).   There was concern for sepsis and she was given a 1L bolus of LR and Rocephin/Azithromycin after she had fever.  She was found to have an elevated salicylate level and Poison Control was contacted.  They recommended calling nephrology (dialysis not indicated) and following the level.  She was started on a bicarb drip mixed with KCl and D5W.  CXR showed B PNA and O2 sats dropped to 90% so she was started on 2L O2.  She required Ativan and Geodon IM overnight due to periodic severe agitation.        SLP Plan  Continue with current plan of  care       Recommendations  Diet recommendations: Dysphagia 2 (fine chop);Thin liquid Liquids provided via: Cup;Straw Medication Administration: Whole meds with puree Supervision: Patient able to self feed;Full supervision/cueing for compensatory strategies Compensations: Minimize environmental distractions;Slow rate;Small sips/bites Postural Changes and/or Swallow Maneuvers: Seated upright 90 degrees                Oral Care Recommendations: Oral care BID Follow up Recommendations: 24 hour supervision/assistance SLP Visit Diagnosis: Dysphagia, unspecified (R13.10) Plan: Continue with current plan of care       GO                Houston Siren 04/28/2019, 9:58 AM

## 2019-04-28 NOTE — Progress Notes (Addendum)
NAME:  Mary CharterCheryl Wanner, MRN:  [id number], DOB:  05-03-1966, LOS: 6 ADMISSION DATE:  04/21/2019, CONSULTATION DATE:  04/24/2019 REFERRING MD: Hartley BarefootBelkys Regalado, CHIEF COMPLAINT:  AMS  Brief History    Mary Dodson is a 53 y.o. female with medical history significant of PTSD, bipolar d/o, ETOH dependence, and hypothyroidism who presented with AMS, fever, and agitation consistent wit acute encephalopathy. Patient also found to have Acute on Chronic respiratory failure with hypoxia requiring intubation. PCCM consulted for vent management.  History of present illness    Mary CharterCheryl Mckillop is a 53 y.o. female with medical history significant of PTSD; bipolar d/o; ETOH dependence; and hypothyroidism presenting with AMS.  She was seen by neurology in 6/20 for episodic headache with confusion/delirium. She had a normal awake and asleep EEG on 6/17.  EMS was called on 7/12 by her friend due to concerns of recurrent falls, AMS.  She was seen  by PA Joy, and the patient was alert enough to answer questions (in a belligerent and somewhat nonsensical manner).  In the ED, there was concern for sepsis and she was given a 1L bolus of LR and Rocephin/Azithromycin after she had fever. She was found to have an elevated salicylate level and Poison Control was contacted. They recommended calling nephrology (dialysis not indicated) and following the level.  She was started on a bicarb drip mixed with KCl and D5W.  CXR showed B PNA and O2 sats dropped to 90% so she was started on 2L O2.  She required Ativan and Geodon IM in ED due to periodic severe agitation.  She pulled out all of her tubes (foley, multiple IVs).   Of note, she has complained of severe headaches for the last few weeks.  She has complained of feeling like she was in a fog.  She "comes in contact with those people who got that COVID infection."  She said she was taking lots of BC powders.  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  She goes to AA - hasn't had "nothing in about 3 years."    Past Medical History  - Alcohol use disorder, severe dependence - Bipolar disorder, current episode manic severe with psychotic features  - Hypothyroidism (acquired) - PTSD  Significant Hospital Events    04/22/2019 admitted 04/24/2019 ETT 7/17 lumbar puncture, MRI 7/18 extubated  Consults:  Nephrology Neurology PCCM  Procedures:  ETT 04/24/2019 >> 7/18 Lumbar puncture 04/25/2019  Significant Diagnostic Tests:  MRSA PCR > negative SARS-CoV-2 > negative x3  CT Head Wo Contrast 04/22/2019 IMPRESSION: No acute intracranial or cervical spine finding.  CT Cervical Spine Wo Contrast 04/22/2019 IMPRESSION: No acute intracranial or cervical spine finding.  CXR 04/21/2019 Impression: Patchy and indistinct bilateral pulmonary opacity suspicious for bilateral pneumonia. Consider viral/atypical etiology. No pleural Effusion.  MRI results 04/24/2019-reviewed  Micro Data:  UCx 7/14 > recollect (dirty sample) BCx 7/13 > neg UCx 7/16 > neg Resp Cx 7/16 > neg CSF 7/16 > neg Antimicrobials:  Azithromycin - 7/13-7/16 Ceftriaxone - 7/13 >>7/19 Vancomycin - 7/15-7/16  Antivirals:  Acyclovir 7/15- 7/18  Interim history/subjective:  Wants to go home.  Off precedex Remains agitated, but seems to be much better.   Objective   Blood pressure (!) 169/85, pulse 78, temperature 98.1 F (36.7 C), temperature source Oral, resp. rate (!) 22, height 5\' 4"  (  1.626 m), weight 91.5 kg, SpO2 93 %, unknown if currently breastfeeding.        Intake/Output Summary (Last 24 hours) at 04/28/2019 0825 Last data filed at 04/28/2019 0800 Gross per 24 hour  Intake 1167.46 ml  Output 850 ml  Net 317.46 ml   Filed Weights   04/26/19 0500 04/27/19 0500 04/28/19 0500  Weight: 94.3 kg 87.8 kg 91.5 kg   Examination:  General:  Middle aged adult female  Neuro:  Alert, oriented, non-focal. Agitated with somewhat slurred speech.  HEENT:  [place]/AT, No JVD noted, PERRL Cardiovascular:  RRR, no MRG Lungs:  Clear Abdomen:  Soft, non-distended Musculoskeletal:  No acute deformity Skin:  Intact, MMM    Resolved Hospital Problem list     Assessment & Plan:   Acute encephalopathy - Status post LP-no signs of meningitis - Off sedation - Not back to baseline, but nearing.   Bipolar disorder - Reintroduced her home medications 7/19 - Seroquel 400 mg  at night and 100 mg every morning and afternoon. - Zoloft 100 mg daily - Congentin 0.5mg  daily - Gabapentin 400mg  q6h - Psych consult placed. She has been inpatient several times in the recent past.   Acute on chronic respiratory failure with hypoxia - Multifocal infiltrates have been stable - Hypoxemia likely chronic - Continue oxygen supplementation, monitor closely  Suspected PNA - Fever trend is down - Leukocytosis resolved - DC ceftriaxone s/p 7 day course.   Anemia -Trend hematocrit  Metabolic encephalopathy -Concern for salicylate poisoning -Levels improved - supportive care.   History of hypothyroidism -Continue to hold Synthroid  History of alcohol abuse: reportedly clean for 3 years. Withdrawal? - Off precedex. Monitor - Will add scheduled benzodiazepine   Transfer out of ICU, to [facility]   Best practice:  Diet: dys 1 per SLP Pain/Anxiety/Delirium protocol (if indicated): Oral home meds VAP protocol (if indicated): NA DVT prophylaxis: Lovenox GI prophylaxis: PPI Glucose control: None Code Status: FULL Family Communication: Patient updated. Daughter updated.  Disposition: ICU  Labs   CBC: Recent Labs  Lab 04/23/19 0357 04/24/19 1155 04/24/19 1233 04/24/19 1647 04/25/19 0532 04/26/19 0511 04/27/19 0458  WBC 9.3 9.3  --   --  5.7 5.5 6.3  NEUTROABS 6.8 6.9  --   --  3.1 3.0 4.0  HGB 9.3* 9.0* 12.2 8.8* 8.1* 8.9* 9.2*   HCT 28.5* 28.7* 36.0 26.0* 25.6* 28.7* 29.1*  MCV 89.9 92.3  --   --  91.8 95.0 92.7  PLT 219 236  --   --  190 230 626    Basic Metabolic Panel: Recent Labs  Lab 04/23/19 0357  04/24/19 1155 04/24/19 1233 04/24/19 1647 04/25/19 0532 04/26/19 0511 04/26/19 1645 04/27/19 0458 04/27/19 1714  NA 142  --  145 145 144 143 145  --  141  --   K 3.5  --  3.7 3.3* 3.5 3.0* 4.4  --  4.0  --   CL 105  --  105  --   --  102 109  --  103  --   CO2 27  --  32  --   --  33* 29  --  26  --   GLUCOSE 193*  --  136*  --   --  123* 103*  --  89  --   BUN <5*  --  <5*  --   --  <5* 5*  --  6  --   CREATININE 0.58  --  0.47  --   --  0.56 0.65  --  0.61  --   CALCIUM 8.1*  --  8.0*  --   --  7.6* 8.3*  --  8.7*  --   MG 1.7  --  1.7  --   --  1.7 2.0 1.9 1.9 1.9  PHOS 1.4*   < > 2.5  --   --  2.9 3.8 5.2* 4.9* 4.8*   < > = values in this interval not displayed.   GFR: Estimated Creatinine Clearance: 90.1 mL/min (by C-G formula based on SCr of 0.61 mg/dL). Recent Labs  Lab 04/21/19 2145 04/22/19 0104  04/24/19 1155 04/25/19 0532 04/26/19 0511 04/27/19 0458  PROCALCITON  --  <0.10  --   --   --   --   --   WBC  --   --    < > 9.3 5.7 5.5 6.3  LATICACIDVEN 0.8 0.7  --   --   --   --   --    < > = values in this interval not displayed.    Liver Function Tests: Recent Labs  Lab 04/23/19 0357 04/24/19 1155 04/25/19 0532 04/26/19 0511 04/27/19 0458  AST 49* 34 24 26 25   ALT 22 22 21 21 23   ALKPHOS 71 68 85 78 89  BILITOT 0.4 0.2* 0.4 <0.1* 0.4  PROT 5.6* 5.2* 4.4* 4.9* 5.4*  ALBUMIN 2.8* 2.6* 2.1* 2.1* 2.4*   No results for input(s): LIPASE, AMYLASE in the last 168 hours. Recent Labs  Lab 04/21/19 2121 04/22/19 1908  AMMONIA 26 25    ABG    Component Value Date/Time   PHART 7.447 04/24/2019 1647   PCO2ART 50.9 (H) 04/24/2019 1647   PO2ART 70.0 (L) 04/24/2019 1647   HCO3 35.0 (H) 04/24/2019 1647   TCO2 37 (H) 04/24/2019 1647   ACIDBASEDEF 6.0 (H) 04/22/2019 0011    O2SAT 94.0 04/24/2019 1647     Coagulation Profile: Recent Labs  Lab 04/22/19 0104  INR 1.6*    Cardiac Enzymes: No results for input(s): CKTOTAL, CKMB, CKMBINDEX, TROPONINI in the last 168 hours.  HbA1C: Hgb A1c MFr Bld  Date/Time Value Ref Range Status  10/03/2016 06:25 AM 5.4 4.8 - 5.6 % Final    Comment:    (NOTE)         Pre-diabetes: 5.7 - 6.4         Diabetes: >6.4         Glycemic control for adults with diabetes: <7.0     CBG: Recent Labs  Lab 04/26/19 1535 04/26/19 1915 04/26/19 2312 04/27/19 0323 04/27/19 0730  GLUCAP 88 95 78 81 82     Joneen RoachPaul Hoffman, AGACNP-BC [facility] Pager [phone] or [phone]  04/28/2019 8:34 AM

## 2019-04-28 NOTE — Progress Notes (Signed)
Note from psych service noted No indication /factors necessitating inpatient psych treatment  Patient is sitting with a sitter at present  Has been insisting on wanting to get out of the hospital  She is on the phone, having a prolonged conversation with a friend  Not medically ready to be discharged however if she insists to go home, we cannot hold her against her will  Spoke with her friend on the phone  Stated patient has not had a drink in over 2 and a half years  She will come and pick the patient up if she wants to leave to go home with her  I do not believe she is medically ready for discharge   May go home AMA

## 2019-04-29 LAB — VITAMIN C: Vitamin C: 0.4 mg/dL (ref 0.4–2.0)

## 2019-04-29 NOTE — Progress Notes (Signed)
Patient Alert and oriented to place, age, incorrectly stated month.  Able to name last 4 presidents correctly.  Very agitated and requesting multiple times to leave.  She states that this happens to her every few months.  MRI brain negative for acute findings.  LP negative for infection.  EEG negative for epileptiform activity. Suspect acute toxic encephalopathy, possibly from alcohol withdrawal, medications versus behavioral disturbance from underlying psychiatric disorder.  Recommend psych consult.  Neurology will be available as needed.

## 2019-05-04 ENCOUNTER — Emergency Department (HOSPITAL_COMMUNITY): Payer: Self-pay

## 2019-05-04 ENCOUNTER — Emergency Department (HOSPITAL_COMMUNITY)
Admission: EM | Admit: 2019-05-04 | Discharge: 2019-05-04 | Disposition: A | Discharge status: Home / Self Care | Payer: Self-pay | Attending: Emergency Medicine | Admitting: Emergency Medicine

## 2019-05-04 ENCOUNTER — Encounter (HOSPITAL_COMMUNITY): Payer: Self-pay | Admitting: Emergency Medicine

## 2019-05-04 ENCOUNTER — Other Ambulatory Visit: Payer: Self-pay

## 2019-05-04 DIAGNOSIS — S92344A Nondisplaced fracture of fourth metatarsal bone, right foot, initial encounter for closed fracture: Secondary | ICD-10-CM | POA: Insufficient documentation

## 2019-05-04 DIAGNOSIS — Z79899 Other long term (current) drug therapy: Secondary | ICD-10-CM | POA: Insufficient documentation

## 2019-05-04 DIAGNOSIS — Y939 Activity, unspecified: Secondary | ICD-10-CM | POA: Insufficient documentation

## 2019-05-04 DIAGNOSIS — E039 Hypothyroidism, unspecified: Secondary | ICD-10-CM | POA: Insufficient documentation

## 2019-05-04 DIAGNOSIS — Y999 Unspecified external cause status: Secondary | ICD-10-CM | POA: Insufficient documentation

## 2019-05-04 DIAGNOSIS — S92334A Nondisplaced fracture of third metatarsal bone, right foot, initial encounter for closed fracture: Secondary | ICD-10-CM | POA: Insufficient documentation

## 2019-05-04 DIAGNOSIS — S92324A Nondisplaced fracture of second metatarsal bone, right foot, initial encounter for closed fracture: Secondary | ICD-10-CM | POA: Insufficient documentation

## 2019-05-04 DIAGNOSIS — Y929 Unspecified place or not applicable: Secondary | ICD-10-CM | POA: Insufficient documentation

## 2019-05-04 DIAGNOSIS — X58XXXA Exposure to other specified factors, initial encounter: Secondary | ICD-10-CM | POA: Insufficient documentation

## 2019-05-04 DIAGNOSIS — J449 Chronic obstructive pulmonary disease, unspecified: Secondary | ICD-10-CM | POA: Insufficient documentation

## 2019-05-04 DIAGNOSIS — F17210 Nicotine dependence, cigarettes, uncomplicated: Secondary | ICD-10-CM | POA: Insufficient documentation

## 2019-05-04 NOTE — ED Notes (Signed)
ED Provider at bedside. 

## 2019-05-04 NOTE — Discharge Instructions (Signed)
You have been seen today for a foot injury.  There were fractures noted to 3 of the foot bones. Antiinflammatory medications: Take 600 mg of ibuprofen every 6 hours or 440 mg (over the counter dose) to 500 mg (prescription dose) of naproxen every 12 hours for the next 3 days. After this time, these medications may be used as needed for pain. Take these medications with food to avoid upset stomach. Choose only one of these medications, do not take them together. Do not take either the other above medications at the same time as aspirin-containing medications, such as BC or Goody powders. Acetaminophen (generic for Tylenol): Should you continue to have additional pain while taking the ibuprofen or naproxen, you may add in acetaminophen as needed. Your daily total maximum amount of acetaminophen from all sources should be limited to 4000mg /day for persons without liver problems, or 2000mg /day for those with liver problems. Ice: May apply ice to the area over the next 24 hours for 15 minutes at a time to reduce swelling. Elevation: Keep the extremity elevated as often as possible to reduce pain and inflammation. Support: Wear the boot for support and comfort. Wear this until pain resolves, or until told otherwise by the orthopedist.  You should be nonweightbearing as much as possible. Follow up: Follow-up with the orthopedic specialist for any further management of this issue.  Call the number provided to set up an appointment. Return: Return to the ED for numbness, weakness, increasing pain, overall worsening symptoms, loss of function, or if symptoms are not improving, you have tried to follow up with the orthopedic specialist, and have been unable to do so.  For prescription assistance, may try using prescription discount sites or apps, such as goodrx.com

## 2019-05-04 NOTE — ED Notes (Signed)
Patient transported to X-ray 

## 2019-05-04 NOTE — ED Triage Notes (Signed)
Patient c/o right foot pain. States "before I had that episode of confusion last week I hurt it." Reports recently intubated and admitted at [facility]. Service dog at bedside.

## 2019-05-04 NOTE — ED Provider Notes (Signed)
[facility] DEPT Provider Note   CSN: [id number] Arrival date & time: 05/04/19  1120    History   Chief Complaint Chief Complaint  Patient presents with   Foot Pain    HPI Mary Dodson is a 53 y.o. female.     HPI   Mary Dodson is a 53 y.o. female, with a history of alcohol abuse, bipolar, hypothyroidism, presenting to the ED with right foot pain that she thinks may have come from an injury around July 12.  She states she was getting confused at the time and may have hit her foot.  She was then hospitalized July 13 with altered mental status. Her pain in the right foot is mostly on the lateral side, throbbing, 8/10, worse with ambulation, nonradiating.  Denies numbness, weakness, fever/chills, calf pain/swelling, shortness of breath, chest pain, orthopnea, or any other complaints.    Past Medical History:  Diagnosis Date   Alcohol abuse    Bipolar disease, chronic ([place])    Hypothyroidism (acquired)    PTSD (post-traumatic stress disorder)     Patient Active Problem List   Diagnosis Date Noted   Altered mental status    Acute on chronic respiratory failure with hypoxia ([place]) 16/04/3709   Salicylate poisoning 62/69/4854   Alcohol abuse    Bipolar disease, chronic ([place])    Hypothyroidism (acquired)    ASCUS of cervix with negative high risk HPV 62/70/3500   Helicobacter pylori gastritis 03/19/2018   Hypothyroidism 03/19/2018   Bipolar disorder, current episode manic severe with psychotic features ([place]) 10/06/2016   Alcohol use disorder, severe, dependence ([place]) 10/06/2016   Alcohol use disorder, severe, dependence ([place]) 09/26/2016   Major depressive disorder with single episode 09/26/2016   Acute encephalopathy 09/20/2016   Airway intubation performed without difficulty    Hypothermia    Alcoholic intoxication with complication ([place])    COPD with acute exacerbation ([place])     History reviewed. No pertinent  surgical history.   OB History    Gravida  4   Para  0   Term  0   Preterm  0   AB  1   Living        SAB  1   TAB  0   Ectopic  0   Multiple      Live Births  2            Home Medications    Prior to Admission medications   Medication Sig Start Date End Date Taking? Authorizing Provider  albuterol (VENTOLIN HFA) 108 (90 Base) MCG/ACT inhaler Inhale 2 puffs into the lungs every 6 (six) hours as needed for wheezing or shortness of breath.    [provider]  Amino Acids (AMINO ACID PO) Take 1 tablet by mouth daily.    [provider]  Aspirin-Salicylamide-Caffeine (BC HEADACHE PO) Take 6-7 packets by mouth daily as needed (headache).    [provider]  benztropine (COGENTIN) 0.5 MG tablet Take 0.5 mg by mouth at bedtime. 02/20/18   [provider]  calcium carbonate (OSCAL) 1500 (600 Ca) MG TABS tablet Take 600 mg of elemental calcium by mouth daily with breakfast.    [provider]  doxepin (SINEQUAN) 75 MG capsule Take 75 mg by mouth at bedtime as needed (sleep).  04/07/19   [provider]  gabapentin (NEURONTIN) 400 MG capsule Take 2 capsules (800 mg total) by mouth 4 (four) times daily -  with meals and at bedtime.  Patient taking differently: Take 400 mg by mouth 4 (four) times daily -  with meals and at bedtime.  10/06/16   Oneta RackLewis, Tanika N, NP  ibuprofen (ADVIL) 800 MG tablet Take 800 mg by mouth 3 (three) times daily as needed for fever or headache (pain).  04/17/19   [provider]  Multiple Vitamins-Minerals (MULTIVITAMIN WITH MINERALS) tablet Take 1 tablet by mouth daily.    [provider]  Omega-3 Fatty Acids (FISH OIL OMEGA-3 PO) Take 1 tablet by mouth daily.    [provider]  omeprazole (PRILOSEC) 40 MG capsule Take 40 mg by mouth daily. 05/27/18   [provider]  promethazine (PHENERGAN) 25 MG tablet Take 25 mg by mouth daily as needed for nausea or vomiting.   05/27/18   [provider]  QUEtiapine (SEROQUEL) 100 MG tablet Take 1 tablet (100 mg total) by mouth 2 (two) times daily. Patient taking differently: Take 100 mg by mouth 2 (two) times daily with breakfast and lunch. Also take 400 mg at bedtime 10/06/16   Oneta RackLewis, Tanika N, NP  QUEtiapine (SEROQUEL) 400 MG tablet Take 400 mg by mouth at bedtime. Also take 100 mg with breakfast and lunch 04/14/19   [provider]  sertraline (ZOLOFT) 100 MG tablet Take 100 mg by mouth daily after breakfast. 04/14/19   [provider]  SYNTHROID 50 MCG tablet Take 1 tablet (50 mcg total) by mouth daily. 03/20/18   Hoy RegisterNewlin, Enobong, MD    Family History Family History  Problem Relation Age of Onset   Cancer Mother        suicide   Breast cancer Maternal Grandmother    Cancer Maternal Grandmother    Heart attack Father     Social History Social History   Tobacco Use   Smoking status: Current Every Day Smoker    Packs/day: 1.00    Types: Cigarettes   Smokeless tobacco: Never Used  Substance Use Topics   Alcohol use: Not Currently    Comment: recovering alcoholic   Drug use: No     Allergies   Morphine and related, Morphine and related, Morphine and related, and Trazodone and nefazodone   Review of Systems Review of Systems  Constitutional: Negative for chills and fever.  Musculoskeletal: Positive for arthralgias.  Neurological: Negative for weakness and numbness.     Physical Exam Updated Vital Signs BP (!) 126/111 (BP Location: Left Arm)    Pulse 83    Temp 98.2 F (36.8 C) (Oral)    Resp 18    SpO2 95%   Physical Exam Vitals signs and nursing note reviewed.  Constitutional:      General: She is not in acute distress.    Appearance: She is well-developed. She is not diaphoretic.  HENT:     Head: Normocephalic and atraumatic.  Eyes:     Conjunctiva/sclera: Conjunctivae normal.  Neck:     Musculoskeletal: Neck supple.  Cardiovascular:     Rate and  Rhythm: Normal rate and regular rhythm.  Pulmonary:     Effort: Pulmonary effort is normal.  Musculoskeletal:     Right lower leg: Edema present.     Left lower leg: Edema present.       Feet:     Comments: Tenderness over the dorsal right lateral foot.  There is swelling throughout the right foot, but no other noted tenderness.  No tenderness to the right ankle.  There is pretibial, pitting edema distally, bilaterally.  She states this  is not unusual for her.  She also has a ring of erythema and around her sock line around the right ankle.  This area is nontender.  She states that is been there for several days, is not painful, has not been spreading.  Skin:    General: Skin is warm and dry.     Coloration: Skin is not pale.  Neurological:     Mental Status: She is alert.  Psychiatric:        Behavior: Behavior normal.      ED Treatments / Results  Labs (all labs ordered are listed, but only abnormal results are displayed) Labs Reviewed - No data to display  EKG None  Radiology Dg Foot Complete Right  Result Date: 05/04/2019 CLINICAL DATA:  Lateral foot injury last week. EXAM: RIGHT FOOT COMPLETE - 3+ VIEW COMPARISON:  None. FINDINGS: Nondisplaced fractures of the second, third, and fourth metatarsal necks with trace impaction. Mild dorsal soft tissue swelling. No dislocation. IMPRESSION: Nondisplaced fractures of the second, third, and fourth metatarsal necks. Electronically Signed   By: Marnee SpringJonathon  Watts M.D.   On: 05/04/2019 12:47    Procedures Procedures (including critical care time)  Medications Ordered in ED Medications - No data to display   Initial Impression / Assessment and Plan / ED Course  I have reviewed the triage vital signs and the nursing notes.  Pertinent labs & imaging results that were available during my care of the patient were reviewed by me and considered in my medical decision making (see chart for details).         Patient presents with  pain to the right foot. There is swelling and point tenderness to the right foot on exam today.  She also has some pretibial edema, equal bilaterally.  There is no tenderness, swelling, increased warmth, or color change to the calves or over the deep venous system bilaterally.  She does have evidence of nondisplaced fractures of the second, third, and fourth metatarsal necks on x-ray.  No evidence of neurovascular compromise. Orthopedic follow-up.  Placed in cam boot and given crutches. The patient was given instructions for home care as well as return precautions. Patient voices understanding of these instructions, accepts the plan, and is comfortable with discharge.   Note: Patient was insistent that I placed the following information in her chart: "I am sure that my episode of confusion during my last admission was due to Hashimoto's thyroid dysfunction.  I have done a lot of my own research and have narrowed it down to Hashimoto's or mad cow disease.  I have an appointment with the endocrinologist coming up, however, I want it noted in my chart that I have said I have a suspicion of Hashimoto's."  I discussed the lab work findings from her most previous admission, including her low TSH.  Patient is still convinced her episode was caused by Hashimoto's.  I told her I would defer to the endocrinologist expertise on this matter.  Final Clinical Impressions(s) / ED Diagnoses   Final diagnoses:  Closed nondisplaced fracture of second metatarsal bone of right foot, initial encounter  Closed nondisplaced fracture of third metatarsal bone of right foot, initial encounter  Closed nondisplaced fracture of fourth metatarsal bone of right foot, initial encounter    ED Discharge Orders    None       Concepcion LivingJoy, Maxden Naji C, PA-C 05/04/19 1313    Linwood DibblesKnapp, Jon, MD 05/05/19 1244

## 2019-05-09 ENCOUNTER — Telehealth: Payer: Self-pay | Admitting: Neurology

## 2019-05-09 DIAGNOSIS — R404 Transient alteration of awareness: Secondary | ICD-10-CM

## 2019-05-09 NOTE — Telephone Encounter (Signed)
Pt wants to speak to someone about so information that she found please call

## 2019-05-12 NOTE — Telephone Encounter (Signed)
Pt states was in hospital 7/13 - 20th for episodes that she states left her in a coma.  She feels like no one is taking her seriously, and wants to be refereed to the Auto-immune brain disease clinic at [facility]. She called there and was told if we stated it was urgent, she could be seen sooner.

## 2019-05-13 NOTE — Telephone Encounter (Signed)
We can place a referral to [facility] for patient requesting a second opinion but I wouldn't say it is urgent.

## 2019-05-14 NOTE — Telephone Encounter (Signed)
Referral faxed to [facility] 

## 2019-05-14 NOTE — Addendum Note (Signed)
Addended by: Clois Comber on: 05/14/2019 01:21 PM   Modules accepted: Orders

## 2019-05-16 LAB — ARBOVIRUS PANEL, ~~LOC~~ LAB
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
## 2019-05-26 ENCOUNTER — Telehealth: Payer: Self-pay | Admitting: Neurology

## 2019-05-26 NOTE — Telephone Encounter (Signed)
Called and spoke with Lambert Keto, she said it was her co-worker Shirlean Mylar who called. I advised her the referral was good, the Pt was the one who requested a 2nd opinion to [facility], specifically to the auto-immune brain disease clinic. Lambert Keto will let Shirlean Mylar know.

## 2019-05-26 NOTE — Telephone Encounter (Signed)
[facility] is calling in on this patient about referral. She said when she called patient to schedule that the patient was thinking she needed a referral for ENDOcrinology and not NEUROlogy. There is some confusion and she is trying to figure out if she needs to schedule this. Please call [facility] back at [phone]. Thanks!

## 2019-05-28 NOTE — Telephone Encounter (Signed)
Shirlean Mylar returned my call. She said her physician reviewed the notes and has declined the referral.  Received a call from [place] at [place], with Pt's PCP. She was asking if we referred Pt for a 2nd opinion. I explained that the Pt requested a 2nd opinion and Dr. Tomi Likens agreed to forward to [facility] as Pt requested, but that the dr at [facility] had declined the referral.
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
## 2019-06-03 ENCOUNTER — Ambulatory Visit: Payer: Self-pay | Admitting: Neurology

## 2020-01-27 ENCOUNTER — Emergency Department: Payer: No Typology Code available for payment source

## 2020-01-27 ENCOUNTER — Encounter: Payer: Self-pay | Admitting: Emergency Medicine

## 2020-01-27 ENCOUNTER — Other Ambulatory Visit: Payer: Self-pay

## 2020-01-27 ENCOUNTER — Emergency Department
Admission: EM | Admit: 2020-01-27 | Discharge: 2020-01-27 | Disposition: A | Discharge status: Home / Self Care | Payer: No Typology Code available for payment source | Attending: Emergency Medicine | Admitting: Emergency Medicine

## 2020-01-27 DIAGNOSIS — M25562 Pain in left knee: Secondary | ICD-10-CM | POA: Insufficient documentation

## 2020-01-27 DIAGNOSIS — E039 Hypothyroidism, unspecified: Secondary | ICD-10-CM | POA: Insufficient documentation

## 2020-01-27 DIAGNOSIS — Y93I9 Activity, other involving external motion: Secondary | ICD-10-CM | POA: Diagnosis not present

## 2020-01-27 DIAGNOSIS — J449 Chronic obstructive pulmonary disease, unspecified: Secondary | ICD-10-CM | POA: Diagnosis not present

## 2020-01-27 DIAGNOSIS — F17210 Nicotine dependence, cigarettes, uncomplicated: Secondary | ICD-10-CM | POA: Insufficient documentation

## 2020-01-27 DIAGNOSIS — Y92410 Unspecified street and highway as the place of occurrence of the external cause: Secondary | ICD-10-CM | POA: Insufficient documentation

## 2020-01-27 DIAGNOSIS — S82832A Other fracture of upper and lower end of left fibula, initial encounter for closed fracture: Secondary | ICD-10-CM | POA: Insufficient documentation

## 2020-01-27 DIAGNOSIS — Y999 Unspecified external cause status: Secondary | ICD-10-CM | POA: Insufficient documentation

## 2020-01-27 DIAGNOSIS — Z79899 Other long term (current) drug therapy: Secondary | ICD-10-CM | POA: Diagnosis not present

## 2020-01-27 DIAGNOSIS — S99912A Unspecified injury of left ankle, initial encounter: Secondary | ICD-10-CM | POA: Diagnosis present

## 2020-01-27 MED ORDER — MELOXICAM 15 MG PO TABS: 15.0000 mg | ORAL_TABLET | Freq: Every day | ORAL | 0 refills | Status: DC

## 2020-01-27 MED ORDER — HYDROCODONE-ACETAMINOPHEN 5-325 MG PO TABS
1.0000 | ORAL_TABLET | Freq: Once | ORAL | Status: AC
  Administered 2020-01-27: 1 via ORAL
  Filled 2020-01-27: qty 1

## 2020-01-27 MED ORDER — HYDROCODONE-ACETAMINOPHEN 5-325 MG PO TABS: 1.0000 | ORAL_TABLET | ORAL | 0 refills | Status: AC | PRN

## 2020-01-27 MED ORDER — METHOCARBAMOL 500 MG PO TABS: 500.0000 mg | ORAL_TABLET | Freq: Four times a day (QID) | ORAL | 0 refills | Status: AC
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
## 2020-01-27 NOTE — ED Notes (Addendum)
ROOM 43

## 2020-02-20 ENCOUNTER — Telehealth: Payer: Self-pay

## 2020-02-20 NOTE — Telephone Encounter (Signed)
Patient called and requested a return call regarding scheduling a [facility] appointment. Left message on identifying voicemail requesting patient to return call to office.

## 2020-02-25 ENCOUNTER — Other Ambulatory Visit: Payer: Self-pay

## 2020-02-25 DIAGNOSIS — Z1231 Encounter for screening mammogram for malignant neoplasm of breast: Secondary | ICD-10-CM

## 2020-03-07 ENCOUNTER — Emergency Department (HOSPITAL_COMMUNITY): Payer: Self-pay

## 2020-03-07 ENCOUNTER — Emergency Department (HOSPITAL_COMMUNITY)
Admission: EM | Admit: 2020-03-07 | Discharge: 2020-03-07 | Disposition: A | Discharge status: Home / Self Care | Payer: Self-pay | Attending: Emergency Medicine | Admitting: Emergency Medicine

## 2020-03-07 ENCOUNTER — Encounter (HOSPITAL_COMMUNITY): Payer: Self-pay

## 2020-03-07 ENCOUNTER — Other Ambulatory Visit: Payer: Self-pay

## 2020-03-07 DIAGNOSIS — F17210 Nicotine dependence, cigarettes, uncomplicated: Secondary | ICD-10-CM | POA: Insufficient documentation

## 2020-03-07 DIAGNOSIS — S8265XD Nondisplaced fracture of lateral malleolus of left fibula, subsequent encounter for closed fracture with routine healing: Secondary | ICD-10-CM | POA: Insufficient documentation

## 2020-03-07 DIAGNOSIS — S92405D Nondisplaced unspecified fracture of left great toe, subsequent encounter for fracture with routine healing: Secondary | ICD-10-CM | POA: Insufficient documentation

## 2020-03-07 DIAGNOSIS — M79672 Pain in left foot: Secondary | ICD-10-CM | POA: Insufficient documentation

## 2020-03-07 DIAGNOSIS — M25572 Pain in left ankle and joints of left foot: Secondary | ICD-10-CM | POA: Insufficient documentation

## 2020-03-07 MED ORDER — HYDROCODONE-ACETAMINOPHEN 5-325 MG PO TABS
1.0000 | ORAL_TABLET | Freq: Once | ORAL | Status: AC
  Administered 2020-03-07: 1 via ORAL
  Filled 2020-03-07: qty 1

## 2020-03-07 MED ORDER — MELOXICAM 15 MG PO TABS: 15.0000 mg | ORAL_TABLET | Freq: Every day | ORAL | 0 refills | Status: DC

## 2020-03-07 NOTE — ED Notes (Signed)
An After Visit Summary was printed and given to the patient. Discharge instructions given and no further questions at this time.  

## 2020-03-07 NOTE — ED Triage Notes (Signed)
Pt states she was seen at [facility] ED 6 weeks ago and was diagnosed with a broken left foot. Pt states her pain is worse and left foot is not healing. Pt ambulated to room from lobby.
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
## 2020-03-07 NOTE — Discharge Instructions (Addendum)
1. Medications: Take meloxicam once daily with meals.  Take with meals to avoid upset stomach issues.  Stop taking this medication if you develop any upset stomach issues including abdominal pain, vomiting blood or blood in stool.  Stop taking BC powder.  You can take extra strength Tylenol every 6 hours as needed for pain additionally.  Do not exceed more than 4000 mg of Tylenol daily. 2. Treatment: rest, apply ice or heat whichever feels better, elevate and use brace, drink plenty of fluids, gentle stretching 3. Follow Up: Please followup with orthopedics as directed or your PCP as soon as possible for discussion of your diagnoses and further evaluation after today's visit; Please return to the ER for worsening symptoms or other concerns such as worsening swelling, redness of the skin, fevers, loss of pulses, or loss of feeling

## 2020-03-15 ENCOUNTER — Telehealth: Payer: Self-pay

## 2020-03-15 NOTE — Telephone Encounter (Signed)
Returned patient's phone call about upcoming [facility] appointment. Left appointment information and address on patient's vociemail. Left name and number if patient had any other questions.

## 2020-03-17 ENCOUNTER — Encounter (HOSPITAL_COMMUNITY): Payer: Self-pay | Admitting: Psychiatry

## 2020-03-17 ENCOUNTER — Other Ambulatory Visit: Payer: Self-pay

## 2020-03-17 ENCOUNTER — Ambulatory Visit (INDEPENDENT_AMBULATORY_CARE_PROVIDER_SITE_OTHER): Payer: No Payment, Other | Admitting: Psychiatry

## 2020-03-17 ENCOUNTER — Ambulatory Visit (INDEPENDENT_AMBULATORY_CARE_PROVIDER_SITE_OTHER): Payer: No Payment, Other | Admitting: Clinical

## 2020-03-17 DIAGNOSIS — F324 Major depressive disorder, single episode, in partial remission: Secondary | ICD-10-CM | POA: Diagnosis not present

## 2020-03-17 DIAGNOSIS — L299 Pruritus, unspecified: Secondary | ICD-10-CM

## 2020-03-17 DIAGNOSIS — F1021 Alcohol dependence, in remission: Secondary | ICD-10-CM

## 2020-03-17 DIAGNOSIS — F3110 Bipolar disorder, current episode manic without psychotic features, unspecified: Secondary | ICD-10-CM | POA: Diagnosis not present

## 2020-03-17 DIAGNOSIS — F312 Bipolar disorder, current episode manic severe with psychotic features: Secondary | ICD-10-CM | POA: Diagnosis not present
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
## 2020-03-18 ENCOUNTER — Encounter (HOSPITAL_COMMUNITY): Payer: Self-pay | Admitting: Psychiatry

## 2020-03-18 MED ORDER — SERTRALINE HCL 100 MG PO TABS: 100.0000 mg | ORAL_TABLET | Freq: Every day | ORAL | 1 refills | Status: DC

## 2020-03-18 MED ORDER — BENZTROPINE MESYLATE 0.5 MG PO TABS: 0.5000 mg | ORAL_TABLET | Freq: Every day | ORAL | 1 refills | Status: DC

## 2020-03-18 MED ORDER — QUETIAPINE FUMARATE 100 MG PO TABS: 100.0000 mg | ORAL_TABLET | Freq: Two times a day (BID) | ORAL | 1 refills | Status: DC

## 2020-03-18 MED ORDER — QUETIAPINE FUMARATE 400 MG PO TABS: 400.0000 mg | ORAL_TABLET | Freq: Every day | ORAL | 1 refills | Status: DC
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
## 2020-03-19 DIAGNOSIS — F3110 Bipolar disorder, current episode manic without psychotic features, unspecified: Secondary | ICD-10-CM | POA: Insufficient documentation

## 2020-03-25 ENCOUNTER — Ambulatory Visit: Payer: No Typology Code available for payment source

## 2020-03-31 ENCOUNTER — Other Ambulatory Visit: Payer: Self-pay

## 2020-03-31 ENCOUNTER — Ambulatory Visit (INDEPENDENT_AMBULATORY_CARE_PROVIDER_SITE_OTHER): Payer: No Payment, Other | Admitting: Clinical

## 2020-03-31 DIAGNOSIS — F312 Bipolar disorder, current episode manic severe with psychotic features: Secondary | ICD-10-CM

## 2020-04-01 NOTE — Progress Notes (Signed)
   THERAPIST PROGRESS NOTE  Session Time: 53 minutes  Participation Level: Active  Behavioral Response: CasualAlertEuthymic  Type of Therapy: Individual Therapy  Treatment Goals addressed: Coping  Interventions: CBT  Summary:  Mary Dodson is a 54 y.o. female who presents for scheduled session oriented times five, appropriately dressed, and friendly. Client denies hallucinations and delusions.  Client reported on today she is doing okay. Client reported she has ongoing appointments for neurologic testing at [facility] for episodes of brain fog and being a fall risk.   Client reported she has begun attending AA meetings again with her sponsor.  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 Client discussed her preoccupations with politics and how stressful it is to watch them but she stays up to date. Client discussed her relationship with her daughters. Client reported her youngest daughter calls her to console about different things.    Suicidal/Homicidal: Nowithout intent/plan  Therapist Response:  Therapist checked in with the client by allowing time to focus on the clients thoughts and feelings. Therapist continued to build a therapeutic relationship with the client by active listening and empathy. Therapist collaborated with the client to discuss maintaining good physical health and utilizing positive support.    Plan: Return again in 3 weeks for individual therapy. Client was scheduled for next session.      Neena Rhymes Kiondre Grenz, LCSW 04/01/2020

## 2020-04-13 ENCOUNTER — Ambulatory Visit: Payer: Self-pay | Admitting: *Deleted

## 2020-04-13 ENCOUNTER — Other Ambulatory Visit: Payer: Self-pay

## 2020-04-13 ENCOUNTER — Ambulatory Visit
Admission: RE | Admit: 2020-04-13 | Discharge: 2020-04-13 | Disposition: A | Discharge status: Home / Self Care | Payer: No Typology Code available for payment source | Source: Ambulatory Visit | Attending: Obstetrics and Gynecology | Admitting: Obstetrics and Gynecology

## 2020-04-13 VITALS — BP 110/76 | Temp 98.0°F | Wt 211.9 lb

## 2020-04-13 DIAGNOSIS — Z124 Encounter for screening for malignant neoplasm of cervix: Secondary | ICD-10-CM

## 2020-04-13 DIAGNOSIS — Z1231 Encounter for screening mammogram for malignant neoplasm of breast: Secondary | ICD-10-CM

## 2020-04-13 DIAGNOSIS — Z01419 Encounter for gynecological examination (general) (routine) without abnormal findings: Secondary | ICD-10-CM

## 2020-04-13 NOTE — Progress Notes (Signed)
[name] is a 54 y.o. [id number] female who presents to [facility] clinic today with no complaints.    Pap Smear: Pap smear completed today. Last Pap smear was 03/07/2018 at [facility] clinic and it showed ASC-US with negative HPV. Per patient has an extensive history of an abnormal Pap smear. Last Pap smear result is available in Epic.   Physical exam: Breasts Breasts symmetrical. No skin abnormalities bilateral breasts. No nipple retraction bilateral breasts. No nipple discharge bilateral breasts. No lymphadenopathy. No lumps palpated bilateral breasts. She denies pain.      Pelvic/Bimanual Ext Genitalia No lesions, no swelling and no discharge observed on external genitalia.        Vagina Vagina pink and normal texture. No lesions or discharge observed in vagina.        Cervix Cervix is present. Cervix pink and of normal texture. No discharge observed.    Uterus Uterus is present and palpable. Uterus in normal position and normal size.        Adnexae Bilateral ovaries present and palpable. No tenderness on palpation.         Rectovaginal No rectal exam completed today since patient had no rectal complaints. No skin abnormalities observed on exam.     Smoking History: Patient is a former smoker but currently vapes. She was referred to the [facility] Quit line.    Patient Navigation: Patient education provided. Access to services provided for patient through [facility] program.   Colorectal Cancer Screening: Per patient has had a colonoscopy in 2017 and it was normal. No complaints today.    Breast and Cervical Cancer Risk Assessment: Patient has family history of breast cancer in her maternal grandmother. No known genetic mutations, or radiation treatment to the chest before age 53. Patient does not have history of cervical dysplasia, immunocompromised, or DES exposure in-utero.  Risk Assessment    Risk Scores      04/13/2020 03/07/2018   Last edited by: Meryl Dare, CMA [place],  Carlye Grippe, RN   5-year risk: 2.2 % 2 %   Lifetime risk: 16.5 % 17 %          A: BCCCP exam with pap smear No complaints today.  P: Referred patient to the [facility] mobile unit for a screening mammogram. Appointment scheduled 04/13/2020 2:00pm.  Mila Homer, RN, FNP student 04/13/2020 1:54 PM   Attestation of Supervision of Student:  I confirm that I have verified the information documented in the nurse practitioner student's note and that I have also personally reperformed the history, physical exam and all medical decision making activities.  I have verified that all services and findings are accurately documented in this student's note; and I agree with management and plan as outlined in the documentation. I have also made any necessary editorial changes.  Last Pap smear was 03/07/2018 at [facility] clinic and ASCUS with negative HPV. Patients previous Pap smear was 08/01/2017 at the [facility] and ASCUS. Per patient she has had three other abnormal Pap smears when she was 22, 36, and 45. Patient states she thinks she had a colposcopy to follow-up after the abnormal Pap smear when she was 45. No follow-up was completed after the last two abnormal Pap smears.  [place], Kathaleen Maser, RN [facility] [employer], [facility] 04/13/2020 4:31 PM

## 2020-04-13 NOTE — Patient Instructions (Addendum)
Informed Mary Dodson about breast self awareness. Patient received a Pap smear today. Let her know BCCCP will cover Pap smears and HPV typing every 5 years unless has a history of abnormal Pap smears. Referred patient to the [facility] mobile unit for screening mammogram. Appointment scheduled for April 13, 2020 at 2:00pm. Patient aware of appointment and will be there. Let patient know will follow up with her within the next couple weeks with results of Pap smear by letter or phone. Informed patient that the Breast Center will follow-up with her within the next couple of weeks with results of mammogram by letter or phone. Mary Dodson verbalized understanding.  Mary Homer, RN, FNP student 1:59 PM

## 2020-04-15 ENCOUNTER — Encounter (HOSPITAL_COMMUNITY): Payer: Self-pay | Admitting: Psychiatry

## 2020-04-15 ENCOUNTER — Other Ambulatory Visit: Payer: Self-pay

## 2020-04-15 ENCOUNTER — Ambulatory Visit (INDEPENDENT_AMBULATORY_CARE_PROVIDER_SITE_OTHER): Payer: No Payment, Other | Admitting: Psychiatry

## 2020-04-15 DIAGNOSIS — F319 Bipolar disorder, unspecified: Secondary | ICD-10-CM | POA: Diagnosis not present

## 2020-04-15 DIAGNOSIS — F324 Major depressive disorder, single episode, in partial remission: Secondary | ICD-10-CM | POA: Diagnosis not present

## 2020-04-15 LAB — CYTOLOGY - PAP
Comment: NEGATIVE
Diagnosis: NEGATIVE
Diagnosis: REACTIVE
High risk HPV: NEGATIVE

## 2020-04-15 MED ORDER — QUETIAPINE FUMARATE 100 MG PO TABS: 100.0000 mg | ORAL_TABLET | Freq: Two times a day (BID) | ORAL | 1 refills | Status: DC

## 2020-04-15 MED ORDER — HYDROXYZINE HCL 10 MG PO TABS: 10.0000 mg | ORAL_TABLET | Freq: Three times a day (TID) | ORAL | 1 refills | Status: DC | PRN

## 2020-04-15 MED ORDER — SERTRALINE HCL 100 MG PO TABS: 100.0000 mg | ORAL_TABLET | Freq: Every day | ORAL | 1 refills | Status: DC

## 2020-04-15 MED ORDER — BENZTROPINE MESYLATE 0.5 MG PO TABS: 0.5000 mg | ORAL_TABLET | Freq: Every day | ORAL | 1 refills | Status: DC

## 2020-04-15 MED ORDER — QUETIAPINE FUMARATE 400 MG PO TABS: 600.0000 mg | ORAL_TABLET | Freq: Every day | ORAL | 1 refills | Status: DC

## 2020-04-15 NOTE — Progress Notes (Signed)
BH MD/PA/NP OP Progress Note  04/15/2020 5:11 PM Mary Dodson  MRN:  [id number]  Chief Complaint:  HPI: 54 year old female seen today for followup psychiatric evaluation.   She has a history of alcohol use disorder, major depression, and bipolar 1disorder.  She is currently prescribed Zoloft 100 mg in the morning, Cogentin 0.5 mg daily, quetiapine 100 mg twice daily,  quetiapine 400 mg at bedtime, doxepin 75 mg (for itching) at bedtime as needed, and gabapentin 800 mg 4 times a day. She reports that she has her doxepin and gabapentin filled at the [facility]). Today she reports increased anxiety and insomnia.   Patient was talkative during exam and have pressured speech.  She endorsed poor sleep, distractibility, elevated mood, and impulsive activity.  She denied irritability,  VAH, delusions, paranoia, financial extravagance, and sexually inappropriate behaviors.  Patient informed Clinical research associate that she was a single mother by choice and conceived via sperm donation. She also reports that she and her children estranged. She states that the children were taken away from her after she was placed in jail for sleep while driving which she notes was a side effect of Ambien.  Provider informed patient that she was aware of her past history as it was discussed in at her last visit. Patient endorsed understanding however had pressured speech and continued to discuss her history.  She notes that her children were given to her father and his wife.  She reports that she dislikes her father and is happy that he is now deceased.  She reports that she will be happy when her stepmother passes as well.  She denies homicidal ideation or suicidal ideation.  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  She also reports that politics specifically voter rights make her  anxious.  She is agreeable to increasing Seroquel at bedtime to 600 mg at bedtime to help stabilize mood and improve sleep.  She is also agreeable to starting hydroxyzine 10 mg 3 times a day to help with symptoms of anxiety.  She plans to follow-up with outpatient counselor for therapy.  No other concerns noted at this time  Visit Diagnosis: No diagnosis found.   Past Psychiatric History: alcohol use disorder, major depression, and bipolar 1disorder.     Past Medical History:  Past Medical History:  Diagnosis Date  . Alcohol abuse   . Bipolar disease, chronic (HCC)   . Hypothyroidism (acquired)   . PTSD (post-traumatic stress disorder)    No past surgical history on file.  Family Psychiatric History: Mother committed suicide in 46  Family History:  Family History  Problem Relation Age of Onset  . Cancer Mother        suicide  . Breast cancer Maternal Grandmother   . Cancer Maternal Grandmother   . Heart attack Father     Social History:  Social History   Socioeconomic History  . Marital status: Single    Spouse name: Not on file  . Number of children: 2  . Years of education: Not on file  . Highest education level: Bachelor's degree (e.g., BA, AB, BS)  Occupational History  . Occupation: unemployed  Tobacco Use  . Smoking status: Former Smoker    Packs/day: 1.00    Types: Cigarettes    Quit date: 04/30/2019    Years since quitting: 0.9  . Smokeless tobacco: Never  Used  Vaping Use  . Vaping Use: Some days  Substance and Sexual Activity  . Alcohol use: Not Currently    Comment: recovering alcoholic  . Drug use: No  . Sexual activity: Not Currently    Birth control/protection: None  Other Topics Concern  . Not on file  Social History Narrative   ** Merged History Encounter **       ** Merged History Encounter **      Patient is right-handed. She lives alone in a one level home. She drinks 5 cups of coffee a week, and 6 cans of Diet-Pepsi a day. Patient  states she uses 6-8 BC powders a day.        Social Determinants of Health   Financial Resource Strain:   . Difficulty of Paying Living Expenses:   Food Insecurity:   . Worried About Programme researcher, broadcasting/film/video in the Last Year:   . Barista in the Last Year:   Transportation Needs: No Transportation Needs  . Lack of Transportation (Medical): No  . Lack of Transportation (Non-Medical): No  Physical Activity:   . Days of Exercise per Week:   . Minutes of Exercise per Session:   Stress:   . Feeling of Stress :   Social Connections:   . Frequency of Communication with Friends and Family:   . Frequency of Social Gatherings with Friends and Family:   . Attends Religious Services:   . Active Member of Clubs or Organizations:   . Attends Banker Meetings:   Marland Kitchen Marital Status:     Allergies:  Allergies  Allergen Reactions  . Morphine And Related Other (See Comments)    "Makes me crazy"  . Morphine And Related Other (See Comments)    "It is bad"  . Morphine And Related   . Trazodone And Nefazodone Other (See Comments)    Metabolic Disorder Labs: Lab Results  Component Value Date   HGBA1C 5.4 10/03/2016   MPG 108 10/03/2016   Lab Results  Component Value Date   PROLACTIN 21.6 10/03/2016   Lab Results  Component Value Date   CHOL 208 (H) 10/03/2016   TRIG 100 04/27/2019   HDL 128 10/03/2016   CHOLHDL 1.6 10/03/2016   VLDL 9 10/03/2016   LDLCALC 71 10/03/2016   Lab Results  Component Value Date   TSH 0.210 (L) 04/21/2019   TSH 0.867 03/19/2018    Therapeutic Level Labs: No results found for: LITHIUM No results found for: VALPROATE No components found for:  CBMZ  Current Medications: Current Outpatient Medications  Medication Sig Dispense Refill  . albuterol (VENTOLIN HFA) 108 (90 Base) MCG/ACT inhaler Inhale 2 puffs into the lungs every 6 (six) hours as needed for wheezing or shortness of breath. (Patient not taking: Reported on 04/13/2020)    .  Amino Acids (AMINO ACID PO) Take 1 tablet by mouth daily.    . Aspirin-Salicylamide-Caffeine (BC HEADACHE PO) Take 6-7 packets by mouth daily as needed (headache).    . benztropine (COGENTIN) 0.5 MG tablet Take 1 tablet (0.5 mg total) by mouth at bedtime. 30 tablet 1  . calcium carbonate (OSCAL) 1500 (600 Ca) MG TABS tablet Take 600 mg of elemental calcium by mouth daily with breakfast.    . doxepin (SINEQUAN) 75 MG capsule Take 75 mg by mouth at bedtime as needed (sleep).     . gabapentin (NEURONTIN) 400 MG capsule Take 2 capsules (800 mg total) by mouth 4 (four) times  daily -  with meals and at bedtime. (Patient taking differently: Take 400 mg by mouth 4 (four) times daily -  with meals and at bedtime. ) 30 capsule 0  . HYDROcodone-acetaminophen (NORCO/VICODIN) 5-325 MG tablet Take 1 tablet by mouth every 4 (four) hours as needed for moderate pain. (Patient not taking: Reported on 04/13/2020) 16 tablet 0  . ibuprofen (ADVIL) 800 MG tablet Take 800 mg by mouth 3 (three) times daily as needed for fever or headache (pain).  (Patient not taking: Reported on 04/13/2020)    . meloxicam (MOBIC) 15 MG tablet Take 1 tablet (15 mg total) by mouth daily. (Patient not taking: Reported on 04/13/2020) 30 tablet 0  . methocarbamol (ROBAXIN) 500 MG tablet Take 1 tablet (500 mg total) by mouth 4 (four) times daily. (Patient not taking: Reported on 04/13/2020) 16 tablet 0  . Multiple Vitamins-Minerals (MULTIVITAMIN WITH MINERALS) tablet Take 1 tablet by mouth daily.    . Omega-3 Fatty Acids (FISH OIL OMEGA-3 PO) Take 1 tablet by mouth daily.    Marland Kitchen. omeprazole (PRILOSEC) 40 MG capsule Take 40 mg by mouth daily.  0  . promethazine (PHENERGAN) 25 MG tablet Take 25 mg by mouth daily as needed for nausea or vomiting.   0  . QUEtiapine (SEROQUEL) 100 MG tablet Take 1 tablet (100 mg total) by mouth 2 (two) times daily with breakfast and lunch. Also take 400 mg at bedtime 60 tablet 1  . QUEtiapine (SEROQUEL) 400 MG tablet Take 1  tablet (400 mg total) by mouth at bedtime. Also take 100 mg with breakfast and lunch 30 tablet 1  . sertraline (ZOLOFT) 100 MG tablet Take 1 tablet (100 mg total) by mouth daily after breakfast. 30 tablet 1  . SYNTHROID 50 MCG tablet Take 1 tablet (50 mcg total) by mouth daily. 30 tablet 3   No current facility-administered medications for this visit.     Musculoskeletal: Strength & Muscle Tone: within normal limits Gait & Station: normal Patient leans: N/A  Psychiatric Specialty Exam: Review of Systems  Last menstrual period 04/08/2017, unknown if currently breastfeeding.There is no height or weight on file to calculate BMI.  General Appearance: Well Groomed  Eye Contact:  Good  Speech:  Clear and Coherent and Normal Rate  Volume:  Normal  Mood:  Anxious  Affect:  Congruent  Thought Process:  Coherent, Goal Directed and Linear  Orientation:  Full (Time, Place, and Person)  Thought Content: WDL and Logical   Suicidal Thoughts:  No  Homicidal Thoughts:  No  Memory:  Immediate;   Good Recent;   Good Remote;   Good  Judgement:  Good  Insight:  Good  Psychomotor Activity:  Normal  Concentration:  Concentration: Fair and Attention Span: Fair  Recall:  Good  Fund of Knowledge: Good  Language: Good  Akathisia:  No  Handed:  Right  AIMS (if indicated): Not done  Assets:  Communication Skills Desire for Improvement Housing Leisure Time  ADL's:  Intact  Cognition: WNL  Sleep:  Fair   Screenings: AIMS     [facility]  AIMS Total Score 0    AUDIT     [facility]  Alcohol Use Disorder Identification Test Final Score (AUDIT) 25    GAD-7     [facility]  Total GAD-7 Score 11    PHQ2-9  Counselor from 03/17/2020 in [facility]  PHQ-2  Total Score 4  PHQ-9 Total Score 12       Assessment and Plan: Today patient presents with symptoms of hypomania.  She endorses impulsivity, elevated mood, irritability,  distractibility, anxiety and insomnia.  She is agreeable to increasing Seroquel 400 mg to 600 mg nightly to help stabilize her mood.  She is also agreeable to starting hydroxyzine 10 mg 3 times a day to help with symptoms of anxiety.  She will have her doxepin and gabapentin refilled at [facility] as her preferred pharmacy does not dispense the medication.   1. Major depressive disorder with single episode, in partial remission (HCC)  COntinue- QUEtiapine (SEROQUEL) 100 MG tablet; Take 1 tablet (100 mg total) by mouth 2 (two) times daily with breakfast and lunch. Also take 400 mg at bedtime  Dispense: 60 tablet; Refill: 1 Increased- QUEtiapine (SEROQUEL) 400 MG tablet; Take 1.5 tablets (600 mg total) by mouth at bedtime. Also take 100 mg with breakfast and lunch  Dispense: 45 tablet; Refill: 1 Continue- sertraline (ZOLOFT) 100 MG tablet; Take 1 tablet (100 mg total) by mouth daily after breakfast.  Dispense: 30 tablet; Refill: 1  2. Bipolar I disorder (HCC)  Continue- benztropine (COGENTIN) 0.5 MG tablet; Take 1 tablet (0.5 mg total) by mouth at bedtime.  Dispense: 30 tablet; Refill: 1 Continue- QUEtiapine (SEROQUEL) 100 MG tablet; Take 1 tablet (100 mg total) by mouth 2 (two) times daily with breakfast and lunch. Also take 400 mg at bedtime  Dispense: 60 tablet; Refill: 1 Increased- QUEtiapine (SEROQUEL) 400 MG tablet; Take 1.5 tablets (600 mg total) by mouth at bedtime. Also take 100 mg with breakfast and lunch  Dispense: 45 tablet; Refill: 1  Follow-up in 2 months Follow-up with therapy   Shanna Cisco, NP 04/15/2020, 5:11 PM

## 2020-04-19 ENCOUNTER — Ambulatory Visit (INDEPENDENT_AMBULATORY_CARE_PROVIDER_SITE_OTHER): Payer: No Payment, Other | Admitting: Clinical

## 2020-04-19 ENCOUNTER — Other Ambulatory Visit: Payer: Self-pay

## 2020-04-19 DIAGNOSIS — F324 Major depressive disorder, single episode, in partial remission: Secondary | ICD-10-CM

## 2020-04-19 NOTE — Progress Notes (Signed)
   THERAPIST PROGRESS NOTE  Session Time: 40 minutes  Participation Level: Active  Behavioral Response: CasualAlertEuthymic  Type of Therapy: Individual Therapy  Treatment Goals addressed: Coping  Interventions: CBT  Summary:  Mary Dodson is a 54 y.o. female who presents for the scheduled session in person. Client presented oriented times five, appropriately dressed, and friendly. Client denied hallucinations and delusions. Client reported she is doing well on today. 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 Client reported she is working on saving money to go visit her daughter out of state. Client discussed during the day while she is out she has noticed that her weight is affecting her mobility. 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 Client discussed with the therapist her relationship with food and the desire to start working towards losing weight. Client discussed with the therapist creating a new habit plan and habits that would be productive with her desire to lose weight.      Suicidal/Homicidal: Nowithout intent/plan  Therapist Response:  Therapist began the session by using open ended questions to ask how the client is doing. Therapist allowed time to focus on the clients thoughts and feelings. Therapist continued to work on a therapeutic relationship by using active listening and eye contact.  Therapist discussed with the client habits such as creating a grocery list and recognizing her thoughts to consider when she is eating e.g. (such as just eating because the food is present, noticing her emotions).      Plan: Return again in 3 weeks for individual therapy.  Diagnosis: Major depressive disorder with single episode, in partial remission  Neena Rhymes Ubah Radke, LCSW 04/19/2020

## 2020-04-21 ENCOUNTER — Other Ambulatory Visit (HOSPITAL_COMMUNITY): Payer: Self-pay | Admitting: Psychiatry

## 2020-05-01 ENCOUNTER — Inpatient Hospital Stay (HOSPITAL_COMMUNITY)
Admission: EM | Admit: 2020-05-01 | Discharge: 2020-05-01 | DRG: 070 | Discharge status: Against Medical Advice | Payer: Self-pay | Attending: Internal Medicine | Admitting: Internal Medicine

## 2020-05-01 ENCOUNTER — Encounter (HOSPITAL_COMMUNITY): Payer: Self-pay | Admitting: Pediatrics

## 2020-05-01 ENCOUNTER — Other Ambulatory Visit: Payer: Self-pay

## 2020-05-01 ENCOUNTER — Emergency Department (HOSPITAL_COMMUNITY): Payer: No Typology Code available for payment source

## 2020-05-01 DIAGNOSIS — Z79899 Other long term (current) drug therapy: Secondary | ICD-10-CM

## 2020-05-01 DIAGNOSIS — F4310 Post-traumatic stress disorder, unspecified: Secondary | ICD-10-CM | POA: Diagnosis present

## 2020-05-01 DIAGNOSIS — Z7982 Long term (current) use of aspirin: Secondary | ICD-10-CM

## 2020-05-01 DIAGNOSIS — Z5329 Procedure and treatment not carried out because of patient's decision for other reasons: Secondary | ICD-10-CM | POA: Diagnosis not present

## 2020-05-01 DIAGNOSIS — Z79890 Hormone replacement therapy (postmenopausal): Secondary | ICD-10-CM

## 2020-05-01 DIAGNOSIS — Z803 Family history of malignant neoplasm of breast: Secondary | ICD-10-CM

## 2020-05-01 DIAGNOSIS — F319 Bipolar disorder, unspecified: Secondary | ICD-10-CM | POA: Diagnosis present

## 2020-05-01 DIAGNOSIS — Z20822 Contact with and (suspected) exposure to covid-19: Secondary | ICD-10-CM | POA: Diagnosis present

## 2020-05-01 DIAGNOSIS — Z8249 Family history of ischemic heart disease and other diseases of the circulatory system: Secondary | ICD-10-CM

## 2020-05-01 DIAGNOSIS — G4733 Obstructive sleep apnea (adult) (pediatric): Secondary | ICD-10-CM | POA: Diagnosis present

## 2020-05-01 DIAGNOSIS — G9340 Encephalopathy, unspecified: Principal | ICD-10-CM | POA: Diagnosis present

## 2020-05-01 DIAGNOSIS — E039 Hypothyroidism, unspecified: Secondary | ICD-10-CM | POA: Diagnosis present

## 2020-05-01 DIAGNOSIS — J189 Pneumonia, unspecified organism: Secondary | ICD-10-CM | POA: Diagnosis present

## 2020-05-01 DIAGNOSIS — Z87891 Personal history of nicotine dependence: Secondary | ICD-10-CM

## 2020-05-01 DIAGNOSIS — F1010 Alcohol abuse, uncomplicated: Secondary | ICD-10-CM | POA: Diagnosis present

## 2020-05-01 LAB — COMPREHENSIVE METABOLIC PANEL
ALT: 16 U/L (ref 0–44)
AST: 23 U/L (ref 15–41)
Albumin: 3.4 g/dL — ABNORMAL LOW (ref 3.5–5.0)
Alkaline Phosphatase: 80 U/L (ref 38–126)
Anion gap: 10 (ref 5–15)
BUN: 13 mg/dL (ref 6–20)
CO2: 21 mmol/L — ABNORMAL LOW (ref 22–32)
Calcium: 8.5 mg/dL — ABNORMAL LOW (ref 8.9–10.3)
Chloride: 112 mmol/L — ABNORMAL HIGH (ref 98–111)
Creatinine, Ser: 1.06 mg/dL — ABNORMAL HIGH (ref 0.44–1.00)
GFR calc Af Amer: >60 mL/min (ref >60)
GFR calc non Af Amer: 60 mL/min — ABNORMAL LOW (ref >60)
Glucose, Bld: 110 mg/dL — ABNORMAL HIGH (ref 70–99)
Potassium: 3.5 mmol/L (ref 3.5–5.1)
Sodium: 143 mmol/L (ref 135–145)
Total Bilirubin: 0.5 mg/dL (ref 0.3–1.2)
Total Protein: 6.2 g/dL — ABNORMAL LOW (ref 6.5–8.1)

## 2020-05-01 LAB — PROTIME-INR
INR: 1.2 (ref 0.8–1.2)
Prothrombin Time: 14.8 seconds (ref 11.4–15.2)

## 2020-05-01 LAB — HIV ANTIBODY (ROUTINE TESTING W REFLEX): HIV Screen 4th Generation wRfx: NONREACTIVE

## 2020-05-01 LAB — I-STAT BETA HCG BLOOD, ED (MC, WL, AP ONLY): I-stat hCG, quantitative: <5 m[IU]/mL (ref <5)

## 2020-05-01 LAB — TSH: TSH: 0.175 u[IU]/mL — ABNORMAL LOW (ref 0.350–4.500)

## 2020-05-01 LAB — ACETAMINOPHEN LEVEL: Acetaminophen (Tylenol), Serum: <10 ug/mL — ABNORMAL LOW (ref 10–30)

## 2020-05-01 LAB — I-STAT CHEM 8, ED
BUN: 15 mg/dL (ref 6–20)
Calcium, Ion: 0.98 mmol/L — ABNORMAL LOW (ref 1.15–1.40)
Chloride: 112 mmol/L — ABNORMAL HIGH (ref 98–111)
Creatinine, Ser: 1 mg/dL (ref 0.44–1.00)
Glucose, Bld: 105 mg/dL — ABNORMAL HIGH (ref 70–99)
HCT: 33 % — ABNORMAL LOW (ref 36.0–46.0)
Hemoglobin: 11.2 g/dL — ABNORMAL LOW (ref 12.0–15.0)
Potassium: 3.8 mmol/L (ref 3.5–5.1)
Sodium: 144 mmol/L (ref 135–145)
TCO2: 23 mmol/L (ref 22–32)

## 2020-05-01 LAB — CBC
HCT: 32.8 % — ABNORMAL LOW (ref 36.0–46.0)
Hemoglobin: 9.6 g/dL — ABNORMAL LOW (ref 12.0–15.0)
MCH: 23.4 pg — ABNORMAL LOW (ref 26.0–34.0)
MCHC: 29.3 g/dL — ABNORMAL LOW (ref 30.0–36.0)
MCV: 80 fL (ref 80.0–100.0)
Platelets: 375 10*3/uL (ref 150–400)
RBC: 4.1 MIL/uL (ref 3.87–5.11)
RDW: 18.5 % — ABNORMAL HIGH (ref 11.5–15.5)
WBC: 14.9 10*3/uL — ABNORMAL HIGH (ref 4.0–10.5)
nRBC: 0 % (ref 0.0–0.2)

## 2020-05-01 LAB — SARS CORONAVIRUS 2 BY RT PCR (HOSPITAL ORDER, PERFORMED IN ~~LOC~~ HOSPITAL LAB): SARS Coronavirus 2: NEGATIVE

## 2020-05-01 LAB — DIFFERENTIAL
Abs Immature Granulocytes: 0.05 10*3/uL (ref 0.00–0.07)
Basophils Absolute: 0.1 10*3/uL (ref 0.0–0.1)
Basophils Relative: 1 %
Eosinophils Absolute: 0.1 10*3/uL (ref 0.0–0.5)
Eosinophils Relative: 0 %
Immature Granulocytes: 0 %
Lymphocytes Relative: 4 %
Lymphs Abs: 0.6 10*3/uL — ABNORMAL LOW (ref 0.7–4.0)
Monocytes Absolute: 1 10*3/uL (ref 0.1–1.0)
Monocytes Relative: 7 %
Neutro Abs: 13.2 10*3/uL — ABNORMAL HIGH (ref 1.7–7.7)
Neutrophils Relative %: 88 %

## 2020-05-01 LAB — LACTIC ACID, PLASMA: Lactic Acid, Venous: 1.1 mmol/L (ref 0.5–1.9)

## 2020-05-01 LAB — SALICYLATE LEVEL: Salicylate Lvl: 25.8 mg/dL (ref 7.0–30.0)

## 2020-05-01 LAB — AMMONIA: Ammonia: 25 umol/L (ref 9–35)

## 2020-05-01 LAB — PROCALCITONIN: Procalcitonin: 1.41 ng/mL

## 2020-05-01 LAB — ETHANOL: Alcohol, Ethyl (B): <10 mg/dL (ref <10)

## 2020-05-01 LAB — APTT: aPTT: 30 seconds (ref 24–36)

## 2020-05-01 MED ORDER — SODIUM CHLORIDE 0.9 % IV BOLUS
1000.0000 mL | Freq: Once | INTRAVENOUS | Status: AC
  Administered 2020-05-01: 1000 mL via INTRAVENOUS

## 2020-05-01 MED ORDER — SODIUM CHLORIDE 0.9 % IV SOLN: INTRAVENOUS | Status: DC

## 2020-05-01 MED ORDER — ACETAMINOPHEN 325 MG PO TABS: 650.0000 mg | ORAL_TABLET | Freq: Four times a day (QID) | ORAL | Status: DC | PRN

## 2020-05-01 MED ORDER — HALOPERIDOL LACTATE 5 MG/ML IJ SOLN
2.5000 mg | Freq: Once | INTRAMUSCULAR | Status: AC
  Administered 2020-05-01: 2.5 mg via INTRAMUSCULAR
  Filled 2020-05-01: qty 1

## 2020-05-01 MED ORDER — ENOXAPARIN SODIUM 40 MG/0.4ML ~~LOC~~ SOLN: 40.0000 mg | SUBCUTANEOUS | Status: DC

## 2020-05-01 MED ORDER — HALOPERIDOL LACTATE 5 MG/ML IJ SOLN: 2.5000 mg | Freq: Once | INTRAMUSCULAR | Status: DC

## 2020-05-01 MED ORDER — LORAZEPAM 2 MG/ML IJ SOLN: 1.0000 mg | Freq: Once | INTRAMUSCULAR | Status: DC

## 2020-05-01 MED ORDER — SODIUM CHLORIDE 0.9% FLUSH: 3.0000 mL | Freq: Once | INTRAVENOUS | Status: DC

## 2020-05-01 MED ORDER — AZITHROMYCIN 250 MG PO TABS: 500.0000 mg | ORAL_TABLET | Freq: Once | ORAL | Status: DC

## 2020-05-01 MED ORDER — ACETAMINOPHEN 650 MG RE SUPP: 650.0000 mg | Freq: Four times a day (QID) | RECTAL | Status: DC | PRN

## 2020-05-01 MED ORDER — LORAZEPAM 2 MG/ML IJ SOLN: 1.0000 mg | Freq: Once | INTRAMUSCULAR | Status: DC | PRN

## 2020-05-01 MED ORDER — SODIUM CHLORIDE 0.9 % IV SOLN
1.0000 g | Freq: Once | INTRAVENOUS | Status: AC
  Administered 2020-05-01: 1 g via INTRAVENOUS
  Filled 2020-05-01: qty 10

## 2020-05-01 NOTE — ED Notes (Signed)
Patient sleeping, noted Sp02 at 88-89%; when aroused Sp02 normalized, patient placed on 2L via [place]

## 2020-05-01 NOTE — H&P (Signed)
Date: 05/01/2020               Patient Name:  Mary Dodson MRN: [id number]  DOB: 06/12/1966 Age / Sex: 54 y.o., female   PCP: Hilbert Corrigan Chales Abrahams, NP         Medical Service: Internal Medicine Teaching Service         Attending Physician: Dr. Inez Catalina, MD    First Contact: Dr. Arna Snipe Pager: [phone]  Second Contact: Dr. Hermine Messick Pager: [phone]       After Hours (After 5p/  First Contact Pager: [phone]  weekends / holidays): Second Contact Pager: [phone]   Chief Complaint: I'm sick  History of Present Illness: Patient reports that "she's sick". She was intermittently falling asleep and not answering questions. She reports that she doesn't know why she was brought in. States she doesn't know when asked to tell us her name. Attempted to contact friend listed in chart x 2 but both numbers were not answered.   History obtained from chart. Per ED: Patient states that she has episodes where she feels like she is drunk and high, but denies any recent alcohol use.  Per Epic, last admission for similar sx 04/2019.  That hospitalization with negative EEG, MRI/MRA brain, negative LP.  There is a note from [facility] neuro 09/2019 where she was evaluated in clinic for these episodes.  She presented as a code stroke today.  She denies any pain or recent illness.  Reportedly woke up with symptoms this morning.  History of heavy salicylate use, continues to take [facility] powder routinely.   Per neurology: Patient went to bed 1am this morning and woke up 11am garbled speech, difficulty with speaking, lack of motor coordination, not even able to hold cup and very confused. No unilateral weakness seen. En Route pt speech much improved, still a little slurry but able to state that she woke up and tried to stand up but not able. However, she still very distractable and confused.     Meds:  No outpatient medications have been marked as taking for the 05/01/20 encounter [facility] Encounter).      Allergies: Allergies as of 05/01/2020 - Review Complete 05/01/2020  Allergen Reaction Noted   Morphine and related Other (See Comments) 10/01/2016   Morphine and related Other (See Comments) 09/20/2016   Morphine and related  09/22/2016   Trazodone and nefazodone Other (See Comments) 10/05/2016   Past Medical History:  Diagnosis Date   Alcohol abuse    Bipolar disease, chronic (HCC)    Hypothyroidism (acquired)    PTSD (post-traumatic stress disorder)     Family History: Per chart history Mother- deceased cancer suicide Father- heart attack Maternal Grandmother- heart attack, breast cancer  Social History: Per chart history Former smoker 1 ppd quit one year ago. Reports form EtOH use. Reports no drug use.  Review of Systems: A complete ROS was negative except as per HPI.   Physical Exam: Blood pressure (!) 107/57, pulse 83, temperature 99.5 F (37.5 C), temperature source Rectal, resp. rate 12, height 5\' 6"  (1.676 m), weight 79.7 kg, last menstrual period 04/08/2017, SpO2 99 %, unknown if currently breastfeeding.  Physical Exam Vitals and nursing note reviewed.  Constitutional:      General: She is not in acute distress. HENT:     Head: Normocephalic and atraumatic.  Cardiovascular:     Rate and Rhythm: Normal rate and regular rhythm.     Pulses: Normal pulses.  Radial pulses are 2+ on the right side and 2+ on the left side.       Dorsalis pedis pulses are 2+ on the right side and 2+ on the left side.     Heart sounds: Normal heart sounds, S1 normal and S2 normal. No murmur heard.   Pulmonary:     Effort: Pulmonary effort is normal. No respiratory distress.     Breath sounds: Wheezing present.  Chest:     Chest wall: No tenderness.  Abdominal:     General: There is distension.     Palpations: There is no mass.     Tenderness: There is no abdominal tenderness.  Musculoskeletal:     Right lower leg: 1+ Edema present.     Left lower leg: 1+  Edema present.     Comments: Left leg is swollen and warmer than right. There are  Abrasions on the Left patella.  Neurological:     Mental Status: She is disoriented.  Psychiatric:        Mood and Affect: Affect is angry.        Speech: Speech is slurred.        Behavior: Behavior is agitated.      EKG: personally reviewed my interpretation is normal sinus rhythm  CXR: personally reviewed my interpretation is bronchitic changes with atelectasis versus infiltrate at RIGHT base.  Assessment & Plan by Problem: Active Problems:   Acute encephalopathy   Mary Dodson is a 54 yr old female with PMHx of Bipolar, PTSD, EtOH abuse, Hypothyroidism (aquired), who presents with AMS and slurred speech.    Acute Encephalopathy: Went to sleep at 1 Am and woke up at 11 AM with garbled speech, difficulty with speaking, lack of motor coordination, not even able to hold cup and very confused. No unilateral weakness seen. Speech improved some but still slurred at presentation to ER. Per chart on 7/8 Quetiapine was increased by 200 mg at night and hydroxyzine 10 mg TID started. Without knowledge of medication use history overdose cannot be ruled out, there is no test for antipsychotic levels. Suspicion for pneumonia due to chest x ray and difficulty breathing. Procalcitonin ordered. Urine drug screen and urinalysis ordered. CT head showed- Artifact versus small infarct in the right frontal operculum and no hemorrhage.  -start IV fluids 150 ml/hr - given one dose of azithromycin 500 mg oral and ceftriaxone 1 g IV -await drug screen -await procalcitonin   Pneumonia: With chest X findings- bronchitic changes with atelectasis versus infiltrate at RIGHT base. Antibiotics given. Labs drawn. -start IV fluids 150 ml/hr - given one dose of azithromycin 500 mg oral and ceftriaxone 1 g IV -await procalcitonin   Hypothyroidism: Acquired -Continue synthroid 50 mcg daily   Fluids: 150 ml/hr Diet: NPO DVT:  enoxaparin   Dispo: Admit patient to Inpatient with expected length of stay greater than 2 midnights.  Signed: Lauro Franklin, MD 05/01/2020, 4:44 PM  Pager: [phone] After 5pm on weekdays and 1pm on weekends: On Call pager: [phone]

## 2020-05-01 NOTE — Progress Notes (Signed)
At 6:00 we were called to patients room. Upon entering the room the patient was arguing with the nurse about wanting to walk to the bathroom. The nurse and [employer] officer were explaining to the patient that getting up to walk to the bathroom would be dangerous because she could fall due to her history of these episodes causing weakness in her legs and her falling. The nurse stated that she could place a [facility] cath on her again as she had done multiple times already this evening. The patient refused this saying that she wanted to walk to the bathroom or get up out of the bed to a bedside toilet. She refused the [facility] again and refused a bedpan. The nurse stated that she had been in the room since 3:30 discussing this with the patient. The [employer] officer stated that she had been at the doorway for around 20 minutes at this point and that the patient had changed significantly since she had been there.  We then stated our concern for her injuring herself if she were to get out the bed due to her weakness. We asked her what would happen if she were to get out of bed and fall. She stated she did not know but knew she had to use the bathroom. At this point she became more combative and said that if we did not let her go to the bathroom she would call her friend to come get her and she would leave. We then asked her where she was. She stated she did not know but that she would find out and her friend would come and get her. We then asked her what year it was to which she replied it was "the got to pee year." She then started to ripe her IV out and ripped her EKG leads off and her gown.  At this point she attempted to get out of the bed. We stated that she could hurt herself and that she needed to leave, that we could not keep her here. At this point we stated that if she got out of the bed she most likely would fall and hurt herself. She became very agitated and began kicking out to attempt to get of the bed. At this point we  called for extra help and decided that she was unable to make safe decisions for herself and that if she were to leave she would hurt herself. At this point to ensure she would not get off the bed and fall we had her placed in 4 point restraints and asked for IVC paperwork. We will consult Psych to evaluate her further.  Discussed with EDP to discuss IVC paperwork. After discussion about patients status and current state. Dr. Juleen China did not feel that an IVC was warranted based on current status. Recommended sedation and restraints. Restraints already ordered. Dr. Juleen China placed IM haldol and IM ativan orders. Psychiatry consulted.

## 2020-05-01 NOTE — ED Notes (Signed)
 bladder scan

## 2020-05-01 NOTE — ED Notes (Addendum)
Pt refuses to stay in hospital.  Pt called friend to come get her.  AMA signed by pt and witnessed by this RN.  Pt taken by wheelchair to lobby to wait on friend

## 2020-05-01 NOTE — Consult Note (Addendum)
Stroke Neurology Consultation Note  Consult Requested by: Dr. Rubin Payor  Reason for Consult: code stroke  Consult Date: 05/01/20  The history was obtained from the EMS and pt.  During history and examination, all items were able to obtain unless otherwise noted.  History of Present Illness:  Mary Dodson is a 54 y.o. Caucasian female with PMH of bipolar, depression, OSA, episodes of encephalopathy presented to ER for slurry speech, confusion and incoordination.   Pt is not good historian, decreased attention and concentration and very distractable. Difficulty to get hx from her, however, per EDP, pt went to bed 1am this morning and woke up 11am garbled speech, difficulty with speaking, lack of motor coordination, not even able to hold cup and very confused. No unilateral weakness seen. En Route pt speech much improved, still a little slurry but able to state that she woke up and tried to stand up but not able. However, she still very distractable and confused.   As per note, pt was here in one year ago for agitation and delirum. MRI brain negative for acute findings.  LP negative for infection.  EEG negative for epileptiform activity. Suspected acute toxic encephalopathy at that time, possibly from alcohol withdrawal, medications versus behavioral disturbance from underlying psychiatric disorder. As per EMS, pt also treated in [facility] in the past for similar episode.  LSN: 1am this am tPA Given: No: outside window, likely non stroke  Past Medical History:  Diagnosis Date  . Alcohol abuse   . Bipolar disease, chronic (HCC)   . Hypothyroidism (acquired)   . PTSD (post-traumatic stress disorder)     No past surgical history on file.  Family History  Problem Relation Age of Onset  . Cancer Mother        suicide  . Breast cancer Maternal Grandmother   . Cancer Maternal Grandmother   . Heart attack Father     Social History:  reports that she quit smoking about a year ago. Her smoking  use included cigarettes. She smoked 1.00 pack per day. She has never used smokeless tobacco. She reports previous alcohol use. She reports that she does not use drugs.  Allergies:  Allergies  Allergen Reactions  . Morphine And Related Other (See Comments)    "Makes me crazy"  . Morphine And Related Other (See Comments)    "It is bad"  . Morphine And Related   . Trazodone And Nefazodone Other (See Comments)    No current facility-administered medications on file prior to encounter.   Current Outpatient Medications on File Prior to Encounter  Medication Sig Dispense Refill  . albuterol (VENTOLIN HFA) 108 (90 Base) MCG/ACT inhaler Inhale 2 puffs into the lungs every 6 (six) hours as needed for wheezing or shortness of breath. (Patient not taking: Reported on 04/13/2020)    . Amino Acids (AMINO ACID PO) Take 1 tablet by mouth daily.    . Aspirin-Salicylamide-Caffeine (BC HEADACHE PO) Take 6-7 packets by mouth daily as needed (headache).    . benztropine (COGENTIN) 0.5 MG tablet Take 1 tablet (0.5 mg total) by mouth at bedtime. 30 tablet 1  . calcium carbonate (OSCAL) 1500 (600 Ca) MG TABS tablet Take 600 mg of elemental calcium by mouth daily with breakfast.    . doxepin (SINEQUAN) 75 MG capsule Take 75 mg by mouth at bedtime as needed (sleep).     . gabapentin (NEURONTIN) 400 MG capsule Take 2 capsules (800 mg total) by mouth 4 (four) times daily -  with  meals and at bedtime. (Patient taking differently: Take 400 mg by mouth 4 (four) times daily -  with meals and at bedtime. ) 30 capsule 0  . HYDROcodone-acetaminophen (NORCO/VICODIN) 5-325 MG tablet Take 1 tablet by mouth every 4 (four) hours as needed for moderate pain. (Patient not taking: Reported on 04/13/2020) 16 tablet 0  . hydrOXYzine (ATARAX/VISTARIL) 10 MG tablet Take 1 tablet (10 mg total) by mouth 3 (three) times daily as needed. 90 tablet 1  . ibuprofen (ADVIL) 800 MG tablet Take 800 mg by mouth 3 (three) times daily as needed for  fever or headache (pain).  (Patient not taking: Reported on 04/13/2020)    . meloxicam (MOBIC) 15 MG tablet Take 1 tablet (15 mg total) by mouth daily. (Patient not taking: Reported on 04/13/2020) 30 tablet 0  . methocarbamol (ROBAXIN) 500 MG tablet Take 1 tablet (500 mg total) by mouth 4 (four) times daily. (Patient not taking: Reported on 04/13/2020) 16 tablet 0  . Multiple Vitamins-Minerals (MULTIVITAMIN WITH MINERALS) tablet Take 1 tablet by mouth daily.    . Omega-3 Fatty Acids (FISH OIL OMEGA-3 PO) Take 1 tablet by mouth daily.    Marland Kitchen omeprazole (PRILOSEC) 40 MG capsule Take 40 mg by mouth daily.  0  . promethazine (PHENERGAN) 25 MG tablet Take 25 mg by mouth daily as needed for nausea or vomiting.   0  . QUEtiapine (SEROQUEL) 100 MG tablet Take 1 tablet (100 mg total) by mouth 2 (two) times daily with breakfast and lunch. Also take 400 mg at bedtime 60 tablet 1  . QUEtiapine (SEROQUEL) 400 MG tablet Take 1.5 tablets (600 mg total) by mouth at bedtime. Also take 100 mg with breakfast and lunch 45 tablet 1  . sertraline (ZOLOFT) 100 MG tablet Take 1 tablet (100 mg total) by mouth daily after breakfast. 30 tablet 1  . SYNTHROID 50 MCG tablet Take 1 tablet (50 mcg total) by mouth daily. 30 tablet 3    Review of Systems: A full ROS was attempted today and was not able to be performed due to confusion  Physical Examination:    General - well nourished, well developed, in no apparent distress, but mildly agitated.    Ophthalmologic - fundi not visualized due to noncooperation.    Cardiovascular - regular rhythm and rate  Neuro - mildly drowsy, but arouse with mild stimulation, eyes open on voice, orientated to self and age but not to time. Did not answer orientation questions for place. Decreased attention and concentration, very distractable. Talkative, with mild dysarthria but no aphasia. Able to follow simple commands. Naming 2/2 and able to repeat. Visual field full, no gaze palsy, facial  symmetrical, tongue midline. Moving all extremities symmetrically, possible asterixis BUEs. Sensation symmetric subjectively, FTN bilaterally grossly intact. Gait not tested.   NIH Stroke Scale  Level Of Consciousness 0=Alert; keenly responsive 1=Arouse to minor stimulation 2=Requires repeated stimulation to arouse or movements to pain 3=postures or unresponsive 1  LOC Questions to Month and Age 8=Answers both questions correctly 1=Answers one question correctly or dysarthria/intubated/trauma/language barrier 2=Answers neither question correctly or aphasia 1  LOC Commands      -Open/Close eyes     -Open/close grip     -Pantomime commands if communication barrier 0=Performs both tasks correctly 1=Performs one task correctly 2=Performs neighter task correctly 0  Best Gaze     -Only assess horizontal gaze 0=Normal 1=Partial gaze palsy 2=Forced deviation, or total gaze paresis 0  Visual 0=No visual loss 1=Partial hemianopia  2=Complete hemianopia 3=Bilateral hemianopia (blind including cortical blindness) 0  Facial Palsy     -Use grimace if obtunded 0=Normal symmetrical movement 1=Minor paralysis (asymmetry) 2=Partial paralysis (lower face) 3=Complete paralysis (upper and lower face) 0  Motor  0=No drift for 10/5 seconds 1=Drift, but does not hit bed 2=Some antigravity effort, hits  bed 3=No effort against gravity, limb falls 4=No movement 0=Amputation/joint fusion Right Arm 0     Leg 1    Left Arm 0     Leg 1  Limb Ataxia     - FNT/HTS 0=Absent or does not understand or paralyzed or amputation/joint fusion 1=Present in one limb 2=Present in two limbs 0  Sensory 0=Normal 1=Mild to moderate sensory loss 2=Severe to total sensory loss or coma/unresponsive 0  Best Language 0=No aphasia, normal 1=Mild to moderate aphasia 2=Severe aphasia 3=Mute, global aphasia, or coma/unresponsive 0  Dysarthria 0=Normal 1=Mild to moderate 2=Severe, unintelligible or  mute/anarthric 0=intubated/unable to test 1  Extinction/Neglect 0=No abnormality 1=visual/tactile/auditory/spatia/personal inattention/Extinction to bilateral simultaneous stimulation 2=Profound neglect/extinction more than 1 modality  0  Total   5     Data Reviewed: MS DIGITAL SCREENING TOMO BILATERAL  Result Date: 04/15/2020 CLINICAL DATA:  Screening. EXAM: DIGITAL SCREENING BILATERAL MAMMOGRAM WITH TOMO AND CAD COMPARISON:  Previous exam(s). ACR Breast Density Category b: There are scattered areas of fibroglandular density. FINDINGS: There are no findings suspicious for malignancy. Images were processed with CAD. IMPRESSION: No mammographic evidence of malignancy. A result letter of this screening mammogram will be mailed directly to the patient. RECOMMENDATION: Screening mammogram in one year. (Code:SM-B-01Y) BI-RADS CATEGORY  1: Negative. Electronically Signed   By: Gerome Sam III M.D   On: 04/15/2020 12:31    Assessment: 54 y.o. female with PMH of bipolar, depression, OSA, episodes of encephalopathy presented to ER for slurry speech, confusion and incoordination. Symptoms improved with EMS. Currently no focal deficit but NIHSS = 5 with mild confusion and dysarthria.  Pt was here in one year ago for agitation and delirum. MRI brain negative for acute findings.  LP negative for infection.  EEG negative for epileptiform activity. Suspected acute toxic encephalopathy at that time. CT no acute finding, formal result pending. Pt not tPA candidate given outside window and likely non stroke without focal deficit. Concerning for encephalopathy at this time. But recommend MRI to rule out stroke.   Stroke Risk Factors - hypertension  Plan: - MRI of the brain with and without contrast to rule out stroke and for encephalopathy work up - if positive for stroke, she needs stroke work up  - if not stroke, she needs to continue encephalopathy work up - will order routine EEG also - recommend ASA at  meantime before MRI  - Risk factor modification - Telemetry monitoring - Frequent neuro checks  Thank you for this consultation and allowing [place] to participate in the care of this patient.  Marvel Plan, MD PhD Stroke Neurology 05/01/2020 12:50 PM

## 2020-05-01 NOTE — Hospital Course (Signed)
Admitted 05/01/2020   Allergies: Morphine and related, Morphine and related, Morphine and related, and Trazodone and nefazodone Pertinent Hx: Bipolar disease, EtOH use, hypothyroidism  54 y.o. female p/w acute encephalopathy  * Acute encephalopathy: Woke this morning with slurred speech and confusion since this morning, intermittent confusion. CXR-?atelectasis. CT head showed possible infarct in right frontal operculum, no hemorrhage. Afebrile. WBC 14. Given azithro and ceftriaxone. Blood cultures obtained. Neuro evaluated, rec MRI and EEG however thinking more encephalopathy.   *Hypothyroidism: On synthroid 50 mcg daily, TSH 0.175. Continue home dose for now.   *Bipolar: On cogentin, doxepin, seroquel, and sertraline at home.   Consults: Neurology  Meds: Azithro, ceftriaxone VTE ppx: Lovenox IVF: NS Diet: NPO

## 2020-05-01 NOTE — ED Notes (Signed)
Pt very agitated, cursing, swinging arms, uncooperative.

## 2020-05-01 NOTE — ED Notes (Signed)
Annice Pih, [place], [phone] would like an update when available

## 2020-05-01 NOTE — Progress Notes (Signed)
Patient was not available for EEG at the moment per nursing staff.. Will try back

## 2020-05-01 NOTE — ED Triage Notes (Signed)
Patient arrived to unit as Code stroke d/t slurred speech and weakness. EMS endorsed [facility] was when patient went to bed at 0100 and everything was normal.

## 2020-05-01 NOTE — Progress Notes (Signed)
Pt is now available for EEG but highstrung will attempt EEG.

## 2020-05-01 NOTE — Progress Notes (Signed)
Patient isnt cooperative for eeg, pulled off  Leads during set up tried to exit the bed.  Will try tomorrow if available and if patient is cooperative.

## 2020-05-01 NOTE — ED Notes (Signed)
Pt unable to go to MRI at this time due to being uncooperative

## 2020-05-01 NOTE — ED Provider Notes (Signed)
[facility] Provider Note   CSN: [id number] Arrival date & time: 05/01/20  1208  An emergency department physician performed an initial assessment on this suspected stroke patient at 1205.  History Chief Complaint  Patient presents with  . Code Stroke    Mary CharterCheryl Dodson is a 54 y.o. female.  Patient with history of bipolar disease, alcohol abuse, hypothyroidism, acute encephalopathy --presents to the emergency department today for confusion and slurred speech.  Level 5 caveat due to altered mental status.  Patient states that she has episodes where she feels like she is drunk and high, but denies any recent alcohol use.  Per Epic, last admission for similar sx 04/2019.  That hospitalization with negative EEG, MRI/MRA brain, negative LP.  There is a note from [facility] neuro 09/2019 where she was evaluated in clinic for these episodes.  She presented as a code stroke today.  She denies any pain or recent illness.  Reportedly woke up with symptoms this morning.  History of heavy salicylate use, continues to take [facility] powder routinely.        Past Medical History:  Diagnosis Date  . Alcohol abuse   . Bipolar disease, chronic (HCC)   . Hypothyroidism (acquired)   . PTSD (post-traumatic stress disorder)     Patient Active Problem List   Diagnosis Date Noted  . Bipolar I disorder, most recent episode (or current) manic (HCC) 03/19/2020  . Alcohol use disorder, severe, in sustained remission (HCC) 03/17/2020  . Altered mental status   . Acute on chronic respiratory failure with hypoxia (HCC) 04/22/2019  . 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 Alcohol abuse   . Bipolar disease, chronic (HCC)   . Hypothyroidism (acquired)   . ASCUS of cervix with negative high risk HPV 03/19/2018  . Helicobacter pylori gastritis 03/19/2018  . Hypothyroidism 03/19/2018  . Bipolar disorder, current episode manic severe with psychotic features (HCC) 10/06/2016  . Alcohol use  disorder, severe, dependence (HCC) 10/06/2016  . Alcohol use disorder, severe, dependence (HCC) 09/26/2016  . Major depressive disorder with single episode, in partial remission (HCC) 09/26/2016  . Acute encephalopathy 09/20/2016  . Airway intubation performed without difficulty   . Hypothermia   . Alcoholic intoxication with complication (HCC)   . COPD with acute exacerbation (HCC)     History reviewed. No pertinent surgical history.   OB History    Gravida  4   Para  0   Term  0   Preterm  0   AB  1   Living        SAB  1   TAB  0   Ectopic  0   Multiple      Live Births  2           Family History  Problem Relation Age of Onset  . Cancer Mother        suicide  . Breast cancer Maternal Grandmother   . Cancer Maternal Grandmother   . Heart attack Father     Social History   Tobacco Use  . Smoking status: Former Smoker    Packs/day: 1.00    Types: Cigarettes    Quit date: 04/30/2019    Years since quitting: 1.0  . Smokeless tobacco: Never Used  Vaping Use  . Vaping Use: Some days  Substance Use Topics  . Alcohol use: Not Currently    Comment: recovering alcoholic  . Drug use: No    Home Medications Prior to Admission  medications   Medication Sig Start Date End Date Taking? Authorizing Provider  albuterol (VENTOLIN HFA) 108 (90 Base) MCG/ACT inhaler Inhale 2 puffs into the lungs every 6 (six) hours as needed for wheezing or shortness of breath. Patient not taking: Reported on 04/13/2020    [provider]  Amino Acids (AMINO ACID PO) Take 1 tablet by mouth daily.    [provider]  Aspirin-Salicylamide-Caffeine (BC HEADACHE PO) Take 6-7 packets by mouth daily as needed (headache).    [provider]  benztropine (COGENTIN) 0.5 MG tablet Take 1 tablet (0.5 mg total) by mouth at bedtime. 04/15/20   Shanna Cisco, NP  calcium carbonate (OSCAL) 1500 (600 Ca) MG TABS tablet Take 600 mg of elemental calcium by mouth  daily with breakfast.    [provider]  doxepin (SINEQUAN) 75 MG capsule Take 75 mg by mouth at bedtime as needed (sleep).  04/07/19   [provider]  gabapentin (NEURONTIN) 400 MG capsule Take 2 capsules (800 mg total) by mouth 4 (four) times daily -  with meals and at bedtime. Patient taking differently: Take 400 mg by mouth 4 (four) times daily -  with meals and at bedtime.  10/06/16   Oneta Rack, NP  HYDROcodone-acetaminophen (NORCO/VICODIN) 5-325 MG tablet Take 1 tablet by mouth every 4 (four) hours as needed for moderate pain. Patient not taking: Reported on 04/13/2020 01/27/20   Cuthriell, Delorise Royals, PA-C  hydrOXYzine (ATARAX/VISTARIL) 10 MG tablet Take 1 tablet (10 mg total) by mouth 3 (three) times daily as needed. 04/15/20   Shanna Cisco, NP  ibuprofen (ADVIL) 800 MG tablet Take 800 mg by mouth 3 (three) times daily as needed for fever or headache (pain).  Patient not taking: Reported on 04/13/2020 04/17/19   [provider]  meloxicam (MOBIC) 15 MG tablet Take 1 tablet (15 mg total) by mouth daily. Patient not taking: Reported on 04/13/2020 03/07/20   Michela Pitcher A, PA-C  methocarbamol (ROBAXIN) 500 MG tablet Take 1 tablet (500 mg total) by mouth 4 (four) times daily. Patient not taking: Reported on 04/13/2020 01/27/20   Cuthriell, Delorise Royals, PA-C  Multiple Vitamins-Minerals (MULTIVITAMIN WITH MINERALS) tablet Take 1 tablet by mouth daily.    [provider]  Omega-3 Fatty Acids (FISH OIL OMEGA-3 PO) Take 1 tablet by mouth daily.    [provider]  omeprazole (PRILOSEC) 40 MG capsule Take 40 mg by mouth daily. 05/27/18   [provider]  promethazine (PHENERGAN) 25 MG tablet Take 25 mg by mouth daily as needed for nausea or vomiting.  05/27/18   [provider]  QUEtiapine (SEROQUEL) 100 MG tablet Take 1 tablet (100 mg total) by mouth 2 (two) times daily with breakfast and lunch. Also take 400 mg at bedtime 04/15/20   Toy Cookey E, NP  QUEtiapine (SEROQUEL) 400 MG tablet Take 1.5 tablets (600 mg total) by mouth at bedtime. Also take 100 mg with breakfast and lunch 04/15/20   Toy Cookey E, NP  sertraline (ZOLOFT) 100 MG tablet Take 1 tablet (100 mg total) by mouth daily after breakfast. 04/15/20   Shanna Cisco, NP  SYNTHROID 50 MCG tablet Take 1 tablet (50 mcg total) by mouth daily. 03/20/18   Hoy Register, MD    Allergies    Morphine and related, Morphine and related, Morphine and related, and Trazodone and nefazodone  Review of Systems   Review of Systems  Unable to perform ROS: Mental status change  Physical Exam Updated Vital Signs BP (!) 110/53 (BP Location: Left Arm)   Pulse 87   Temp 98.1 F (36.7 C) (Oral)   Resp 18   Ht 5\' 6"  (1.676 m)   Wt 79.7 kg   LMP 04/08/2017 (Exact Date)   SpO2 100%   BMI 28.36 kg/m   Physical Exam Vitals and nursing note reviewed.  Constitutional:      Appearance: She is well-developed.  HENT:     Head: Normocephalic and atraumatic.     Nose: Nose normal.     Mouth/Throat:     Mouth: Mucous membranes are moist.  Eyes:     General:        Right eye: No discharge.        Left eye: No discharge.     Conjunctiva/sclera: Conjunctivae normal.  Cardiovascular:     Rate and Rhythm: Normal rate and regular rhythm.     Heart sounds: Normal heart sounds.     Comments: Occasional wet sounding cough during exam Pulmonary:     Effort: Pulmonary effort is normal.     Breath sounds: Rhonchi (mild, scattered) present.  Abdominal:     Palpations: Abdomen is soft.     Tenderness: There is no abdominal tenderness. There is no guarding or rebound.  Musculoskeletal:     Cervical back: Normal range of motion and neck supple.  Skin:    General: Skin is warm and dry.  Neurological:     Mental Status: She is alert.     Comments: Patient is communicative.  Her speech is slurred.  No facial droop.  She is oriented to person and date.  She is not oriented  to place.  She moves all extremities purposefully.  She can hold her arms up in the air against gravity.     ED Results / Procedures / Treatments   Labs (all labs ordered are listed, but only abnormal results are displayed) Labs Reviewed  CBC - Abnormal; Notable for the following components:      Result Value   WBC 14.9 (*)    Hemoglobin 9.6 (*)    HCT 32.8 (*)    MCH 23.4 (*)    MCHC 29.3 (*)    RDW 18.5 (*)    All other components within normal limits  DIFFERENTIAL - Abnormal; Notable for the following components:   Neutro Abs 13.2 (*)    Lymphs Abs 0.6 (*)    All other components within normal limits  COMPREHENSIVE METABOLIC PANEL - Abnormal; Notable for the following components:   Chloride 112 (*)    CO2 21 (*)    Glucose, Bld 110 (*)    Creatinine, Ser 1.06 (*)    Calcium 8.5 (*)    Total Protein 6.2 (*)    Albumin 3.4 (*)    GFR calc non Af Amer 60 (*)    All other components within normal limits  ACETAMINOPHEN LEVEL - Abnormal; Notable for the following components:   Acetaminophen (Tylenol), Serum <10 (*)    All other components within normal limits  TSH - Abnormal; Notable for the following components:   TSH 0.175 (*)    All other components within normal limits  I-STAT CHEM 8, ED - Abnormal; Notable for the following components:   Chloride 112 (*)    Glucose, Bld 105 (*)    Calcium, Ion 0.98 (*)    Hemoglobin 11.2 (*)    HCT 33.0 (*)    All other components within  normal limits  SARS CORONAVIRUS 2 BY RT PCR (HOSPITAL ORDER, PERFORMED IN [facility])  CULTURE, BLOOD (ROUTINE X 2)  CULTURE, BLOOD (ROUTINE X 2)  PROTIME-INR  APTT  ETHANOL  SALICYLATE LEVEL  AMMONIA  URINALYSIS, ROUTINE W REFLEX MICROSCOPIC  RAPID URINE DRUG SCREEN, HOSP PERFORMED  LACTIC ACID, PLASMA  CBG MONITORING, ED  I-STAT BETA HCG BLOOD, ED ([facility], [facility], [facility] ONLY)    ED ECG REPORT   Date: 05/01/2020  Rate: 88  Rhythm: normal sinus rhythm  QRS Axis: normal  Intervals:  normal  ST/T Wave abnormalities: normal  Conduction Disutrbances:none  Narrative Interpretation:   Old EKG Reviewed: unchanged  I have personally reviewed the EKG tracing and agree with the computerized printout as noted.  Radiology DG Chest Port 1 View  Result Date: 05/01/2020 CLINICAL DATA:  Code stroke, altered mental status, slurred speech EXAM: PORTABLE CHEST 1 VIEW COMPARISON:  Portable exam 1318 hours compared to 04/24/2019 FINDINGS: Upper normal heart size. Mediastinal contours and pulmonary vascularity normal. Peribronchial thickening with mild atelectasis versus infiltrate at RIGHT base. Remaining lungs clear. No pleural effusion or pneumothorax. IMPRESSION: Bronchitic changes with atelectasis versus versus infiltrate at RIGHT base. Electronically Signed   By: Ulyses Southward M.D.   On: 05/01/2020 13:47   CT HEAD CODE STROKE WO CONTRAST  Result Date: 05/01/2020 CLINICAL DATA:  Code stroke.  Confusion and slurred speech. EXAM: CT HEAD WITHOUT CONTRAST TECHNIQUE: Contiguous axial images were obtained from the base of the skull through the vertex without intravenous contrast. COMPARISON:  Head CT 04/22/2019 and MRI 04/24/2019 FINDINGS: Brain: There is a small focus of hypoattenuation laterally in the right frontal operculum which may reflect artifact or an infarct. No acute infarct, intracranial hemorrhage, mass, midline shift, or extra-axial fluid collection is identified elsewhere. The ventricles are normal. Vascular: No hyperdense vessel. Skull: No fracture or suspicious osseous lesion. Sinuses/Orbits: Visualized paranasal sinuses and mastoid air cells are clear. Visualized orbits are unremarkable. Other: None. ASPECTS [facility] Stroke Program Early CT Score) - Ganglionic level infarction (caudate, lentiform nuclei, internal capsule, insula, M1-M3 cortex): 6 or 7 - Supraganglionic infarction (M4-M6 cortex): 3 Total score (0-10 with 10 being normal): 10 IMPRESSION: 1. Artifact versus small infarct  in the right frontal operculum. No hemorrhage. 2. ASPECTS is 9 or 10. These results were communicated to Dr. Roda Shutters at 12:48 pmon 7/24/2021by text page via the [facility] messaging system. Electronically Signed   By: Sebastian Ache M.D.   On: 05/01/2020 12:50    Procedures Procedures (including critical care time)  Medications Ordered in ED Medications  sodium chloride flush (NS) 0.9 % injection 3 mL (has no administration in time range)  sodium chloride 0.9 % bolus 1,000 mL (has no administration in time range)  cefTRIAXone (ROCEPHIN) 1 g in sodium chloride 0.9 % 100 mL IVPB (has no administration in time range)  azithromycin (ZITHROMAX) tablet 500 mg (has no administration in time range)    ED Course  I have reviewed the triage vital signs and the nursing notes.  Pertinent labs & imaging results that were available during my care of the patient were reviewed by me and considered in my medical decision making (see chart for details).  Patient presents as code stroke for encephalopathy and slurred speech.  She has history of similar symptoms.  Most recent hospitalization with negative work-up.  Will evaluate for metabolic cause.  Awaiting neurology recommendations.  White blood cell count noted be elevated.  Will check urine and chest  x-ray.  Vital signs reviewed and are as follows: BP (!) 110/53 (BP Location: Left Arm)   Pulse 87   Temp 98.1 F (36.7 C) (Oral)   Resp 18   Ht 5\' 6"  (1.676 m)   Wt 79.7 kg   LMP 04/08/2017 (Exact Date)   SpO2 100%   BMI 28.36 kg/m   1:01 PM Labs to this point unrevealing. CT with artifact vs infarct. Neuro reccs MRI w/wo.   1:52 PM chest x-ray reviewed personally.  Read as atelectasis versus infiltrate right base.  Given elevated white blood cell count with differential shift, will opt to treat for community-acquired pneumonia/aspiration PNA.  Lactate, blood cultures, antibiotics ordered.  Normal QTC on EKG.  2:55 PM Spoke with IMTS who will see patient.    CRITICAL CARE Performed by: 06/09/2017 PA-C Total critical care time: 40 minutes Critical care time was exclusive of separately billable procedures and treating other patients. Critical care was necessary to treat or prevent imminent or life-threatening deterioration. Critical care was time spent personally by me on the following activities: development of treatment plan with patient and/or surrogate as well as nursing, discussions with consultants, evaluation of patient's response to treatment, examination of patient, obtaining history from patient or surrogate, ordering and performing treatments and interventions, ordering and review of laboratory studies, ordering and review of radiographic studies, pulse oximetry and re-evaluation of patient's condition.       MDM Rules/Calculators/A&P                          Admit.    Final Clinical Impression(s) / ED Diagnoses Final diagnoses:  Community acquired pneumonia of right lower lobe of lung  Acute encephalopathy    Rx / DC Orders ED Discharge Orders    None       Renne Crigler, PA-C 05/01/20 1456    05/03/20, MD 05/01/20 1554

## 2020-05-01 NOTE — ED Notes (Signed)
Pt yelling and screaming, trying to get out of bed.  MD at bedside, pt pulling gown off,   Pt st's she is unable to stand but but continues to say she is getting out of bed to go to the bathroom.

## 2020-05-01 NOTE — ED Notes (Signed)
Unsuccessful IV access attempt x3. IV team consult placed.

## 2020-05-01 NOTE — ED Notes (Signed)
Attempted report 

## 2020-05-03 NOTE — Discharge Summary (Signed)
Name: Mary Dodson MRN: [id number] DOB: 02-15-1966 54 y.o. PCP: Lavinia Sharps, NP  Date of Admission: 05/01/2020 12:08 PM Date of Discharge: 05/01/2020 Attending Physician: No att. providers found  Discharge Diagnosis: 1. Patient Left AMA  Discharge Medications: Allergies as of 05/01/2020      Reactions   Morphine And Related Other (See Comments)   "Makes me crazy"   Trazodone And Nefazodone Other (See Comments)      Medication List    ASK your doctor about these medications   albuterol 108 (90 Base) MCG/ACT inhaler Commonly known as: VENTOLIN HFA Inhale 2 puffs into the lungs 4 (four) times daily as needed for wheezing or shortness of breath (cough).   AMINO ACID PO Take 1 tablet by mouth daily.   BC HEADACHE PO Take 6-7 packets by mouth daily as needed (headache).   benztropine 0.5 MG tablet Commonly known as: COGENTIN Take 1 tablet (0.5 mg total) by mouth at bedtime.   buPROPion 150 MG 12 hr tablet Commonly known as: WELLBUTRIN SR Take 150 mg by mouth 2 (two) times daily.   calcium carbonate 1500 (600 Ca) MG Tabs tablet Commonly known as: OSCAL Take 600 mg of elemental calcium by mouth daily with breakfast.   doxepin 75 MG capsule Commonly known as: SINEQUAN Take 75 mg by mouth at bedtime as needed (sleep).   ferrous sulfate 325 (65 FE) MG tablet Take 325 mg by mouth daily.   FISH OIL OMEGA-3 PO Take 1 tablet by mouth daily.   gabapentin 800 MG tablet Commonly known as: NEURONTIN Take 800 mg by mouth 3 (three) times daily. Ask about: Which instructions should I use?   HYDROcodone-acetaminophen 5-325 MG tablet Commonly known as: NORCO/VICODIN Take 1 tablet by mouth every 4 (four) hours as needed for moderate pain.   hydrOXYzine 10 MG tablet Commonly known as: ATARAX/VISTARIL Take 1 tablet (10 mg total) by mouth 3 (three) times daily as needed.   ibuprofen 800 MG tablet Commonly known as: ADVIL Take 800 mg by mouth 3 (three) times daily as  needed for fever or headache (pain).   levothyroxine 75 MCG tablet Commonly known as: SYNTHROID Take 75 mcg by mouth daily. Ask about: Which instructions should I use?   meloxicam 15 MG tablet Commonly known as: MOBIC Take 1 tablet (15 mg total) by mouth daily.   methocarbamol 500 MG tablet Commonly known as: Robaxin Take 1 tablet (500 mg total) by mouth 4 (four) times daily.   multivitamin with minerals tablet Take 1 tablet by mouth daily.   omeprazole 40 MG capsule Commonly known as: PRILOSEC Take 40 mg by mouth daily.   polyethylene glycol 17 g packet Commonly known as: MIRALAX / GLYCOLAX Take 17 g by mouth daily.   promethazine 25 MG tablet Commonly known as: PHENERGAN Take 25 mg by mouth daily as needed for nausea or vomiting.   QUEtiapine 100 MG tablet Commonly known as: SEROQUEL Take 1 tablet (100 mg total) by mouth 2 (two) times daily with breakfast and lunch. Also take 400 mg at bedtime   QUEtiapine 400 MG tablet Commonly known as: SEROQUEL Take 1.5 tablets (600 mg total) by mouth at bedtime. Also take 100 mg with breakfast and lunch   sertraline 100 MG tablet Commonly known as: ZOLOFT Take 1 tablet (100 mg total) by mouth daily after breakfast.       Disposition and follow-up:   Ms.Grover Zurn was discharged from [facility] in [place] condition.  At  the hospital follow up visit please address:  1.  Acute Encephalopathy: Etiology unknown. Has had work up at [facility] for similar episodes before. Continue following up.  Pneumonia: X ray showed evidence for pneumonia. Follow up for antibiotics to treat.  2.  Labs / imaging needed at time of follow-up: Chest x-ray  3.  Pending labs/ test needing follow-up: none  Follow-up Appointments: none made  Hospital Course by problem list: 1. Acute Encephalopathy: Was brought into the ER due to weakness and confusion. Has had similar episodes in the past. Was confused and agitated at initial  presentation. We admitted the patient and had begun work up. She later became more agitated and aggressive. She left AMA before work up could be completed.  2. Pneumonia: On presentation to the ER chest X was obtained. Results were bronchitic changes with atelectasis versus infiltrate at RIGHT base  Discharge Vitals:   BP (!) 104/58   Pulse 77   Temp 99.5 F (37.5 C) (Rectal)   Resp 12   Ht 5\' 6"  (1.676 m)   Wt 79.7 kg   LMP 04/08/2017 (Exact Date)   SpO2 94%   BMI 28.36 kg/m   Pertinent Labs, Studies, and Procedures:  CBC Latest Ref Rng & Units 05/01/2020 05/01/2020 04/27/2019  WBC 4.0 - 10.5 K/uL - 14.9(H) 6.3  Hemoglobin 12.0 - 15.0 g/dL 11.2(L) 9.6(L) 9.2(L)  Hematocrit 36 - 46 % 33.0(L) 32.8(L) 29.1(L)  Platelets 150 - 400 K/uL - 375 261   BMP Latest Ref Rng & Units 05/01/2020 05/01/2020 04/27/2019  Glucose 70 - 99 mg/dL 04/29/2019) 782(U) 89  BUN 6 - 20 mg/dL 15 13 6   Creatinine 0.44 - 1.00 mg/dL 235(T ) 6.14  Sodium 135 - 145 mmol/L 144 143 141  Potassium 3.5 - 5.1 mmol/L 3.8 3.5 4.0  Chloride 98 - 111 mmol/L 112(H) 112(H) 103  CO2 22 - 32 mmol/L - 21(L) 26  Calcium 8.9 - 10.3 mg/dL - 8.5(L) 8.7(L)   Chest X: Bronchitic changes with atelectasis versus infiltrate at RIGHT base.  Discharge Instructions: Left AMA  Signed: 4.31(V, MD 05/03/2020, 3:38 PM   Pager: @[email or web address] @

## 2020-05-04 LAB — CBG MONITORING, ED: Glucose-Capillary: 114 mg/dL — ABNORMAL HIGH (ref 70–99)

## 2020-05-06 LAB — CULTURE, BLOOD (ROUTINE X 2)
Culture: NO GROWTH
Culture: NO GROWTH

## 2020-05-10 ENCOUNTER — Other Ambulatory Visit: Payer: Self-pay

## 2020-05-10 ENCOUNTER — Ambulatory Visit (INDEPENDENT_AMBULATORY_CARE_PROVIDER_SITE_OTHER): Payer: No Payment, Other | Admitting: Clinical

## 2020-05-10 DIAGNOSIS — F324 Major depressive disorder, single episode, in partial remission: Secondary | ICD-10-CM

## 2020-05-13 ENCOUNTER — Ambulatory Visit (HOSPITAL_COMMUNITY): Payer: No Payment, Other | Admitting: Clinical

## 2020-05-16 NOTE — Progress Notes (Signed)
   THERAPIST PROGRESS NOTE Virtual Visit via Video Note  I connected with Mary Dodson on 05/10/2020 at  4:00 PM EDT by a video enabled telemedicine application and verified that I am speaking with the correct person using two identifiers.  Location: Patient: Community Provider: Office   I discussed the limitations of evaluation and management by telemedicine and the availability of in person appointments. The patient expressed understanding and agreed to proceed.    Follow Up Instructions:    I discussed the assessment and treatment plan with the patient. The patient was provided an opportunity to ask questions and all were answered. The patient agreed with the plan and demonstrated an understanding of the instructions.   The patient was advised to call back or seek an in-person evaluation if the symptoms worsen or if the condition fails to improve as anticipated.   Session Time: 30  Participation Level: Active  Behavioral Response: NAAlertEuthymic  Type of Therapy: Individual Therapy  Treatment Goals addressed: Diagnosis: Impulse Control  Interventions: CBT  Summary:  Mary Dodson is a 54 y.o. female who presents for the scheduled session oriented times five and friendly. Client denied hallucinations and delusions. Client reported on today doing well. Client reported she has been out since early morning pan handling. Client reported since the last session she has had contact with her ex boyfriend whom she left behind in [place]. Client shared since she left him years ago she has not had problems with maintaining sobriety and regained her independence by finding herself stable housing, maintaining her finances and having good social support. Client reported she decided to let him stay with her for a period of time to help him out and for female companionship.  Client discussed the pros and cons as prompted by the therapist of her choice. Client discussed she realizes she is  nervous to share the news with her children and best friend because she knows they'd be disappointed. Client discussed with the therapist at what cost could the decision to allow him to stay affect her mental health and other psychosocial factors. Client was prompted to think about her values and her boundaries and to discuss the decision with a trusted person before finalizing the decision to weigh the pros and cons.     Suicidal/Homicidal: Nowithout intent/plan  Therapist Response:  Therapist checked in with the client to discuss how she has been since the last session and new updates. Therapist actively listened while allowing time to focus on the clients thoughts and feelings. Therapist collaborated with the client to identify her values and boundaries. Therapist instructed the client to consider her long term outcomes in choices made by utilizing her support to assist with decisions. Therapist assisted scheduling the client for next appointments.  Plan: Return again in 3 weeks for individual therapy.  Diagnosis: Major depressive disorder with single episode, in partial remission    Neena Rhymes Jeffre Enriques, LCSW 05/16/2020

## 2020-05-20 ENCOUNTER — Other Ambulatory Visit: Payer: Self-pay | Admitting: *Deleted

## 2020-06-21 ENCOUNTER — Other Ambulatory Visit: Payer: Self-pay

## 2020-06-21 ENCOUNTER — Ambulatory Visit (INDEPENDENT_AMBULATORY_CARE_PROVIDER_SITE_OTHER): Payer: No Payment, Other | Admitting: Clinical

## 2020-06-21 DIAGNOSIS — F324 Major depressive disorder, single episode, in partial remission: Secondary | ICD-10-CM

## 2020-06-21 NOTE — Progress Notes (Signed)
THERAPIST PROGRESS NOTE Virtual Visit via Telephone Note  I connected with Mary Dodson on 06/21/20 at  3:00 PM EDT by telephone and verified that I am speaking with the correct person using two identifiers.  Location: Patient: Home Provider: Office   I discussed the limitations, risks, security and privacy concerns of performing an evaluation and management service by telephone and the availability of in person appointments. I also discussed with the patient that there may be a patient responsible charge related to this service. The patient expressed understanding and agreed to proceed.    Follow Up Instructions:  I discussed the assessment and treatment plan with the patient. The patient was provided an opportunity to ask questions and all were answered. The patient agreed with the plan and demonstrated an understanding of the instructions.   The patient was advised to call back or seek an in-person evaluation if the symptoms worsen or if the condition fails to improve as anticipated.  I provided 25 minutes of non-face-to-face time during this encounter.    Session Time: 25 minutes  Participation Level: Active  Behavioral Response: NAAlertNA  Type of Therapy: Individual Therapy  Treatment Goals addressed: Diagnosis: Depression  Interventions: CBT  Summary:  Mary Dodson is a 54 y.o. female who presents oriented times five and friendly. Client denied hallucinations and delusions. Client reported on today she is doing "okay". Client reported since the last session she had a good three week visit with her daughters. Client reported she was able to have bonding time with her granddaughter that is one years old now. Client reported [place] and [place].Client discussed with the therapist as she previously reported a past boyfriend potentially coming to stay with her. Client reported he did not because he relapsed terribly using drugs.  Client reported her called her frantically and sounded paranoid. Client reported she has not heard from him in a couple days and is not sure what has happened to him. Client discussed with the therapist feeling glad that, that did not happen at her house.  Client reported her thoughts on the visit with her daughters. Client reported her daughters are opposite of how she was with her mother. Client reported its hard to tell her daughters anything. Client reflected about her mother and discussed how close she was to her and that she enjoyed hearing and learning everything from her. Client reported she plans on going back to visit in a couple of months.  Client reported in regards to physical health she "feels like something is wrong". Client reported she has been feeling dizzy and disoriented infrequently throughout the day. Client reported she will tell her friend so someone will be aware of what's going on. Client reported this month she is going to [facility] for a 6 hour study to run more tests on what's going on.    Suicidal/Homicidal: Nowithout intent/plan  Therapist Response:  Therapist began the session by asking open ended questions about how she has been doing since the last session. Therapist actively listened to the clients thoughts and feelings. Therapist used CBT to engage the client in discussion about developing healthy cognitive patterns and beliefs to alleviate depression. Therapist discussed self -care and following up on doctor appointments for physical health. Therapist assisted with scheduling next appointments.    Plan: Return again in 2 weeks for individual therapy.  Diagnosis: Major depressive disorder, with single episode, in partial remission    Mary Fayette Y Cordale Manera,  LCSW 06/21/2020

## 2020-06-22 ENCOUNTER — Encounter (HOSPITAL_COMMUNITY): Payer: Self-pay | Admitting: Psychiatry

## 2020-06-22 ENCOUNTER — Telehealth (INDEPENDENT_AMBULATORY_CARE_PROVIDER_SITE_OTHER): Payer: No Payment, Other | Admitting: Psychiatry

## 2020-06-22 ENCOUNTER — Other Ambulatory Visit: Payer: Self-pay

## 2020-06-22 DIAGNOSIS — F319 Bipolar disorder, unspecified: Secondary | ICD-10-CM

## 2020-06-22 DIAGNOSIS — F324 Major depressive disorder, single episode, in partial remission: Secondary | ICD-10-CM

## 2020-06-22 MED ORDER — SERTRALINE HCL 100 MG PO TABS: 100.0000 mg | ORAL_TABLET | Freq: Every day | ORAL | 1 refills | Status: DC

## 2020-06-22 MED ORDER — QUETIAPINE FUMARATE 400 MG PO TABS: 600.0000 mg | ORAL_TABLET | Freq: Every day | ORAL | 2 refills | Status: DC

## 2020-06-22 MED ORDER — QUETIAPINE FUMARATE 100 MG PO TABS: 100.0000 mg | ORAL_TABLET | Freq: Two times a day (BID) | ORAL | 1 refills | Status: DC

## 2020-06-22 MED ORDER — RAMELTEON 8 MG PO TABS: 8.0000 mg | ORAL_TABLET | Freq: Every day | ORAL | 2 refills | Status: DC

## 2020-06-22 MED ORDER — BUPROPION HCL ER (SR) 150 MG PO TB12: 150.0000 mg | ORAL_TABLET | Freq: Two times a day (BID) | ORAL | 2 refills | Status: DC

## 2020-06-22 MED ORDER — BENZTROPINE MESYLATE 0.5 MG PO TABS: 0.5000 mg | ORAL_TABLET | Freq: Every day | ORAL | 1 refills | Status: DC

## 2020-06-22 MED ORDER — HYDROXYZINE HCL 10 MG PO TABS: 10.0000 mg | ORAL_TABLET | Freq: Three times a day (TID) | ORAL | 2 refills | Status: DC | PRN

## 2020-06-22 NOTE — Progress Notes (Signed)
BH MD/PA/NP OP Progress Note Virtual Visit via Telephone Note  I connected with Mary Dodson on 06/22/20 at  3:30 PM EDT by telephone and verified that I am speaking with the correct person using two identifiers.  Location: Patient: home Provider: Clinic   I discussed the limitations, risks, security and privacy concerns of performing an evaluation and management service by telephone and the availability of in person appointments. I also discussed with the patient that there may be a patient responsible charge related to this service. The patient expressed understanding and agreed to proceed.   I provided 30 minutes of non-face-to-face time during this encounter.    06/22/2020 4:17 PM Mary Dodson  MRN:  [id number]  Chief Complaint:  "I am even keeled"  HPI:  54 year old female seen today for followup psychiatric evaluation.   She has a history of alcohol use disorder (in remission), major depression, and bipolar 1disorder.  She is currently prescribed Zoloft 100 mg in the morning, Cogentin 0.5 mg daily, quetiapine 100 mg twice daily,  quetiapine 600 mg at bedtime, doxepin 75 mg (for itching) at bedtime as needed, and gabapentin 800 mg 4 times a day. She reports that she has her doxepin and gabapentin filled at the [facility]).  Patient notes that she has only been taken Seroquel 400 mg at night instead of 600 mg.   Patient was talkative during exam and have pressured speech.  She denies anxiety and depression and notes that she is even keeled. Patient informed provider that she visited her 54 year old, 65 year old daughter, and grandchild in [place]. She notes that while there she visited the [facility] and described it as ineffable noting that it was indescribable. She reports that she is not moody but discribes her two daughters as moody. She notes that she conceived her daughters via sperm donation and notes that they were estranged for 10 years however notes that  they have been building bonds for the last 2 years. At times she notes that she is  distractibility, has elevated mood, and impulsive activity.  She denied irritability,  VAH, delusions, paranoia, financial extravagance, and sexually inappropriate behaviors.  During conversation patient notes that she dislikes Republicans. She discussed politics briefly and then randomly jumped to other topics such as past alcohol use and blacking out. She also informed provider that she has poor memory and notes that she will be seeing a neurologist soon.  She is agreeable to increasing Seroquel at bedtime to 600 mg at bedtime to help manage symptoms of mania.  She will continue all other medications as prescribed. She plans to follow-up with outpatient counselor for therapy.  No other concerns noted at this time.  Visit Diagnosis:    ICD-10-CM   1. Bipolar I disorder (HCC)  F31.9 benztropine (COGENTIN) 0.5 MG tablet    QUEtiapine (SEROQUEL) 100 MG tablet    QUEtiapine (SEROQUEL) 400 MG tablet    ramelteon (ROZEREM) 8 MG tablet  2. Major depressive disorder with single episode, in partial remission (HCC)  F32.4 buPROPion (WELLBUTRIN SR) 150 MG 12 hr tablet    hydrOXYzine (ATARAX/VISTARIL) 10 MG tablet    QUEtiapine (SEROQUEL) 100 MG tablet    QUEtiapine (SEROQUEL) 400 MG tablet    sertraline (ZOLOFT) 100 MG tablet    Past Psychiatric History:  alcohol use disorder (in remission), major depression, and bipolar 1disorder.  Past Medical History:  Past Medical History:  Diagnosis Date  . Alcohol abuse   . Bipolar  disease, chronic (HCC)   . Hypothyroidism (acquired)   . PTSD (post-traumatic stress disorder)    No past surgical history on file.  Family Psychiatric History:  Mother committed suicide in 2  Family History:  Family History  Problem Relation Age of Onset  . Cancer Mother        suicide  . Breast cancer Maternal Grandmother   . Cancer Maternal Grandmother   . Heart attack Father      Social History:  Social History   Socioeconomic History  . Marital status: Single    Spouse name: Not on file  . Number of children: 2  . Years of education: Not on file  . Highest education level: Bachelor's degree (e.g., BA, AB, BS)  Occupational History  . Occupation: unemployed  Tobacco Use  . Smoking status: Former Smoker    Packs/day: 1.00    Types: Cigarettes    Quit date: 04/30/2019    Years since quitting: 1.1  . Smokeless tobacco: Never Used  Vaping Use  . Vaping Use: Some days  Substance and Sexual Activity  . Alcohol use: Not Currently    Comment: recovering alcoholic  . Drug use: No  . Sexual activity: Not Currently    Birth control/protection: None  Other Topics Concern  . Not on file  Social History Narrative   ** Merged History Encounter **       ** Merged History Encounter **      Patient is right-handed. She lives alone in a one level home. She drinks 5 cups of coffee a week, and 6 cans of Diet-Pepsi a day. Patient states she uses 6-8 BC powders a day.        Social Determinants of Health   Financial Resource Strain:   . Difficulty of Paying Living Expenses: Not on file  Food Insecurity:   . Worried About Programme researcher, broadcasting/film/video in the Last Year: Not on file  . Ran Out of Food in the Last Year: Not on file  Transportation Needs: No Transportation Needs  . Lack of Transportation (Medical): No  . Lack of Transportation (Non-Medical): No  Physical Activity:   . Days of Exercise per Week: Not on file  . Minutes of Exercise per Session: Not on file  Stress:   . Feeling of Stress : Not on file  Social Connections:   . Frequency of Communication with Friends and Family: Not on file  . Frequency of Social Gatherings with Friends and Family: Not on file  . Attends Religious Services: Not on file  . Active Member of Clubs or Organizations: Not on file  . Attends Banker Meetings: Not on file  . Marital Status: Not on file    Allergies:   Allergies  Allergen Reactions  . Morphine And Related Other (See Comments)    "Makes me crazy"  . Trazodone And Nefazodone Other (See Comments)    Metabolic Disorder Labs: Lab Results  Component Value Date   HGBA1C 5.4 10/03/2016   MPG 108 10/03/2016   Lab Results  Component Value Date   PROLACTIN 21.6 10/03/2016   Lab Results  Component Value Date   CHOL 208 (H) 10/03/2016   TRIG 100 04/27/2019   HDL 128 10/03/2016   CHOLHDL 1.6 10/03/2016   VLDL 9 10/03/2016   LDLCALC 71 10/03/2016   Lab Results  Component Value Date   TSH 0.175 (L) 05/01/2020   TSH 0.210 (L) 04/21/2019    Therapeutic Level Labs: No  results found for: LITHIUM No results found for: VALPROATE No components found for:  CBMZ  Current Medications: Current Outpatient Medications  Medication Sig Dispense Refill  . albuterol (VENTOLIN HFA) 108 (90 Base) MCG/ACT inhaler Inhale 2 puffs into the lungs 4 (four) times daily as needed for wheezing or shortness of breath (cough).     . Amino Acids (AMINO ACID PO) Take 1 tablet by mouth daily.    . Aspirin-Salicylamide-Caffeine (BC HEADACHE PO) Take 6-7 packets by mouth daily as needed (headache).    . benztropine (COGENTIN) 0.5 MG tablet Take 1 tablet (0.5 mg total) by mouth at bedtime. 30 tablet 1  . buPROPion (WELLBUTRIN SR) 150 MG 12 hr tablet Take 1 tablet (150 mg total) by mouth 2 (two) times daily. 60 tablet 2  . calcium carbonate (OSCAL) 1500 (600 Ca) MG TABS tablet Take 600 mg of elemental calcium by mouth daily with breakfast.    . doxepin (SINEQUAN) 75 MG capsule Take 75 mg by mouth at bedtime as needed (sleep).     . ferrous sulfate 325 (65 FE) MG tablet Take 325 mg by mouth daily.    Marland Kitchen gabapentin (NEURONTIN) 800 MG tablet Take 800 mg by mouth 3 (three) times daily.    Marland Kitchen HYDROcodone-acetaminophen (NORCO/VICODIN) 5-325 MG tablet Take 1 tablet by mouth every 4 (four) hours as needed for moderate pain. 16 tablet 0  . hydrOXYzine (ATARAX/VISTARIL) 10 MG  tablet Take 1 tablet (10 mg total) by mouth 3 (three) times daily as needed. 90 tablet 2  . ibuprofen (ADVIL) 800 MG tablet Take 800 mg by mouth 3 (three) times daily as needed for fever or headache (pain).     Marland Kitchen levothyroxine (SYNTHROID) 75 MCG tablet Take 75 mcg by mouth daily.    . meloxicam (MOBIC) 15 MG tablet Take 1 tablet (15 mg total) by mouth daily. 30 tablet 0  . methocarbamol (ROBAXIN) 500 MG tablet Take 1 tablet (500 mg total) by mouth 4 (four) times daily. 16 tablet 0  . Multiple Vitamins-Minerals (MULTIVITAMIN WITH MINERALS) tablet Take 1 tablet by mouth daily.    . Omega-3 Fatty Acids (FISH OIL OMEGA-3 PO) Take 1 tablet by mouth daily.    Marland Kitchen omeprazole (PRILOSEC) 40 MG capsule Take 40 mg by mouth daily.  0  . polyethylene glycol (MIRALAX / GLYCOLAX) 17 g packet Take 17 g by mouth daily.    . promethazine (PHENERGAN) 25 MG tablet Take 25 mg by mouth daily as needed for nausea or vomiting.   0  . QUEtiapine (SEROQUEL) 100 MG tablet Take 1 tablet (100 mg total) by mouth 2 (two) times daily with breakfast and lunch. Also take 400 mg at bedtime 60 tablet 1  . QUEtiapine (SEROQUEL) 400 MG tablet Take 1.5 tablets (600 mg total) by mouth at bedtime. Also take 100 mg with breakfast and lunch 60 tablet 2  . ramelteon (ROZEREM) 8 MG tablet Take 1 tablet (8 mg total) by mouth at bedtime. 30 tablet 2  . sertraline (ZOLOFT) 100 MG tablet Take 1 tablet (100 mg total) by mouth daily after breakfast. 30 tablet 1   No current facility-administered medications for this visit.     Musculoskeletal: Strength & Muscle Tone: Unable to asses due to telephone visit. Patient unable to log in virtually Gait & Station: Unable to asses due to telephone visit. Patient unable to log in virtually Patient leans: N/A  Psychiatric Specialty Exam: Review of Systems  Last menstrual period 04/08/2017, unknown if currently breastfeeding.There  is no height or weight on file to calculate BMI.  General Appearance:  Unable to asses due to telephone visit. Patient unable to log in virtually  Eye Contact:  Unable to asses due to telephone visit. Patient unable to log in virtually  Speech:  Clear and Coherent and Pressured  Volume:  Normal  Mood:  Euthymic  Affect:  Appropriate and Congruent  Thought Process:  Coherent, Goal Directed and Linear  Orientation:  Full (Time, Place, and Person)  Thought Content: WDL and Logical   Suicidal Thoughts:  No  Homicidal Thoughts:  No  Memory:  Immediate;   Fair Recent;   Fair Remote;   Fair  Judgement:  Fair  Insight:  Fair  Psychomotor Activity:  Normal  Concentration:  Concentration: Fair and Attention Span: Fair  Recall:  [employer]Fair  Fund of Knowledge: Good  Language: Good  Akathisia:  No  Handed:  Right  AIMS (if indicated): Not done  Assets:  Communication Skills Desire for Improvement Financial Resources/Insurance Housing Social Support  ADL's:  Intact  Cognition: WNL  Sleep:  Good   Screenings: AIMS     [facility]  AIMS Total Score 0    AUDIT     [facility]  Alcohol Use Disorder Identification Test Final Score (AUDIT) 25    GAD-7     [facility]  Total GAD-7 Score 11    PHQ2-9     [facility]  PHQ-2 Total Score 4  PHQ-9 Total Score 12       Assessment and Plan: Today patient presents with symptoms of hypomania.  She endorses impulsivity, elevated mood, irritability,  and distractibility.  She is agreeable to increasing Seroquel 400 mg to 600 mg nightly to help stabilize her mood. She will continue all other medications as prescribed.   She [facility] as her preferred pharmacy does not dispense the medication.   1. Major depressive  disorder with single episode, in partial remission (HCC)  Continue- QUEtiapine (SEROQUEL) 100 MG tablet; Take 1 tablet (100 mg total) by mouth 2 (two) times daily with breakfast and lunch. Also take 400 mg at bedtime  Dispense: 60 tablet; Refill: 2 Increased- QUEtiapine (SEROQUEL) 400 MG tablet; Take 1.5 tablets (600 mg total) by mouth at bedtime. Also take 100 mg with breakfast and lunch  Dispense: 45 tablet; Refill: 2 Continue- sertraline (ZOLOFT) 100 MG tablet; Take 1 tablet (100 mg total) by mouth daily after breakfast.  Dispense: 30 tablet; Refill: 2  2. Bipolar I disorder (HCC)  Continue- benztropine (COGENTIN) 0.5 MG tablet; Take 1 tablet (0.5 mg total) by mouth at bedtime.  Dispense: 30 tablet; Refill: 2 Continue- QUEtiapine (SEROQUEL) 100 MG tablet; Take 1 tablet (100 mg total) by mouth 2 (two) times daily with breakfast and lunch. Also take 400 mg at bedtime  Dispense: 60 tablet; Refill: 2 Increased- QUEtiapine (SEROQUEL) 400 MG tablet; Take 1.5 tablets (600 mg total) by mouth at bedtime. Also take 100 mg with breakfast and lunch  Dispense: 45 tablet; Refill: 2  Follow-up in 3 months Follow-up with therapy    Shanna CiscoBrittney E Loi Rennaker, NP 06/22/2020, 4:17 PM

## 2020-06-28 ENCOUNTER — Ambulatory Visit: Payer: Self-pay | Admitting: *Deleted

## 2020-06-28 DIAGNOSIS — Z23 Encounter for immunization: Secondary | ICD-10-CM

## 2020-06-28 NOTE — Progress Notes (Signed)
Patient tolerated injection well today 

## 2020-07-01 ENCOUNTER — Other Ambulatory Visit: Payer: Self-pay

## 2020-07-01 ENCOUNTER — Ambulatory Visit: Payer: Self-pay | Admitting: Physician Assistant

## 2020-07-01 VITALS — BP 93/56 | HR 82 | Temp 97.9°F | Resp 18 | Ht 66.0 in

## 2020-07-01 DIAGNOSIS — B372 Candidiasis of skin and nail: Secondary | ICD-10-CM

## 2020-07-01 MED ORDER — NYSTATIN 100000 UNIT/GM EX POWD: 1.0000 "application " | Freq: Three times a day (TID) | CUTANEOUS | 0 refills | Status: DC

## 2020-07-01 NOTE — Progress Notes (Signed)
New Patient Office Visit  Subjective:  Patient ID: Mary Dodson, female    DOB: 1966-07-03  Age: 54 y.o. MRN: [id number]  CC:  Chief Complaint  Patient presents with  . Annual Exam    HPI Mary Dodson reports that she has had a rash under her breasts for the past week, has been using lotrazim lotion with a little relief. Denies any new meds, lotions, detergents, states that she recently traveled to [place] and the dry air caused her to developed a rash.    States that she was seen by her PCP last week for this complaint and has appointment to follow up with her at the beginning of Oct.  States that she is followed closely by her   No other complaints at this time     Past Medical History:  Diagnosis Date  . Alcohol abuse   . Bipolar disease, chronic (HCC)   . Hypothyroidism (acquired)   . PTSD (post-traumatic stress disorder)     History reviewed. No pertinent surgical history.  Family History  Problem Relation Age of Onset  . Cancer Mother        suicide  . Breast cancer Maternal Grandmother   . Cancer Maternal Grandmother   . Heart attack Father     Social History   Socioeconomic History  . Marital status: Single    Spouse name: Not on file  . Number of children: 2  . Years of education: Not on file  . Highest education level: Bachelor's degree (e.g., BA, AB, BS)  Occupational History  . Occupation: unemployed  Tobacco Use  . Smoking status: Former Smoker    Packs/day: 1.00    Types: Cigarettes    Quit date: 04/30/2019    Years since quitting: 1.1  . Smokeless tobacco: Never Used  Vaping Use  . Vaping Use: Some days  Substance and Sexual Activity  . Alcohol use: Not Currently    Comment: recovering alcoholic  . Drug use: No  . Sexual activity: Not Currently    Birth control/protection: None  Other Topics Concern  . Not on file  Social History Narrative   ** Merged History Encounter **       ** Merged History Encounter **      Patient is  right-handed. She lives alone in a one level home. She drinks 5 cups of coffee a week, and 6 cans of Diet-Pepsi a day. Patient states she uses 6-8 BC powders a day.        Social Determinants of Health   Financial Resource Strain:   . Difficulty of Paying Living Expenses: Not on file  Food Insecurity:   . Worried About Programme researcher, broadcasting/film/video in the Last Year: Not on file  . Ran Out of Food in the Last Year: Not on file  Transportation Needs: No Transportation Needs  . Lack of Transportation (Medical): No  . Lack of Transportation (Non-Medical): No  Physical Activity:   . Days of Exercise per Week: Not on file  . Minutes of Exercise per Session: Not on file  Stress:   . Feeling of Stress : Not on file  Social Connections:   . Frequency of Communication with Friends and Family: Not on file  . Frequency of Social Gatherings with Friends and Family: Not on file  . Attends Religious Services: Not on file  . Active Member of Clubs or Organizations: Not on file  . Attends Banker Meetings: Not on file  .  Marital Status: Not on file  Intimate Partner Violence:   . Fear of Current or Ex-Partner: Not on file  . Emotionally Abused: Not on file  . Physically Abused: Not on file  . Sexually Abused: Not on file    ROS Review of Systems  Objective:   Today's Vitals: BP (!) 93/56 (BP Location: Left Arm, Patient Position: Sitting, Cuff Size: Large)   Pulse 82   Temp 97.9 F (36.6 C) (Oral)   Resp 18   Ht 5\' 6"  (1.676 m)   LMP 04/08/2017 (Exact Date)   SpO2 96%   BMI 28.36 kg/m   Physical Exam  Assessment & Plan:   Problem List Items Addressed This Visit      Musculoskeletal and Integument   Yeast infection of the skin - Primary   Relevant Medications   nystatin (MYCOSTATIN/NYSTOP) powder      Outpatient Encounter Medications as of 07/01/2020  Medication Sig  . albuterol (VENTOLIN HFA) 108 (90 Base) MCG/ACT inhaler Inhale 2 puffs into the lungs 4 (four) times  daily as needed for wheezing or shortness of breath (cough).   . Amino Acids (AMINO ACID PO) Take 1 tablet by mouth daily.  . Aspirin-Salicylamide-Caffeine (BC HEADACHE PO) Take 6-7 packets by mouth daily as needed (headache).  . benztropine (COGENTIN) 0.5 MG tablet Take 1 tablet (0.5 mg total) by mouth at bedtime.  07/03/2020 buPROPion (WELLBUTRIN SR) 150 MG 12 hr tablet Take 1 tablet (150 mg total) by mouth 2 (two) times daily.  . calcium carbonate (OSCAL) 1500 (600 Ca) MG TABS tablet Take 600 mg of elemental calcium by mouth daily with breakfast.  . doxepin (SINEQUAN) 75 MG capsule Take 75 mg by mouth at bedtime as needed (sleep).   . ferrous sulfate 325 (65 FE) MG tablet Take 325 mg by mouth daily.  Marland Kitchen gabapentin (NEURONTIN) 800 MG tablet Take 800 mg by mouth 3 (three) times daily.  . hydrOXYzine (ATARAX/VISTARIL) 10 MG tablet Take 1 tablet (10 mg total) by mouth 3 (three) times daily as needed.  Marland Kitchen ibuprofen (ADVIL) 800 MG tablet Take 800 mg by mouth 3 (three) times daily as needed for fever or headache (pain).   Marland Kitchen levothyroxine (SYNTHROID) 75 MCG tablet Take 75 mcg by mouth daily.  . Multiple Vitamins-Minerals (MULTIVITAMIN WITH MINERALS) tablet Take 1 tablet by mouth daily.  . Omega-3 Fatty Acids (FISH OIL OMEGA-3 PO) Take 1 tablet by mouth daily.  Marland Kitchen omeprazole (PRILOSEC) 40 MG capsule Take 40 mg by mouth daily.  . polyethylene glycol (MIRALAX / GLYCOLAX) 17 g packet Take 17 g by mouth daily.  . promethazine (PHENERGAN) 25 MG tablet Take 25 mg by mouth daily as needed for nausea or vomiting.   Marland Kitchen QUEtiapine (SEROQUEL) 100 MG tablet Take 1 tablet (100 mg total) by mouth 2 (two) times daily with breakfast and lunch. Also take 400 mg at bedtime  . QUEtiapine (SEROQUEL) 400 MG tablet Take 1.5 tablets (600 mg total) by mouth at bedtime. Also take 100 mg with breakfast and lunch  . ramelteon (ROZEREM) 8 MG tablet Take 1 tablet (8 mg total) by mouth at bedtime.  . sertraline (ZOLOFT) 100 MG tablet Take 1  tablet (100 mg total) by mouth daily after breakfast.  . HYDROcodone-acetaminophen (NORCO/VICODIN) 5-325 MG tablet Take 1 tablet by mouth every 4 (four) hours as needed for moderate pain. (Patient not taking: Reported on 07/01/2020)  . meloxicam (MOBIC) 15 MG tablet Take 1 tablet (15 mg total) by mouth daily. (Patient  not taking: Reported on 07/01/2020)  . methocarbamol (ROBAXIN) 500 MG tablet Take 1 tablet (500 mg total) by mouth 4 (four) times daily. (Patient not taking: Reported on 07/01/2020)  . nystatin (MYCOSTATIN/NYSTOP) powder Apply 1 application topically 3 (three) times daily.   No facility-administered encounter medications on file as of 07/01/2020.  1. Yeast infection of the skin Patient education given.  - nystatin (MYCOSTATIN/NYSTOP) powder; Apply 1 application topically 3 (three) times daily.  Dispense: 15 g; Refill: 0   I have reviewed the patient's medical history (PMH, PSH, Social History, Family History, Medications, and allergies) , and have been updated if relevant. I spent 15 minutes reviewing chart and  face to face time with patient.    Follow-up: Return if symptoms worsen or fail to improve.   Kasandra Knudsen Mayers, PA-C

## 2020-07-01 NOTE — Patient Instructions (Signed)
You can use Nystatin powder to help resolve your yeast infection  Please let us know if there is anything else we can do for you  Roney Jaffe, PA-C Physician Assistant [facility] [email or web address]   Skin Yeast Infection  A skin yeast infection is a condition in which there is an overgrowth of yeast (candida) that normally lives on the skin. This condition usually occurs in areas of the skin that are constantly warm and moist, such as the armpits or the groin. What are the causes? This condition is caused by a change in the normal balance of the yeast and bacteria that live on the skin. What increases the risk? You are more likely to develop this condition if you:  Are obese.  Are pregnant.  Take birth control pills.  Have diabetes.  Take antibiotic medicines.  Take steroid medicines.  Are malnourished.  Have a weak body defense system (immune system).  Are 33 years of age or older.  Wear tight clothing. What are the signs or symptoms? The most common symptom of this condition is itchiness in the affected area. Other symptoms include:  Red, swollen area of the skin.  Bumps on the skin. How is this diagnosed?  This condition is diagnosed with a medical history and physical exam.  Your health care provider may check for yeast by taking light scrapings of the skin to be viewed under a microscope. How is this treated? This condition is treated with medicine. Medicines may be prescribed or be available over the counter. The medicines may be:  Taken by mouth (orally).  Applied as a cream or powder to your skin. Follow these instructions at home:   Take or apply over-the-counter and prescription medicines only as told by your health care provider.  Maintain a healthy weight. If you need help losing weight, talk with your health care provider.  Keep your skin clean and dry.  If you have diabetes, keep  your blood sugar under control.  Keep all follow-up visits as told by your health care provider. This is important. Contact a health care provider if:  Your symptoms go away and then return.  Your symptoms do not get better with treatment.  Your symptoms get worse.  Your rash spreads.  You have a fever or chills.  You have new symptoms.  You have new warmth or redness of your skin. Summary  A skin yeast infection is a condition in which there is an overgrowth of yeast (candida) that normally lives on the skin. This condition is caused by a change in the normal balance of the yeast and bacteria that live on the skin.  Take or apply over-the-counter and prescription medicines only as told by your health care provider.  Keep your skin clean and dry.  Contact a health care provider if your symptoms do not get better with treatment. This information is not intended to replace advice given to you by your health care provider. Make sure you discuss any questions you have with your health care provider. Document Revised: 02/12/2018 Document Reviewed: 02/12/2018 Elsevier Patient Education  2020 [employer].

## 2020-07-02 ENCOUNTER — Telehealth: Payer: Self-pay | Admitting: *Deleted

## 2020-07-02 NOTE — Telephone Encounter (Signed)
Pt needs prescription sent to [facility]

## 2020-07-05 ENCOUNTER — Ambulatory Visit (INDEPENDENT_AMBULATORY_CARE_PROVIDER_SITE_OTHER): Payer: No Payment, Other | Admitting: Clinical

## 2020-07-05 ENCOUNTER — Other Ambulatory Visit: Payer: Self-pay

## 2020-07-05 DIAGNOSIS — F319 Bipolar disorder, unspecified: Secondary | ICD-10-CM | POA: Diagnosis not present

## 2020-07-05 MED FILL — NYSTATIN 100,000 UNIT/GM CR: 100000 | 7 days supply | Qty: 15 | Fill #0

## 2020-07-05 NOTE — Telephone Encounter (Signed)
Patient came into the office and wanted to know more about why she doesn't have a blue card. Patient requested to speak with financial counselor regarding what she was approved for. Please follow up at your earliest convenience.

## 2020-07-05 NOTE — Telephone Encounter (Signed)
Pt was inform she she apply by mail for CAFA that is why she did not received the Rx card and if she want to continue see the PCP outside [facility] she need to apply by mail to them

## 2020-07-06 NOTE — Progress Notes (Signed)
   THERAPIST PROGRESS NOTE  Session Time: 45 minutes  Participation Level: Active  Behavioral Response: CasualAlertEuthymic  Type of Therapy: Individual Therapy  Treatment Goals addressed: Diagnosis: Depression   Interventions: CBT  Summary:  Mary Dodson is a 54 y.o. female who presents for the session oriented times five, appropriately dressed, and friendly. Client denied hallucinations and delusions. Client reported on today feeling better. Client reported compared to the prior session her episode of dizziness, light headed, and headaches have subsided. Client reported she is still going through on going testing for her medical concerns. Client reported she has been having with trouble as it has been reported that she is snoring alarmingly during the night. Client reported she is being referred for a sleep test for sleep apnea. Client reported at her upcoming appointment she will be getting a machine to help her sleep during the night. Client reported she has had problems with sleep in the past related to her weight. Client reported she wants to work on losing weight and eating better to help with that problem. Client reported she has also confided with her close friend about her previous decision to let her ex boyfriend stay with her. Client reported her friend was not as mad as she thought and understood how someone can get lonely for company at times. Client reported she is glad things did not work out the way it was intended. Client discussed her boundaries and not putting herself in a compromising situation due to an impulsive emotional feeling. Client discussed that she has interests in engaging with another AA to work the steps. Client reported before she did attended but did not follow the steps but reflecting back the simplicity of the steps helps to provide organization to her life as additional support.       Suicidal/Homicidal: Nowithout intent/plan  Therapist Response:   Therapist initiated a open discussion by checking in and asking how she has been doing since the last session.  Therapist utilized active listening, direct eye contact and empathy while being attentive to the clients thoughts and feelings. Therapist engaged the client is a discussion by prompting about challenges she has encountered that have tested her ability to implement making healthier decisions. Therapist collaborated with the client to identify initial reasoning for choices and how she could continue to challenge past patterns of unhealthy thinking patterns to make better choices.     Plan: Return again in 4 weeks for individual therapy.  Diagnosis: Bipolar 1 disorder  Neena Rhymes [name], LCSW 07/06/2020

## 2020-07-07 ENCOUNTER — Other Ambulatory Visit: Payer: Self-pay | Admitting: Physician Assistant

## 2020-07-07 ENCOUNTER — Telehealth: Payer: Self-pay

## 2020-07-07 DIAGNOSIS — B372 Candidiasis of skin and nail: Secondary | ICD-10-CM

## 2020-07-07 MED ORDER — NYSTATIN 100000 UNIT/GM EX POWD: 1.0000 "application " | Freq: Three times a day (TID) | CUTANEOUS | 0 refills | Status: AC

## 2020-07-07 NOTE — Telephone Encounter (Signed)
Patient reached out about medication location needing to be changed. Suggested [facility] to pick up medications.

## 2020-07-07 NOTE — Telephone Encounter (Signed)
completed

## 2020-07-08 MED FILL — DOXEPIN HCL 75 MG CAPS: 75 | 30 days supply | Qty: 30 | Fill #0

## 2020-07-08 MED FILL — NYSTATIN 100,000 UNIT/GM PO: 100000 | 7 days supply | Qty: 15 | Fill #0

## 2020-07-16 ENCOUNTER — Encounter (INDEPENDENT_AMBULATORY_CARE_PROVIDER_SITE_OTHER): Payer: Self-pay
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
## 2020-07-21 MED FILL — DOXEPIN HCL 75 MG CAPS: 75 | 30 days supply | Qty: 30 | Fill #0

## 2020-07-28 ENCOUNTER — Other Ambulatory Visit: Payer: Self-pay

## 2020-07-28 ENCOUNTER — Ambulatory Visit (INDEPENDENT_AMBULATORY_CARE_PROVIDER_SITE_OTHER): Payer: No Payment, Other | Admitting: Clinical

## 2020-07-28 DIAGNOSIS — F319 Bipolar disorder, unspecified: Secondary | ICD-10-CM | POA: Diagnosis not present

## 2020-07-31 NOTE — Progress Notes (Signed)
   THERAPIST PROGRESS NOTE  Session Time: 25 minutes  Participation Level: Active  Behavioral Response: CasualAlertEuthymic  Type of Therapy: Individual Therapy  Treatment Goals addressed: Diagnosis: Depression  Interventions: CBT  Summary:  Mary Dodson is a 54 y.o. female who presents for the scheduled session oriented times five, appropriately dressed, and friendly. Client denied hallucinations and delusions. Client reported on today feeling good. Client reported since the last session she has been working on getting some of her physical needs met. Client reported she is working on getting a Pharmacist, community appointment. Client reported she is also strategizing making enough money to pay for alcohol classes for a previous charge while she was living in [place]. Client reported she also has gone to the doctor about her inability to sleep through the night and snoring. Client reported her doctor gave her a c- pap machine but she has not used it yet. Client reported she's struggled with insomnia for 25 years. Client reported she has been thinking about making healthier choices to help with her weight which would also help her sleep better.     Suicidal/Homicidal: Nowithout intent/plan  Therapist Response:  Therapist began the session by checking in and asking the client how she has been doing since the last session. Therapist actively listened to the client thoughts and feelings. Therapist used CBT to discuss how her healthcare practices affect how she has felt emotionally and physically in the past and how her choices are affecting her now. Therapist assigned the client homework to progressively make use of her C-Pap machine to help with her sleep and brainstorm small changes she can make with her eating and lifestyle choices. Therapist assisted with scheduling next appointments.    Plan: Return again in 4 weeks for individual therapy.  Diagnosis: Bipolar 1 disorder   Mary Jubilee Sloka Volante,  LCSW 07/31/2020

## 2020-08-09 ENCOUNTER — Telehealth: Payer: Self-pay | Admitting: *Deleted

## 2020-08-09 NOTE — Telephone Encounter (Signed)
Patient is calling to ask if her COVID card could be mailed she left it in the parking lot. Patient is requesting if her COVID card could be mailed. Please advise CB- [phone]

## 2020-08-09 NOTE — Telephone Encounter (Signed)
Called pt/ info given/ Stated will pass by this week and have it picked from [facility]

## 2020-08-11 MED FILL — DOXEPIN HCL 75 MG CAPS: 75 | 30 days supply | Qty: 30 | Fill #0

## 2020-08-11 MED FILL — NYSTATIN 100,000 UNIT/GM PO: 100000 | 7 days supply | Qty: 15 | Fill #0

## 2020-08-12 ENCOUNTER — Telehealth: Payer: Self-pay | Admitting: General Practice

## 2020-08-12 NOTE — Telephone Encounter (Signed)
I call the Pt since she drop-up an application for [facility], I inform her that she need to schedule a financial appt since she saw the provider on the Mobile unit, she said that she only goes to [facility] and that her primary care, that she does not want to apply for the [facility] financial program, I send the application via email to [facility] and I send the original documents back to the Pt

## 2020-08-17 ENCOUNTER — Telehealth: Payer: Self-pay | Admitting: *Deleted

## 2020-08-17 ENCOUNTER — Other Ambulatory Visit: Payer: Self-pay | Admitting: *Deleted

## 2020-08-17 NOTE — Telephone Encounter (Signed)
I already spoke with the Pt, since she submit just an application for the [facility] program only, when I spoke to her I inform her that she need to schedule an appt with financial dept. Since she need to fill out the [employer] application , she said that she only want the [facility] program card, that she is going to see the [facility] PCP only and she does not want to change PCP, I inform her that I will send a copy of her application to [facility] for them to process her request, I also spoke with Miguel Rota at [facility] and she was aware of the Pt. Then I send back the original documents to the Pt since I can't keep documents I can not used

## 2020-08-17 NOTE — Telephone Encounter (Signed)
Please f/u with patient.   Copied from CRM [id number]. Topic: Complaint - Provider (non-sensitive) >> Aug 16, 2020  1:17 PM Leafy Ro wrote: Date of Incident:Pt dropped off the financial paperwork around 08-11-2020 Details of complaint:Pt is calling and would like to speak Research officer, political party .How would the patient like to see it resolved? Pt would like to know why her application was mailed back to her . Pt is trying to get apply for orange card  Route to Research officer, political party.

## 2020-08-18 ENCOUNTER — Telehealth: Payer: Self-pay | Admitting: *Deleted

## 2020-08-18 NOTE — Telephone Encounter (Signed)
Copied from CRM #[id number]. Topic: Complaint - Provider (non-sensitive) °>> Aug 16, 2020  1:17 PM [name], Mary Dodson wrote: °Date of Incident:Pt dropped off the financial paperwork around 08-11-2020 Details of complaint:Pt is calling and would like to speak practice administrator .How would the patient like to see it resolved? Pt would like to know why her application was mailed back to her . Pt is trying to get apply for orange card ° °Route to Practice Administrator. °

## 2020-08-20 NOTE — Telephone Encounter (Signed)
Copied from CRM [id number]. Topic: Complaint - Provider (non-sensitive) >> Aug 16, 2020  1:17 PM Leafy Ro wrote: Date of Incident:Pt dropped off the financial paperwork around 08-11-2020 Details of complaint:Pt is calling and would like to speak Research officer, political party .How would the patient like to see it resolved? Pt would like to know why her application was mailed back to her . Pt is trying to get apply for orange card  Route to Research officer, political party.

## 2020-08-23 ENCOUNTER — Telehealth (HOSPITAL_COMMUNITY): Payer: Self-pay | Admitting: *Deleted

## 2020-08-23 NOTE — Telephone Encounter (Signed)
Call from patient stating she is taking her medicines, coloring, doing word searches and taking a walk daily but despite these measures she is having anxiety and depression daily. She states she recently discovered she feels a benefit from using CBD products and wanted to know my opinion on this. Will consult with the NP and call her back, however, NP out of the office until tomorrow.

## 2020-08-25 ENCOUNTER — Other Ambulatory Visit (HOSPITAL_COMMUNITY): Payer: Self-pay | Admitting: *Deleted

## 2020-08-25 MED ORDER — SERTRALINE HCL 100 MG PO TABS
100.0000 mg | ORAL_TABLET | Freq: Every day | ORAL | 1 refills | Status: DC
Start: 2020-08-25 — End: 2020-09-20

## 2020-08-31 LAB — GLUCOSE, POCT (MANUAL RESULT ENTRY): POC Glucose: 89 mg/dL (ref 70–99)

## 2020-09-14 ENCOUNTER — Ambulatory Visit: Payer: No Typology Code available for payment source

## 2020-09-15 MED FILL — DOXEPIN HCL 75 MG CAPS: 75 | 30 days supply | Qty: 30 | Fill #0

## 2020-09-20 ENCOUNTER — Encounter (HOSPITAL_COMMUNITY): Payer: Self-pay | Admitting: Psychiatry

## 2020-09-20 ENCOUNTER — Other Ambulatory Visit: Payer: Self-pay

## 2020-09-20 ENCOUNTER — Telehealth (INDEPENDENT_AMBULATORY_CARE_PROVIDER_SITE_OTHER): Payer: No Payment, Other | Admitting: Psychiatry

## 2020-09-20 ENCOUNTER — Ambulatory Visit (INDEPENDENT_AMBULATORY_CARE_PROVIDER_SITE_OTHER): Payer: No Payment, Other | Admitting: Clinical

## 2020-09-20 DIAGNOSIS — F319 Bipolar disorder, unspecified: Secondary | ICD-10-CM

## 2020-09-20 DIAGNOSIS — F324 Major depressive disorder, single episode, in partial remission: Secondary | ICD-10-CM

## 2020-09-20 MED ORDER — HYDROXYZINE HCL 10 MG PO TABS: 10.0000 mg | ORAL_TABLET | Freq: Three times a day (TID) | ORAL | 2 refills | Status: DC | PRN

## 2020-09-20 MED ORDER — BUPROPION HCL ER (SR) 150 MG PO TB12: 150.0000 mg | ORAL_TABLET | Freq: Two times a day (BID) | ORAL | 2 refills | Status: DC

## 2020-09-20 MED ORDER — BENZTROPINE MESYLATE 0.5 MG PO TABS: 0.5000 mg | ORAL_TABLET | Freq: Every day | ORAL | 2 refills | Status: DC

## 2020-09-20 MED ORDER — QUETIAPINE FUMARATE 400 MG PO TABS: 600.0000 mg | ORAL_TABLET | Freq: Every day | ORAL | 2 refills | Status: DC

## 2020-09-20 MED ORDER — RAMELTEON 8 MG PO TABS: 8.0000 mg | ORAL_TABLET | Freq: Every day | ORAL | 2 refills | Status: DC

## 2020-09-20 MED ORDER — SERTRALINE HCL 100 MG PO TABS: 100.0000 mg | ORAL_TABLET | Freq: Every day | ORAL | 1 refills | Status: DC

## 2020-09-20 MED ORDER — QUETIAPINE FUMARATE 100 MG PO TABS: 100.0000 mg | ORAL_TABLET | Freq: Two times a day (BID) | ORAL | 1 refills | Status: DC

## 2020-09-20 NOTE — Progress Notes (Signed)
BH MD/PA/NP OP Progress Note Virtual Visit via Telephone Note  I connected with Mary Dodson on 09/20/20 at  3:00 PM EST by telephone and verified that I am speaking with the correct person using two identifiers.  Location: Patient: home Provider: Clinic   I discussed the limitations, risks, security and privacy concerns of performing an evaluation and management service by telephone and the availability of in person appointments. I also discussed with the patient that there may be a patient responsible charge related to this service. The patient expressed understanding and agreed to proceed.   I provided 30 minutes of non-face-to-face time during this encounter.    09/20/2020 3:16 PM Mary Dodson  MRN:  [id number]  Chief Complaint:  "I feel like my medications are working. I feel more like myself"  HPI:  54 year old female seen today for followup psychiatric evaluation.   She has a history of alcohol use disorder (in remission), major depression, and bipolar 1disorder.  She is currently prescribed Zoloft 100 mg in the morning, Cogentin 0.5 mg daily, quetiapine 100 mg twice daily,  quetiapine 600 mg at bedtime, doxepin 75 mg (for itching) at bedtime as needed, and gabapentin 800 mg 4 times a day. She reports that she has her doxepin and gabapentin filled at the [facility]  Patient notes that her medications are effective in managing her psychiatric conditions.   Today patient is unable to login virtually so her exam was done over the phone. Today she notes that her mood has improved since last visit. On exam she denies anxiety and depression and notes that she feels more like herself.  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  She notes that she is trying to raise money so that she can go to [place] to visit her 2 daughters and her grandchild.  She notes that she is happy because her daughters except her in their lives even though she was not  present during their childhood.  She informed provider that recently she had a conversation with one of her daughters about why she did not fight for them.  She notes that she informed her daughters that she was unstable financially and was homeless.  She also noted that she suffered from substance use and did not want her mental health or substance use to affect them.  She informed provider that she is grateful that her daughters were so forgiving.  Today she denies SI/HI/AVH/paranoia.  Patient informed provider that she has been more forgetful lately.  She notes that later this month she will be [facility] at [facility] by neurology.  At times in the conversation patient was talkative and she ruminated on disliking Republicans.  She however was able to respond appropriately to provider when ask questions.  She also was less intrusive and allowed provider to discuss treatment.  No medication changes made today.  Patient agreeable to continue medications as prescribed.  She will follow up with provider in 3 months.  She will also follow-up with outpatient counseling for therapy.  No other concerns noted at this time.   Visit Diagnosis:    ICD-10-CM   1. Bipolar I disorder (HCC)  F31.9 benztropine (COGENTIN) 0.5 MG tablet    QUEtiapine (SEROQUEL) 100 MG tablet    QUEtiapine (SEROQUEL) 400 MG tablet    ramelteon (ROZEREM) 8 MG tablet  2. Major depressive disorder with single episode, in partial remission (HCC)  F32.4 buPROPion (WELLBUTRIN SR) 150 MG 12 hr tablet  hydrOXYzine (ATARAX/VISTARIL) 10 MG tablet    QUEtiapine (SEROQUEL) 100 MG tablet    QUEtiapine (SEROQUEL) 400 MG tablet    sertraline (ZOLOFT) 100 MG tablet    Past Psychiatric History:  alcohol use disorder (in remission), major depression, and bipolar 1disorder.  Past Medical History:  Past Medical History:  Diagnosis Date  . Alcohol abuse   . Bipolar disease, chronic (HCC)   . Hypothyroidism (acquired)   . PTSD (post-traumatic stress  disorder)    No past surgical history on file.  Family Psychiatric History:  Mother committed suicide in 43  Family History:  Family History  Problem Relation Age of Onset  . Cancer Mother        suicide  . Breast cancer Maternal Grandmother   . Cancer Maternal Grandmother   . Heart attack Father     Social History:  Social History   Socioeconomic History  . Marital status: Single    Spouse name: Not on file  . Number of children: 2  . Years of education: Not on file  . Highest education level: Bachelor's degree (e.g., BA, AB, BS)  Occupational History  . Occupation: unemployed  Tobacco Use  . Smoking status: Former Smoker    Packs/day: 1.00    Types: Cigarettes    Quit date: 04/30/2019    Years since quitting: 1.3  . Smokeless tobacco: Never Used  Vaping Use  . Vaping Use: Some days  Substance and Sexual Activity  . Alcohol use: Not Currently    Comment: recovering alcoholic  . Drug use: No  . Sexual activity: Not Currently    Birth control/protection: None  Other Topics Concern  . Not on file  Social History Narrative   ** Merged History Encounter **       ** Merged History Encounter **      Patient is right-handed. She lives alone in a one level home. She drinks 5 cups of coffee a week, and 6 cans of Diet-Pepsi a day. Patient states she uses 6-8 BC powders a day.        Social Determinants of Health   Financial Resource Strain: Not on file  Food Insecurity: Not on file  Transportation Needs: No Transportation Needs  . Lack of Transportation (Medical): No  . Lack of Transportation (Non-Medical): No  Physical Activity: Not on file  Stress: Not on file  Social Connections: Not on file    Allergies:  Allergies  Allergen Reactions  . Morphine And Related Other (See Comments)    "Makes me crazy"  . Trazodone And Nefazodone Other (See Comments)    Metabolic Disorder Labs: Lab Results  Component Value Date   HGBA1C 5.4 10/03/2016   MPG 108  10/03/2016   Lab Results  Component Value Date   PROLACTIN 21.6 10/03/2016   Lab Results  Component Value Date   CHOL 208 (H) 10/03/2016   TRIG 100 04/27/2019   HDL 128 10/03/2016   CHOLHDL 1.6 10/03/2016   VLDL 9 10/03/2016   LDLCALC 71 10/03/2016   Lab Results  Component Value Date   TSH 0.175 (L) 05/01/2020   TSH 0.210 (L) 04/21/2019    Therapeutic Level Labs: No results found for: LITHIUM No results found for: VALPROATE No components found for:  CBMZ  Current Medications: Current Outpatient Medications  Medication Sig Dispense Refill  . albuterol (VENTOLIN HFA) 108 (90 Base) MCG/ACT inhaler Inhale 2 puffs into the lungs 4 (four) times daily as needed for wheezing or shortness of  breath (cough).     . Amino Acids (AMINO ACID PO) Take 1 tablet by mouth daily.    . Aspirin-Salicylamide-Caffeine (BC HEADACHE PO) Take 6-7 packets by mouth daily as needed (headache).    . benztropine (COGENTIN) 0.5 MG tablet Take 1 tablet (0.5 mg total) by mouth at bedtime. 30 tablet 2  . buPROPion (WELLBUTRIN SR) 150 MG 12 hr tablet Take 1 tablet (150 mg total) by mouth 2 (two) times daily. 60 tablet 2  . calcium carbonate (OSCAL) 1500 (600 Ca) MG TABS tablet Take 600 mg of elemental calcium by mouth daily with breakfast.    . doxepin (SINEQUAN) 75 MG capsule Take 75 mg by mouth at bedtime as needed (sleep).     . ferrous sulfate 325 (65 FE) MG tablet Take 325 mg by mouth daily.    Marland Kitchen gabapentin (NEURONTIN) 800 MG tablet Take 800 mg by mouth 3 (three) times daily.    Marland Kitchen HYDROcodone-acetaminophen (NORCO/VICODIN) 5-325 MG tablet Take 1 tablet by mouth every 4 (four) hours as needed for moderate pain. (Patient not taking: Reported on 07/01/2020) 16 tablet 0  . hydrOXYzine (ATARAX/VISTARIL) 10 MG tablet Take 1 tablet (10 mg total) by mouth 3 (three) times daily as needed. 90 tablet 2  . levothyroxine (SYNTHROID) 75 MCG tablet Take 75 mcg by mouth daily.    . methocarbamol (ROBAXIN) 500 MG tablet Take  1 tablet (500 mg total) by mouth 4 (four) times daily. (Patient not taking: Reported on 07/01/2020) 16 tablet 0  . Multiple Vitamins-Minerals (MULTIVITAMIN WITH MINERALS) tablet Take 1 tablet by mouth daily.    Marland Kitchen nystatin (MYCOSTATIN/NYSTOP) powder Apply 1 application topically 3 (three) times daily. 15 g 0  . Omega-3 Fatty Acids (FISH OIL OMEGA-3 PO) Take 1 tablet by mouth daily.    Marland Kitchen omeprazole (PRILOSEC) 40 MG capsule Take 40 mg by mouth daily.  0  . polyethylene glycol (MIRALAX / GLYCOLAX) 17 g packet Take 17 g by mouth daily.    . promethazine (PHENERGAN) 25 MG tablet Take 25 mg by mouth daily as needed for nausea or vomiting.   0  . QUEtiapine (SEROQUEL) 100 MG tablet Take 1 tablet (100 mg total) by mouth 2 (two) times daily with breakfast and lunch. Also take 400 mg at bedtime 60 tablet 1  . QUEtiapine (SEROQUEL) 400 MG tablet Take 1.5 tablets (600 mg total) by mouth at bedtime. Also take 100 mg with breakfast and lunch 60 tablet 2  . ramelteon (ROZEREM) 8 MG tablet Take 1 tablet (8 mg total) by mouth at bedtime. 30 tablet 2  . sertraline (ZOLOFT) 100 MG tablet Take 1 tablet (100 mg total) by mouth daily after breakfast. 30 tablet 1   No current facility-administered medications for this visit.     Musculoskeletal: Strength & Muscle Tone: Unable to asses due to telephone visit. Patient unable to log in virtually Gait & Station: Unable to asses due to telephone visit. Patient unable to log in virtually Patient leans: N/A  Psychiatric Specialty Exam: Review of Systems  Last menstrual period 04/08/2017, unknown if currently breastfeeding.There is no height or weight on file to calculate BMI.  General Appearance: Unable to asses due to telephone visit. Patient unable to log in virtually  Eye Contact:  Unable to asses due to telephone visit. Patient unable to log in virtually  Speech:  Clear and Coherent and Pressured  Volume:  Normal  Mood:  Euthymic  Affect:  Appropriate and Congruent   Thought Process:  Coherent, Goal Directed and Linear  Orientation:  Full (Time, Place, and Person)  Thought Content: Logical and Rumination   Suicidal Thoughts:  No  Homicidal Thoughts:  No  Memory:  Immediate;   Fair Recent;   Fair Remote;   Fair  Judgement:  Fair  Insight:  Fair  Psychomotor Activity:  Normal  Concentration:  Concentration: Fair and Attention Span: Fair  Recall:  [employer] of Knowledge: Good  Language: Good  Akathisia:  No  Handed:  Right  AIMS (if indicated): Not done  Assets:  Communication Skills Desire for Improvement Financial Resources/Insurance Housing Social Support  ADL's:  Intact  Cognition: WNL  Sleep:  Good   Screenings: AIMS   Flowsheet Row [facility]  AIMS Total Score 0    AUDIT   Flowsheet Row [facility]  Alcohol Use Disorder Identification Test Final Score (AUDIT) 25    GAD-7   Flowsheet Row Office Visit from [facility]01/2020 in [facility] Counselor from 03/17/2020 in [facility]  Total GAD-7 Score 5 11    PHQ2-9   Flowsheet Row Office Visit from 07/01/2020 in [place] [facility] Counselor from 03/17/2020 in [facility]  PHQ-2 Total Score 1 4  PHQ-9 Total Score 5 12       Assessment and Plan: Today patient notes that her mood, anxiety, and depression has improved since last visit. No medication changes made today. She is agreeable to continue all medications as prescribed.     She will have her doxepin and gabapentin refilled at [facility] as her preferred pharmacy does not dispense the medication.   1. Bipolar I disorder (HCC)  Continue- benztropine (COGENTIN) 0.5 MG tablet; Take 1 tablet (0.5 mg total) by mouth at bedtime.  Dispense: 30 tablet; Refill: 2 Continue- QUEtiapine (SEROQUEL) 100 MG tablet; Take 1 tablet (100 mg  total) by mouth 2 (two) times daily with breakfast and lunch. Also take 400 mg at bedtime  Dispense: 60 tablet; Refill: 1 Continue- QUEtiapine (SEROQUEL) 400 MG tablet; Take 1.5 tablets (600 mg total) by mouth at bedtime. Also take 100 mg with breakfast and lunch  Dispense: 60 tablet; Refill: 2 Continue- ramelteon (ROZEREM) 8 MG tablet; Take 1 tablet (8 mg total) by mouth at bedtime.  Dispense: 30 tablet; Refill: 2  2. Major depressive disorder with single episode, in partial remission (HCC)  Continue- buPROPion (WELLBUTRIN SR) 150 MG 12 hr tablet; Take 1 tablet (150 mg total) by mouth 2 (two) times daily.  Dispense: 60 tablet; Refill: 2 Continue- hydrOXYzine (ATARAX/VISTARIL) 10 MG tablet; Take 1 tablet (10 mg total) by mouth 3 (three) times daily as needed.  Dispense: 90 tablet; Refill: 2 Continue- QUEtiapine (SEROQUEL) 100 MG tablet; Take 1 tablet (100 mg total) by mouth 2 (two) times daily with breakfast and lunch. Also take 400 mg at bedtime  Dispense: 60 tablet; Refill: 1 Continue- QUEtiapine (SEROQUEL) 400 MG tablet; Take 1.5 tablets (600 mg total) by mouth at bedtime. Also take 100 mg with breakfast and lunch  Dispense: 60 tablet; Refill: 2 Continue- sertraline (ZOLOFT) 100 MG tablet; Take 1 tablet (100 mg total) by mouth daily after breakfast.  Dispense: 30 tablet; Refill: 1    Follow-up in 3 months Follow-up with therapy    Shanna Cisco, NP 09/20/2020, 3:16 PM

## 2020-09-20 NOTE — Progress Notes (Signed)
   THERAPIST PROGRESS NOTE Virtual Visit via Video Note  I connected with Mary Dodson on 09/20/20 at  4:00 PM EST by a video enabled telemedicine application and verified that I am speaking with the correct person using two identifiers.  Location: Patient: home Provider: office   I discussed the limitations of evaluation and management by telemedicine and the availability of in person appointments. The patient expressed understanding and agreed to proceed.   Follow Up Instructions: I discussed the assessment and treatment plan with the patient. The patient was provided an opportunity to ask questions and all were answered. The patient agreed with the plan and demonstrated an understanding of the instructions.   The patient was advised to call back or seek an in-person evaluation if the symptoms worsen or if the condition fails to improve as anticipated.    Session Time: 17 minutes  Participation Level: Active  Behavioral Response: NAAlertEuthymic  Type of Therapy: Individual Therapy  Treatment Goals addressed: Coping  Interventions: CBT  Summary:  Mary Dodson is a 54 y.o. female who presents for the scheduled session oriented times five, appropriately dressed, and friendly. Client denied hallucinations and delusions. Client reported since the last session she has continued to do well. Client reported she met with the doctor on this date and continues to do well on the medication. Client denied anxiety and depressive symptoms. Client reported she has been attending [place] meetings and has decided she wants to work the 12 steps and get a sponsor. Client reported she met a lady that she would like to make her sponsor. Client reported she wants to tell her best friend that she does not want to use her as a sponsor and not the friend. Client reported although her friend is supportive of AA she has moments when she is short on patience and the client does not want someone like that as her  sponsor. Client reported she has also been implementing a new diet for herself to lose some weight and has currently lost five pounds.       Suicidal/Homicidal: Nowithout intent/plan  Therapist Response:  Therapist began the session checking in and asking the client how she has been since the last session. Therapist actively listened to the clients thoughts and feelings. Therapist used CBT to discuss the benefit of expanding social support by engaging in positive support groups. Therapist discussed with the client utilizing boundaries to prevent conflict of relationships. Client was scheduled for next appointment.    Plan: Return again in 4 weeks for individual therapy.  Diagnosis: Bipolar 1 disorder    Birdena Jubilee Ola Raap, LCSW 09/20/2020

## 2020-09-22 ENCOUNTER — Telehealth (HOSPITAL_COMMUNITY): Payer: No Payment, Other | Admitting: Psychiatry

## 2020-10-06 MED FILL — DOXEPIN HCL 75 MG CAPS: 75 | 30 days supply | Qty: 30 | Fill #0

## 2020-10-11 ENCOUNTER — Ambulatory Visit (INDEPENDENT_AMBULATORY_CARE_PROVIDER_SITE_OTHER): Payer: No Payment, Other | Admitting: Clinical

## 2020-10-11 ENCOUNTER — Other Ambulatory Visit: Payer: Self-pay

## 2020-10-11 DIAGNOSIS — F319 Bipolar disorder, unspecified: Secondary | ICD-10-CM

## 2020-10-13 NOTE — Progress Notes (Signed)
   THERAPIST PROGRESS NOTE Virtual Visit via Telephone Note  I connected with Mary Dodson on 10/13/20 at  4:00 PM EST by telephone and verified that I am speaking with the correct person using two identifiers.  Location: Patient: home Provider: office   I discussed the limitations, risks, security and privacy concerns of performing an evaluation and management service by telephone and the availability of in person appointments. I also discussed with the patient that there may be a patient responsible charge related to this service. The patient expressed understanding and agreed to proceed.  Follow Up Instructions: I discussed the assessment and treatment plan with the patient. The patient was provided an opportunity to ask questions and all were answered. The patient agreed with the plan and demonstrated an understanding of the instructions.   The patient was advised to call back or seek an in-person evaluation if the symptoms worsen or if the condition fails to improve as anticipated.    Session Time: 40 minutes  Participation Level: Active  Behavioral Response: CasualAlertEuthymic  Type of Therapy: Individual Therapy  Treatment Goals addressed: Anxiety  Interventions: CBT  Summary:  Mary Dodson is a 55 y.o. female who presents for the scheduled session oriented times five, appropriately dressed, and friendly. Client denied hallucinations and delusions. Client reported on today doing well. Client reported since the last session she has been working on her eating and exercise. Client reported she has lost a good amount of weight so far putting herself on a restrictive diet. 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 Client reported otherwise she has been experiencing heightened anxiety. Client reported having a panic attack in august 2012 but she has had a few in the  past two weeks where she had to call a friend to ask for help. Client reported it alarmed her because the panic attacked had stopped for a good period of time. Client reported she has been excited lately about the close bond her daughter is forming with her. Client reported her daughter asked her to stay with her during the month of February to help her set up the nursery. Client reported she has been somewhat worried about reducing the amount of money she usually gives her daughters and wondered if they would still appreciate the smaller amount she gives them. Client reported her hopes is that her daughters will be close with each other.    Suicidal/Homicidal: Nowithout intent/plan  Therapist Response:  Therapist began the session checking in and asking how she has been doing since the last appointment. Therapist actively listened to the clients thoughts and feelings. Therapist engaged with the client asking open ended questions about possible contributing factors that are affecting her current MH symptoms and discussing practical self care. Therapist used CBT to collaborate with the client to practice reversing the cycle of depressive thoughts by incorporating facts of her current situation to improve her feelings.     Plan: Return again in 9 weeks for individual therapy.  Diagnosis: Bipolar 1 disorder  Neena Rhymes Maddi Collar, LCSW 10/13/2020

## 2020-10-26 ENCOUNTER — Telehealth (HOSPITAL_COMMUNITY): Payer: Self-pay | Admitting: *Deleted

## 2020-10-26 ENCOUNTER — Other Ambulatory Visit (HOSPITAL_COMMUNITY): Payer: Self-pay | Admitting: Psychiatry

## 2020-10-26 DIAGNOSIS — F324 Major depressive disorder, single episode, in partial remission: Secondary | ICD-10-CM

## 2020-10-26 DIAGNOSIS — F319 Bipolar disorder, unspecified: Secondary | ICD-10-CM

## 2020-10-26 MED ORDER — BENZTROPINE MESYLATE 0.5 MG PO TABS: 0.5000 mg | ORAL_TABLET | Freq: Every day | ORAL | 2 refills | Status: DC

## 2020-10-26 MED ORDER — BUPROPION HCL ER (SR) 150 MG PO TB12: 150.0000 mg | ORAL_TABLET | Freq: Two times a day (BID) | ORAL | 2 refills | Status: DC

## 2020-10-26 MED ORDER — QUETIAPINE FUMARATE 400 MG PO TABS: 600.0000 mg | ORAL_TABLET | Freq: Every day | ORAL | 2 refills | Status: DC

## 2020-10-26 MED ORDER — QUETIAPINE FUMARATE 100 MG PO TABS: 100.0000 mg | ORAL_TABLET | Freq: Two times a day (BID) | ORAL | 1 refills | Status: DC

## 2020-10-26 MED ORDER — RAMELTEON 8 MG PO TABS: 8.0000 mg | ORAL_TABLET | Freq: Every day | ORAL | 2 refills | Status: DC

## 2020-10-26 MED ORDER — HYDROXYZINE HCL 10 MG PO TABS: 10.0000 mg | ORAL_TABLET | Freq: Three times a day (TID) | ORAL | 2 refills | Status: DC | PRN

## 2020-10-26 NOTE — Telephone Encounter (Signed)
Medications refilled and sent to preferred pharmacy.

## 2020-10-26 NOTE — Telephone Encounter (Signed)
Call from patient stating she is really frustrated and angry, primarily with [facility] who is the provider of her medicines. She is getting ready in a few days to fly to [place] for an extended time and needs 45 days of medicines to last for the time she is out of the [place]. Will reach out to Toy Cookey NP to see if she would Clinical research associate a 15 day rx to [facility] to cover her time away.

## 2020-10-29 MED FILL — DOXEPIN HCL 75 MG CAPS: 75 | 30 days supply | Qty: 30 | Fill #0

## 2020-11-23 ENCOUNTER — Ambulatory Visit: Payer: No Typology Code available for payment source | Admitting: Internal Medicine

## 2020-12-09 ENCOUNTER — Telehealth (HOSPITAL_COMMUNITY): Payer: Self-pay | Admitting: *Deleted

## 2020-12-09 NOTE — Telephone Encounter (Signed)
Thank you for calling in to rectify this situation.

## 2020-12-09 NOTE — Telephone Encounter (Signed)
VM left for writer re her Rozeram Rx and the application for assistance she has with [facility]. She states she hasnt had any Rozeram for one month or more because provider didn't indicate on the form for patient assistance she could have to 90 day renewals. Called [facility] at [phone] to clarify what Tyshay was asking for on the VM. Her application will expire in July, we are able to call in and give a verbal order for 90 pills with 1 refill taking her to July when she will have to due a new application. Will make Dr Doyne Keel aware writer called this in.

## 2020-12-10 ENCOUNTER — Other Ambulatory Visit (HOSPITAL_COMMUNITY): Payer: Self-pay | Admitting: Psychiatry

## 2020-12-10 ENCOUNTER — Telehealth (HOSPITAL_COMMUNITY): Payer: Self-pay | Admitting: *Deleted

## 2020-12-10 DIAGNOSIS — F324 Major depressive disorder, single episode, in partial remission: Secondary | ICD-10-CM

## 2020-12-10 MED ORDER — SERTRALINE HCL 100 MG PO TABS: 100.0000 mg | ORAL_TABLET | Freq: Every day | ORAL | 2 refills | Status: DC

## 2020-12-10 NOTE — Telephone Encounter (Signed)
Medication refilled and sent to preferred pharmacy

## 2020-12-10 NOTE — Telephone Encounter (Signed)
Request received from [facility] for refill on her Sertraline HCL 100 mg. Reviewed record and she will be out of her Sertraline on 12/23/20. Will notify Toy Cookey DNP.

## 2020-12-20 ENCOUNTER — Other Ambulatory Visit: Payer: Self-pay

## 2020-12-20 ENCOUNTER — Other Ambulatory Visit: Payer: Self-pay | Admitting: Psychiatry

## 2020-12-20 ENCOUNTER — Encounter (HOSPITAL_COMMUNITY): Payer: Self-pay | Admitting: Psychiatry

## 2020-12-20 ENCOUNTER — Telehealth (INDEPENDENT_AMBULATORY_CARE_PROVIDER_SITE_OTHER): Payer: No Payment, Other | Admitting: Psychiatry

## 2020-12-20 DIAGNOSIS — F324 Major depressive disorder, single episode, in partial remission: Secondary | ICD-10-CM

## 2020-12-20 DIAGNOSIS — F319 Bipolar disorder, unspecified: Secondary | ICD-10-CM | POA: Diagnosis not present

## 2020-12-20 MED ORDER — BUPROPION HCL ER (SR) 150 MG PO TB12: 150.0000 mg | ORAL_TABLET | Freq: Two times a day (BID) | ORAL | 2 refills | Status: DC

## 2020-12-20 MED ORDER — SERTRALINE HCL 100 MG PO TABS: 100.0000 mg | ORAL_TABLET | Freq: Every day | ORAL | 2 refills | Status: DC

## 2020-12-20 MED ORDER — HYDROXYZINE HCL 10 MG PO TABS: 10.0000 mg | ORAL_TABLET | Freq: Three times a day (TID) | ORAL | 2 refills | Status: DC | PRN

## 2020-12-20 MED ORDER — DOXEPIN HCL 75 MG PO CAPS: 75.0000 mg | ORAL_CAPSULE | Freq: Every evening | ORAL | 2 refills | Status: DC | PRN

## 2020-12-20 MED ORDER — RAMELTEON 8 MG PO TABS: 8.0000 mg | ORAL_TABLET | Freq: Every day | ORAL | 2 refills | Status: DC

## 2020-12-20 MED ORDER — QUETIAPINE FUMARATE 400 MG PO TABS: 600.0000 mg | ORAL_TABLET | Freq: Every day | ORAL | 2 refills | Status: DC

## 2020-12-20 MED ORDER — QUETIAPINE FUMARATE 100 MG PO TABS: 100.0000 mg | ORAL_TABLET | Freq: Two times a day (BID) | ORAL | 1 refills | Status: DC

## 2020-12-20 NOTE — Progress Notes (Signed)
BH MD/PA/NP OP Progress Note Virtual Visit via Telephone Note  I connected with [name] on 12/20/20 at  3:00 PM EDT by telephone and verified that I am speaking with the correct person using two identifiers.  Location: Patient: home Provider: Clinic   I discussed the limitations, risks, security and privacy concerns of performing an evaluation and management service by telephone and the availability of in person appointments. I also discussed with the patient that there may be a patient responsible charge related to this service. The patient expressed understanding and agreed to proceed.   I provided 30 minutes of non-face-to-face time during this encounter.    12/20/2020 3:40 PM Yaret Hush  MRN:  [id number]  Chief Complaint:  "I am feeling pretty good"  HPI:  55 year old female seen today for followup psychiatric evaluation.   She has a history of alcohol use disorder (in remission), major depression, and bipolar 1disorder.  She is currently prescribed Zoloft 100 mg in the morning, Cogentin 0.5 mg daily, quetiapine 100 mg twice daily,  quetiapine 600 mg at bedtime, doxepin 75 mg (for itching) at bedtime as needed, and gabapentin 800 mg 4 times a day (prescribed by her PCP). She reports that she has her doxepin and gabapentin filled at the [facility]).  Patient notes that her medications are effective in managing her psychiatric conditions.   Today patient is unable to login virtually so her exam was done over the phone. Today she informed writer that she has minimal anxiety and depression. Today provider conducted a GAD 7 and patient scored a 4. Provider also conducted a PHQ 9 and patient scored a 5. She notes that her mood is stable on her current does of Seroquel. Today she denies SI/HI/VAH, mania or paranoia.   She notes that visited [place] to visit her 2 daughters, her grandchild, and her sister.  She notes the visit went well but notes that she maybe  experiencing jet lag. She notes that she also has been having difficulty falling asleep but notes that she sleeps 6-8 hours nightly.   Patient notes that she went to [facility] to be seen by a neurologist. She notes that she was informed that overall she is doing well but report that they made adjustments to her thyroid medications.    No medication changes made today.  Patient agreeable to continue medications as prescribed.  She will follow up with provider in 3 months.  She will also follow-up with outpatient counseling for therapy.  No other concerns noted at this time.   Visit Diagnosis:    ICD-10-CM   1. Major depressive disorder with single episode, in partial remission (HCC)  F32.4 sertraline (ZOLOFT) 100 MG tablet    QUEtiapine (SEROQUEL) 400 MG tablet    QUEtiapine (SEROQUEL) 100 MG tablet    hydrOXYzine (ATARAX/VISTARIL) 10 MG tablet    buPROPion (WELLBUTRIN SR) 150 MG 12 hr tablet  2. Bipolar I disorder (HCC)  F31.9 ramelteon (ROZEREM) 8 MG tablet    QUEtiapine (SEROQUEL) 400 MG tablet    QUEtiapine (SEROQUEL) 100 MG tablet    Past Psychiatric History:  alcohol use disorder (in remission), major depression, and bipolar 1disorder.  Past Medical History:  Past Medical History:  Diagnosis Date  . Alcohol abuse   . Bipolar disease, chronic (HCC)   . Hypothyroidism (acquired)   . PTSD (post-traumatic stress disorder)    History reviewed. No pertinent surgical history.  Family Psychiatric History:  Mother committed suicide in 34  Family History:  Family History  Problem Relation Age of Onset  . Cancer Mother        suicide  . Breast cancer Maternal Grandmother   . Cancer Maternal Grandmother   . Heart attack Father     Social History:  Social History   Socioeconomic History  . Marital status: Single    Spouse name: Not on file  . Number of children: 2  . Years of education: Not on file  . Highest education level: Bachelor's degree (e.g., BA, AB, BS)  Occupational  History  . Occupation: unemployed  Tobacco Use  . Smoking status: Former Smoker    Packs/day: 1.00    Types: Cigarettes    Quit date: 04/30/2019    Years since quitting: 1.6  . Smokeless tobacco: Never Used  Vaping Use  . Vaping Use: Some days  Substance and Sexual Activity  . Alcohol use: Not Currently    Comment: recovering alcoholic  . Drug use: No  . Sexual activity: Not Currently    Birth control/protection: None  Other Topics Concern  . Not on file  Social History Narrative   ** Merged History Encounter **       ** Merged History Encounter **      Patient is right-handed. She lives alone in a one level home. She drinks 5 cups of coffee a week, and 6 cans of Diet-Pepsi a day. Patient states she uses 6-8 BC powders a day.        Social Determinants of Health   Financial Resource Strain: Not on file  Food Insecurity: Not on file  Transportation Needs: No Transportation Needs  . Lack of Transportation (Medical): No  . Lack of Transportation (Non-Medical): No  Physical Activity: Not on file  Stress: Not on file  Social Connections: Not on file    Allergies:  Allergies  Allergen Reactions  . Morphine And Related Other (See Comments)    "Makes me crazy"  . Trazodone And Nefazodone Other (See Comments)    Metabolic Disorder Labs: Lab Results  Component Value Date   HGBA1C 5.4 10/03/2016   MPG 108 10/03/2016   Lab Results  Component Value Date   PROLACTIN 21.6 10/03/2016   Lab Results  Component Value Date   CHOL 208 (H) 10/03/2016   TRIG 100 04/27/2019   HDL 128 10/03/2016   CHOLHDL 1.6 10/03/2016   VLDL 9 10/03/2016   LDLCALC 71 10/03/2016   Lab Results  Component Value Date   TSH 0.175 (L) 05/01/2020   TSH 0.210 (L) 04/21/2019    Therapeutic Level Labs: No results found for: LITHIUM No results found for: VALPROATE No components found for:  CBMZ  Current Medications: Current Outpatient Medications  Medication Sig Dispense Refill  .  albuterol (VENTOLIN HFA) 108 (90 Base) MCG/ACT inhaler Inhale 2 puffs into the lungs 4 (four) times daily as needed for wheezing or shortness of breath (cough).     . Amino Acids (AMINO ACID PO) Take 1 tablet by mouth daily.    . Aspirin-Salicylamide-Caffeine (BC HEADACHE PO) Take 6-7 packets by mouth daily as needed (headache).    . benztropine (COGENTIN) 0.5 MG tablet Take 1 tablet (0.5 mg total) by mouth at bedtime. 30 tablet 2  . buPROPion (WELLBUTRIN SR) 150 MG 12 hr tablet Take 1 tablet (150 mg total) by mouth 2 (two) times daily. 60 tablet 2  . calcium carbonate (OSCAL) 1500 (600 Ca) MG TABS tablet Take 600 mg of elemental calcium by  mouth daily with breakfast.    . doxepin (SINEQUAN) 75 MG capsule Take 1 capsule (75 mg total) by mouth at bedtime as needed (sleep). 30 capsule 2  . ferrous sulfate 325 (65 FE) MG tablet Take 325 mg by mouth daily.    Marland Kitchen. gabapentin (NEURONTIN) 800 MG tablet Take 800 mg by mouth 3 (three) times daily.    Marland Kitchen. HYDROcodone-acetaminophen (NORCO/VICODIN) 5-325 MG tablet Take 1 tablet by mouth every 4 (four) hours as needed for moderate pain. (Patient not taking: Reported on 07/01/2020) 16 tablet 0  . hydrOXYzine (ATARAX/VISTARIL) 10 MG tablet Take 1 tablet (10 mg total) by mouth 3 (three) times daily as needed. 90 tablet 2  . levothyroxine (SYNTHROID) 75 MCG tablet Take 75 mcg by mouth daily.    . methocarbamol (ROBAXIN) 500 MG tablet Take 1 tablet (500 mg total) by mouth 4 (four) times daily. (Patient not taking: Reported on 07/01/2020) 16 tablet 0  . Multiple Vitamins-Minerals (MULTIVITAMIN WITH MINERALS) tablet Take 1 tablet by mouth daily.    Marland Kitchen. nystatin (MYCOSTATIN/NYSTOP) powder Apply 1 application topically 3 (three) times daily. 15 g 0  . Omega-3 Fatty Acids (FISH OIL OMEGA-3 PO) Take 1 tablet by mouth daily.    Marland Kitchen. omeprazole (PRILOSEC) 40 MG capsule Take 40 mg by mouth daily.  0  . polyethylene glycol (MIRALAX / GLYCOLAX) 17 g packet Take 17 g by mouth daily.    .  promethazine (PHENERGAN) 25 MG tablet Take 25 mg by mouth daily as needed for nausea or vomiting.   0  . QUEtiapine (SEROQUEL) 100 MG tablet Take 1 tablet (100 mg total) by mouth 2 (two) times daily with breakfast and lunch. Also take 400 mg at bedtime 60 tablet 1  . QUEtiapine (SEROQUEL) 400 MG tablet Take 1.5 tablets (600 mg total) by mouth at bedtime. Also take 100 mg with breakfast and lunch 60 tablet 2  . ramelteon (ROZEREM) 8 MG tablet Take 1 tablet (8 mg total) by mouth at bedtime. 30 tablet 2  . sertraline (ZOLOFT) 100 MG tablet Take 1 tablet (100 mg total) by mouth daily after breakfast. 30 tablet 2   No current facility-administered medications for this visit.     Musculoskeletal: Strength & Muscle Tone: Unable to asses due to telephone visit. Gait & Station: Unable to asses due to telephone visit.  Patient leans: N/A  Psychiatric Specialty Exam: Review of Systems  Last menstrual period 04/08/2017, unknown if currently breastfeeding.There is no height or weight on file to calculate BMI.  General Appearance: Unable to asses due to telephone visit.  Eye Contact:  Unable to asses due to telephone   Speech:  Clear and Coherent and Normal Rate  Volume:  Normal  Mood:  Euthymic  Affect:  Appropriate and Congruent  Thought Process:  Coherent, Goal Directed and Linear  Orientation:  Full (Time, Place, and Person)  Thought Content: WDL, Logical and Rumination   Suicidal Thoughts:  No  Homicidal Thoughts:  No  Memory:  Immediate;   Good Recent;   Good Remote;   Good  Judgement:  Good  Insight:  Good  Psychomotor Activity:  Normal  Concentration:  Concentration: Good and Attention Span: Good  Recall:  Good  Fund of Knowledge: Good  Language: Good  Akathisia:  No  Handed:  Right  AIMS (if indicated): Not done  Assets:  Communication Skills Desire for Improvement Financial Resources/Insurance Housing Social Support  ADL's:  Intact  Cognition: WNL  Sleep:  Good  Screenings: AIMS   Flowsheet Row [facility]  AIMS Total Score 0    AUDIT   Flowsheet Row [facility]  Alcohol Use Disorder Identification Test Final Score (AUDIT) 25    GAD-7   Flowsheet Row Video Visit from 12/20/2020 in [facility] Office Visit from 07/01/2020 in [facility] Counselor from 03/17/2020 in [facility]  Total GAD-7 Score 4 5 11     PHQ2-9   Flowsheet Row Video Visit from 12/20/2020 in [facility] Office Visit from 07/01/2020 in [facility] Counselor from 03/17/2020 in [facility]  PHQ-2 Total Score 0 1 4  PHQ-9 Total Score 5 5 12     Flowsheet Row Video Visit from 12/20/2020 in [facility]  C-SSRS RISK CATEGORY No Risk       Assessment and Plan: Today patient notes that her mood, anxiety, and depression has improved since last visit. No medication changes made today. She is agreeable to continue all medications as prescribed.      1. Bipolar I disorder (HCC)  Continue- benztropine (COGENTIN) 0.5 MG tablet; Take 1 tablet (0.5 mg total) by mouth at bedtime.  Dispense: 30 tablet; Refill: 2 Continue- QUEtiapine (SEROQUEL) 100 MG tablet; Take 1 tablet (100 mg total) by mouth 2 (two) times daily with breakfast and lunch. Also take 400 mg at bedtime  Dispense: 60 tablet; Refill: 1 Continue- QUEtiapine (SEROQUEL) 400 MG tablet; Take 1.5 tablets (600 mg total) by mouth at bedtime. Also take 100 mg with breakfast and lunch  Dispense: 60 tablet; Refill: 2 Continue- ramelteon (ROZEREM) 8 MG tablet; Take 1 tablet (8 mg total) by mouth at bedtime.  Dispense: 30 tablet; Refill: 2  2. Major depressive disorder with single episode, in partial remission (HCC)  Continue- buPROPion  (WELLBUTRIN SR) 150 MG 12 hr tablet; Take 1 tablet (150 mg total) by mouth 2 (two) times daily.  Dispense: 60 tablet; Refill: 2 Continue- hydrOXYzine (ATARAX/VISTARIL) 10 MG tablet; Take 1 tablet (10 mg total) by mouth 3 (three) times daily as needed.  Dispense: 90 tablet; Refill: 2 Continue- QUEtiapine (SEROQUEL) 100 MG tablet; Take 1 tablet (100 mg total) by mouth 2 (two) times daily with breakfast and lunch. Also take 400 mg at bedtime  Dispense: 60 tablet; Refill: 1 Continue- QUEtiapine (SEROQUEL) 400 MG tablet; Take 1.5 tablets (600 mg total) by mouth at bedtime. Also take 100 mg with breakfast and lunch  Dispense: 60 tablet; Refill: 2 Continue- sertraline (ZOLOFT) 100 MG tablet; Take 1 tablet (100 mg total) by mouth daily after breakfast.  Dispense: 30 tablet; Refill: 1    Follow-up in 3 months Follow-up with therapy    12/22/2020, NP 12/20/2020, 3:40 PM

## 2020-12-29 ENCOUNTER — Other Ambulatory Visit: Payer: Self-pay | Admitting: *Deleted

## 2021-01-25 ENCOUNTER — Telehealth (HOSPITAL_COMMUNITY): Payer: Self-pay | Admitting: *Deleted

## 2021-01-25 ENCOUNTER — Other Ambulatory Visit: Payer: Self-pay

## 2021-01-25 MED ORDER — DOXEPIN HCL 75 MG PO CAPS
ORAL_CAPSULE | ORAL | 0 refills | Status: DC
  Filled 2021-01-25: qty 30, 30d supply, fill #0
  Filled 2021-01-28: qty 15, 15d supply, fill #1

## 2021-01-25 NOTE — Telephone Encounter (Signed)
Call from patient stating she will be leaving this Saturday for [place] to visit family, she has a new grandson. She uses [facility], mail order [facility] service for her meds. She got 45 days of medicine from her PCP to cover her for the length of time she will be out of town and [facility] told her if she could get her other medicines, the ones from [place] for 45 days as well they would do it, otherwise they would only issue 30. She is asking Dr Doyne Keel for a 45 day supply for this one time of her meds so she can go out of [place]. Will bring this to the provider and follow up.

## 2021-01-26 ENCOUNTER — Other Ambulatory Visit (HOSPITAL_COMMUNITY): Payer: Self-pay | Admitting: Psychiatry

## 2021-01-26 DIAGNOSIS — F324 Major depressive disorder, single episode, in partial remission: Secondary | ICD-10-CM

## 2021-01-26 DIAGNOSIS — F319 Bipolar disorder, unspecified: Secondary | ICD-10-CM

## 2021-01-26 MED ORDER — QUETIAPINE FUMARATE 100 MG PO TABS: 100.0000 mg | ORAL_TABLET | Freq: Two times a day (BID) | ORAL | 1 refills | Status: DC

## 2021-01-26 MED ORDER — SERTRALINE HCL 100 MG PO TABS: 100.0000 mg | ORAL_TABLET | Freq: Every day | ORAL | 2 refills | Status: DC

## 2021-01-26 MED ORDER — BENZTROPINE MESYLATE 0.5 MG PO TABS: 0.5000 mg | ORAL_TABLET | Freq: Every day | ORAL | 2 refills | Status: DC

## 2021-01-26 MED ORDER — RAMELTEON 8 MG PO TABS: 8.0000 mg | ORAL_TABLET | Freq: Every day | ORAL | 2 refills | Status: DC

## 2021-01-26 MED ORDER — HYDROXYZINE HCL 10 MG PO TABS: 10.0000 mg | ORAL_TABLET | Freq: Three times a day (TID) | ORAL | 2 refills | Status: DC | PRN

## 2021-01-26 MED ORDER — BUPROPION HCL ER (SR) 150 MG PO TB12: 150.0000 mg | ORAL_TABLET | Freq: Two times a day (BID) | ORAL | 2 refills | Status: DC

## 2021-01-26 MED ORDER — QUETIAPINE FUMARATE 400 MG PO TABS: 600.0000 mg | ORAL_TABLET | Freq: Every day | ORAL | 2 refills | Status: DC

## 2021-01-26 NOTE — Telephone Encounter (Signed)
Called patient and left message for her that meds had been called in

## 2021-01-26 NOTE — Telephone Encounter (Signed)
Medication refilled and sent to preferred pharmacy

## 2021-01-28 ENCOUNTER — Other Ambulatory Visit: Payer: Self-pay

## 2021-02-01 ENCOUNTER — Other Ambulatory Visit: Payer: Self-pay

## 2021-02-08 ENCOUNTER — Other Ambulatory Visit: Payer: Self-pay

## 2021-03-16 ENCOUNTER — Other Ambulatory Visit: Payer: Self-pay

## 2021-03-16 MED ORDER — DOXEPIN HCL 75 MG PO CAPS
ORAL_CAPSULE | ORAL | 0 refills | Status: DC
  Filled 2021-03-16: qty 30, 30d supply, fill #0

## 2021-03-17 ENCOUNTER — Other Ambulatory Visit: Payer: Self-pay

## 2021-03-17 ENCOUNTER — Telehealth (HOSPITAL_COMMUNITY): Payer: No Payment, Other | Admitting: Psychiatry

## 2021-03-18 ENCOUNTER — Telehealth (HOSPITAL_COMMUNITY): Payer: Self-pay | Admitting: Psychiatry

## 2021-03-18 NOTE — Telephone Encounter (Signed)
SEROQUEL 100 MG per pt is the medication that has not been sent to [facility]. Please fax to [facility]

## 2021-03-21 ENCOUNTER — Other Ambulatory Visit: Payer: Self-pay | Admitting: *Deleted

## 2021-03-23 ENCOUNTER — Other Ambulatory Visit: Payer: Self-pay

## 2021-03-23 ENCOUNTER — Other Ambulatory Visit (HOSPITAL_COMMUNITY): Payer: Self-pay | Admitting: Psychiatry

## 2021-03-23 DIAGNOSIS — F324 Major depressive disorder, single episode, in partial remission: Secondary | ICD-10-CM

## 2021-03-23 DIAGNOSIS — F319 Bipolar disorder, unspecified: Secondary | ICD-10-CM

## 2021-03-23 MED ORDER — QUETIAPINE FUMARATE 100 MG PO TABS: 100.0000 mg | ORAL_TABLET | Freq: Two times a day (BID) | ORAL | 2 refills | Status: DC

## 2021-03-23 MED ORDER — QUETIAPINE FUMARATE 400 MG PO TABS: 600.0000 mg | ORAL_TABLET | Freq: Every day | ORAL | 2 refills | Status: DC

## 2021-03-30 ENCOUNTER — Other Ambulatory Visit: Payer: Self-pay | Admitting: Obstetrics and Gynecology

## 2021-03-30 DIAGNOSIS — Z1231 Encounter for screening mammogram for malignant neoplasm of breast: Secondary | ICD-10-CM

## 2021-04-13 ENCOUNTER — Other Ambulatory Visit: Payer: Self-pay

## 2021-04-13 MED ORDER — DOXEPIN HCL 75 MG PO CAPS
ORAL_CAPSULE | ORAL | 0 refills | Status: DC
  Filled 2021-04-13 – 2021-06-16 (×2): qty 30, 30d supply, fill #0

## 2021-04-20 ENCOUNTER — Other Ambulatory Visit: Payer: Self-pay

## 2021-04-25 ENCOUNTER — Telehealth (HOSPITAL_COMMUNITY): Payer: No Payment, Other | Admitting: Psychiatry

## 2021-05-10 ENCOUNTER — Ambulatory Visit (INDEPENDENT_AMBULATORY_CARE_PROVIDER_SITE_OTHER): Payer: No Payment, Other | Admitting: Clinical

## 2021-05-10 ENCOUNTER — Other Ambulatory Visit: Payer: Self-pay

## 2021-05-10 DIAGNOSIS — F319 Bipolar disorder, unspecified: Secondary | ICD-10-CM

## 2021-05-12 ENCOUNTER — Other Ambulatory Visit: Payer: Self-pay

## 2021-05-12 ENCOUNTER — Ambulatory Visit: Payer: Self-pay | Admitting: *Deleted

## 2021-05-12 ENCOUNTER — Ambulatory Visit
Admission: RE | Admit: 2021-05-12 | Discharge: 2021-05-12 | Disposition: A | Discharge status: Home / Self Care | Payer: No Typology Code available for payment source | Source: Ambulatory Visit | Attending: Obstetrics and Gynecology | Admitting: Obstetrics and Gynecology

## 2021-05-12 VITALS — BP 120/72 | Wt 193.2 lb

## 2021-05-12 DIAGNOSIS — Z1231 Encounter for screening mammogram for malignant neoplasm of breast: Secondary | ICD-10-CM

## 2021-05-12 DIAGNOSIS — Z1239 Encounter for other screening for malignant neoplasm of breast: Secondary | ICD-10-CM

## 2021-05-12 NOTE — Patient Instructions (Addendum)
Explained breast self awareness with Mary Dodson. Patient did not need a Pap smear today due to last Pap smear and HPV typing was 04/13/2020. Let patient know that based on her history that her next Pap smear is due in three years. Referred patient to the [facility] for a screening mammogram on the mobile unit. Appointment scheduled Thursday, May 12, 2021 at 1530. Patient escorted to the mobile unit following [facility] appointment for her screening mammogram. Let patient know the Breast Center will follow up with her within the next couple weeks with results of her mammogram by letter or phone. Referred patient to the free lung cancer screening. Let patient know that that nurse from [facility] will call her with her Shared Decision making visit appointment. Mary Dodson verbalized understanding.  Mary Dodson, Mary Maser, RN 3:41 PM

## 2021-05-12 NOTE — Progress Notes (Signed)
Mary Dodson is a 55 y.o. female who presents to [facility] clinic today with no complaints.    Pap Smear: Pap smear not completed today. Last Pap smear 04/13/2020 at [facility] clinic and was normal with negative HPV. Patient has history of multiple abnormal Pap smears 03/07/2018 that was ASCUS with negative HPV, 08/01/2017 at the [facility] and [facility]. Per patient she has had three other abnormal Pap smears when she was 22, 36, and 45. Patient states she thinks she had a colposcopy to follow-up after the abnormal Pap smear when she was 45. Last Pap smear was available in Epic.   Physical exam: Breasts Breasts symmetrical. No skin abnormalities bilateral breasts. No nipple retraction bilateral breasts. No nipple discharge bilateral breasts. No lymphadenopathy. No lumps palpated bilateral breasts. No complaints of pain or tenderness on exam.  MS DIGITAL SCREENING TOMO BILATERAL  Result Date: 04/15/2020 CLINICAL DATA:  Screening. EXAM: DIGITAL SCREENING BILATERAL MAMMOGRAM WITH TOMO AND CAD COMPARISON:  Previous exam(s). ACR Breast Density Category b: There are scattered areas of fibroglandular density. FINDINGS: There are no findings suspicious for malignancy. Images were processed with CAD. IMPRESSION: No mammographic evidence of malignancy. A result letter of this screening mammogram will be mailed directly to the patient. RECOMMENDATION: Screening mammogram in one year. (Code:SM-B-01Y) BI-RADS CATEGORY  1: Negative. Electronically Signed   By: Gerome Sam III M.D   On: 04/15/2020 12:31   MS DIGITAL SCREENING TOMO BILATERAL  Result Date: 04/07/2019 CLINICAL DATA:  Screening. EXAM: DIGITAL SCREENING BILATERAL MAMMOGRAM WITH TOMO AND CAD COMPARISON:  Previous exam(s). ACR Breast Density Category b: There are scattered areas of fibroglandular density. FINDINGS: There are no findings suspicious for malignancy. Images were processed with CAD. IMPRESSION: No mammographic evidence of malignancy. A result letter of this  screening mammogram will be mailed directly to the patient. RECOMMENDATION: Screening mammogram in one year. (Code:SM-B-01Y) BI-RADS CATEGORY  1: Negative. Electronically Signed   By: Sande Brothers M.D.   On: 04/07/2019 13:13   MS DIGITAL SCREENING TOMO BILATERAL  Result Date: 03/07/2018 CLINICAL DATA:  Screening. EXAM: DIGITAL SCREENING BILATERAL MAMMOGRAM WITH TOMO AND CAD COMPARISON:  Previous exam(s). ACR Breast Density Category b: There are scattered areas of fibroglandular density. FINDINGS: There are no findings suspicious for malignancy. Images were processed with CAD. IMPRESSION: No mammographic evidence of malignancy. A result letter of this screening mammogram will be mailed directly to the patient. RECOMMENDATION: Screening mammogram in one year. (Code:SM-B-01Y) BI-RADS CATEGORY  1: Negative. Electronically Signed   By: Britta Mccreedy M.D.   On: 03/07/2018 15:42         Pelvic/Bimanual Pap is not indicated today per BCCCP guidelines.   Smoking History: Patient is a former smoker that quit 04/30/2019.   Patient Navigation: Patient education provided. Access to services provided for patient through [facility] program.   Colorectal Cancer Screening: Per patient has a history of having a colonoscopy completed in May 2018 in [place]. No complaints today.    Breast and Cervical Cancer Risk Assessment: Patient has a family history of her maternal grandmother having breast cancer. Patient has no known genetic mutations or history of  radiation treatment to the chest before age 47. Per patient has a history of cervical dysplasia. Patient has no history of being immunocompromised, or DES exposure in-utero.  Risk Assessment     Risk Scores       05/12/2021 04/13/2020   Last edited by: Narda Rutherford, LPN Meryl Dare, CMA   5-year risk:  2.3 % 2.2 %   Lifetime risk: 16.2 % 16.5 %            A: BCCCP exam without pap smear No complaints.  P: Referred patient to the  [facility] for a screening mammogram on the mobile unit. Appointment scheduled Thursday, May 12, 2021 at 1530.  Priscille Heidelberg, RN 05/12/2021 3:41 PM
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
## 2021-05-25 ENCOUNTER — Telehealth (HOSPITAL_COMMUNITY): Payer: Self-pay | Admitting: *Deleted

## 2021-05-25 ENCOUNTER — Other Ambulatory Visit (HOSPITAL_COMMUNITY): Payer: Self-pay | Admitting: Psychiatry

## 2021-05-25 DIAGNOSIS — F324 Major depressive disorder, single episode, in partial remission: Secondary | ICD-10-CM

## 2021-05-25 DIAGNOSIS — F319 Bipolar disorder, unspecified: Secondary | ICD-10-CM

## 2021-05-25 MED ORDER — QUETIAPINE FUMARATE 100 MG PO TABS: 100.0000 mg | ORAL_TABLET | Freq: Two times a day (BID) | ORAL | 2 refills | Status: DC

## 2021-05-25 MED ORDER — QUETIAPINE FUMARATE 300 MG PO TABS: 600.0000 mg | ORAL_TABLET | Freq: Every day | ORAL | 2 refills | Status: DC

## 2021-05-25 NOTE — Telephone Encounter (Signed)
Provider spoke to pharmacist at [facility] who notes that they wanted to know patient's dose of Seroquel.  Provider informed pharmacist that patient was taking Seroquel 100 mg twice in a day (totaling 200 mg), and Seroquel 400 mg nightly with addition of half of the 400  (200 mg)mg tablet (totaling 600 mg nightly).  Pharmacist asked if  Seroquel 300 mg can be filled twice at night totaling 600 mg.  Provider was agreeable to this.  Provider called patient to notify of the change however patient did not pick up.  Writer left a message with patient and instructed her to call if she has other questions.  No other concerns noted at this time

## 2021-05-25 NOTE — Telephone Encounter (Signed)
Opened a second time in error. 

## 2021-05-25 NOTE — Telephone Encounter (Signed)
Fax request from [facility] re patients dose of Quetiapine. MAR is unclear and pharmacy would like a new rx e signed to them or call in clarification, The 100 mg rx says take 100 mg BID with 400 mg at HS, but the 400 mg rx is really 1.5 pills equaling 600 mg.Will ask provider to call in a clarification.

## 2021-06-16 ENCOUNTER — Other Ambulatory Visit: Payer: Self-pay

## 2021-06-20 ENCOUNTER — Other Ambulatory Visit: Payer: Self-pay

## 2021-06-21 ENCOUNTER — Telehealth (INDEPENDENT_AMBULATORY_CARE_PROVIDER_SITE_OTHER): Payer: No Payment, Other | Admitting: Psychiatry

## 2021-06-21 ENCOUNTER — Encounter (HOSPITAL_COMMUNITY): Payer: Self-pay | Admitting: Psychiatry

## 2021-06-21 DIAGNOSIS — F324 Major depressive disorder, single episode, in partial remission: Secondary | ICD-10-CM

## 2021-06-21 DIAGNOSIS — F319 Bipolar disorder, unspecified: Secondary | ICD-10-CM | POA: Diagnosis not present

## 2021-06-21 MED ORDER — QUETIAPINE FUMARATE 300 MG PO TABS: 600.0000 mg | ORAL_TABLET | Freq: Every day | ORAL | 3 refills | Status: DC

## 2021-06-21 MED ORDER — BUPROPION HCL ER (SR) 150 MG PO TB12: 150.0000 mg | ORAL_TABLET | Freq: Two times a day (BID) | ORAL | 3 refills | Status: DC

## 2021-06-21 MED ORDER — BENZTROPINE MESYLATE 0.5 MG PO TABS: 0.5000 mg | ORAL_TABLET | Freq: Every day | ORAL | 3 refills | Status: DC

## 2021-06-21 MED ORDER — HYDROXYZINE HCL 10 MG PO TABS: 10.0000 mg | ORAL_TABLET | Freq: Three times a day (TID) | ORAL | 3 refills | Status: DC | PRN

## 2021-06-21 NOTE — Progress Notes (Signed)
BH MD/PA/NP OP Progress Note Virtual Visit via Video Note  I connected with Mary Dodson on 06/21/21 at  4:00 PM EDT by a video enabled telemedicine application and verified that I am speaking with the correct person using two identifiers.  Location: Patient: Home Provider: Clinic   I discussed the limitations of evaluation and management by telemedicine and the availability of in person appointments. The patient expressed understanding and agreed to proceed.  I provided 30 minutes of non-face-to-face time during this encounter.     06/21/2021 4:10 PM Fujiko Picazo  MRN:  [id number]  Chief Complaint:  "I have been really confused today"  HPI:  55 year old female seen today for followup psychiatric evaluation.   She has a history of alcohol use disorder (in remission), major depression, and bipolar 1 disorder.  She is currently prescribed Zoloft 100 mg in the morning, Cogentin 0.5 mg daily, quetiapine 100 mg twice daily,  quetiapine 600 mg at bedtime, doxepin 75 mg (for itching) at bedtime as needed (prescribed by PCP), and gabapentin 800 mg 4 times a day (prescribed by her PCP). She reports that she has her doxepin and gabapentin filled at the [facility]).  Patient notes that her medications are effective in managing her psychiatric conditions.   Today patient is pleasant, cooperative, engaged in conversation, and maintains eye contact.  She informed Clinical research associate that for the last few days she has been feeling confused.  She notes that when she saw her neurologist at [facility] they informed her that overall she was doing well and reported that they adjusted her thyroid medications.  She informed Clinical research associate that this was effective for a while however notes that she now has been more confused.  She informed Clinical research associate that she has a CPAP machine however is not utilizing it.  Provider informed patient that lack of oxygen can increase confusion and recommended that she use her CPAP machine more  often.  She endorsed understanding and agreed.  She informed Clinical research associate that she sleeps approximately 6.5-7.5 hours nightly.  She however notes that she goes to bed late at night and does not feel rested.  She notes that Seroquel is not as effective as it once was and managing her sleep.  She also notes that she is not taking doxepin in a while however reports that she will restart it tonight.   Patient notes that her anxiety and her depression are manageable.  Provider conducted a GAD-7 and patient scored a 8, at her last visit she scored a 4.  Provider also conducted a a  PHQ 9 and patient scored a 14, at her last visit she scored a 5. She endorses increased appetite however notes that she has been maintaining her weight.  Today she denies SI/HI/VAH, mania or paranoia.   No medication changes made today.  Patient agreeable to continue medications as prescribed. Patient reports that she does not need Rozerem filled. She will follow up with provider in 3 months.  She will also follow-up with outpatient counseling for therapy.  No other concerns noted at this time.   Visit Diagnosis:    ICD-10-CM   1. Bipolar I disorder (HCC)  F31.9 benztropine (COGENTIN) 0.5 MG tablet    QUEtiapine (SEROQUEL) 300 MG tablet    2. Major depressive disorder with single episode, in partial remission (HCC)  F32.4 buPROPion (WELLBUTRIN SR) 150 MG 12 hr tablet    hydrOXYzine (ATARAX/VISTARIL) 10 MG tablet    QUEtiapine (SEROQUEL) 300 MG tablet  Past Psychiatric History:  alcohol use disorder (in remission), major depression, and bipolar 1disorder.  Past Medical History:  Past Medical History:  Diagnosis Date   Alcohol abuse    Bipolar disease, chronic (HCC)    Hypothyroidism (acquired)    PTSD (post-traumatic stress disorder)    History reviewed. No pertinent surgical history.  Family Psychiatric History:  Mother committed suicide in 1  Family History:  Family History  Problem Relation Age of Onset    Cancer Mother        suicide   Breast cancer Maternal Grandmother    Cancer Maternal Grandmother    Heart attack Father     Social History:  Social History   Socioeconomic History   Marital status: Single    Spouse name: Not on file   Number of children: 2   Years of education: Not on file   Highest education level: Bachelor's degree (e.g., BA, AB, BS)  Occupational History   Occupation: unemployed  Tobacco Use   Smoking status: Former    Packs/day: 1.00    Types: Cigarettes    Quit date: 04/30/2019    Years since quitting: 2.1   Smokeless tobacco: Never  Vaping Use   Vaping Use: Some days  Substance and Sexual Activity   Alcohol use: Not Currently    Comment: recovering alcoholic   Drug use: No   Sexual activity: Not Currently    Birth control/protection: None  Other Topics Concern   Not on file  Social History Narrative   ** Merged History Encounter **       ** Merged History Encounter **      Patient is right-handed. She lives alone in a one level home. She drinks 5 cups of coffee a week, and 6 cans of Diet-Pepsi a day. Patient states she uses 6-8 BC powders a day.        Social Determinants of Health   Financial Resource Strain: Not on file  Food Insecurity: Not on file  Transportation Needs: No Transportation Needs   Lack of Transportation (Medical): No   Lack of Transportation (Non-Medical): No  Physical Activity: Not on file  Stress: Not on file  Social Connections: Not on file    Allergies:  Allergies  Allergen Reactions   Morphine And Related Other (See Comments)    "Makes me crazy"   Trazodone And Nefazodone Other (See Comments)    Metabolic Disorder Labs: Lab Results  Component Value Date   HGBA1C 5.4 10/03/2016   MPG 108 10/03/2016   Lab Results  Component Value Date   PROLACTIN 21.6 10/03/2016   Lab Results  Component Value Date   CHOL 208 (H) 10/03/2016   TRIG 100 04/27/2019   HDL 128 10/03/2016   CHOLHDL 1.6 10/03/2016    VLDL 9 10/03/2016   LDLCALC 71 10/03/2016   Lab Results  Component Value Date   TSH 0.175 (L) 05/01/2020   TSH 0.210 (L) 04/21/2019    Therapeutic Level Labs: No results found for: LITHIUM No results found for: VALPROATE No components found for:  CBMZ  Current Medications: Current Outpatient Medications  Medication Sig Dispense Refill   albuterol (VENTOLIN HFA) 108 (90 Base) MCG/ACT inhaler Inhale 2 puffs into the lungs 4 (four) times daily as needed for wheezing or shortness of breath (cough).      Amino Acids (AMINO ACID PO) Take 1 tablet by mouth daily.     Aspirin-Salicylamide-Caffeine (BC HEADACHE PO) Take 6-7 packets by mouth daily as needed (  headache).     benztropine (COGENTIN) 0.5 MG tablet Take 1 tablet (0.5 mg total) by mouth at bedtime. 45 tablet 3   buPROPion (WELLBUTRIN SR) 150 MG 12 hr tablet Take 1 tablet (150 mg total) by mouth 2 (two) times daily. 75 tablet 3   calcium carbonate (OSCAL) 1500 (600 Ca) MG TABS tablet Take 600 mg of elemental calcium by mouth daily with breakfast.     doxepin (SINEQUAN) 75 MG capsule Take 1 capsule by mouth once a day as needed 45 capsule 0   doxepin (SINEQUAN) 75 MG capsule Take 1 capsule by mouth once a day as needed 30 capsule 0   doxepin (SINEQUAN) 75 MG capsule Take 1 capsule by mouth once a day as needed 30 capsule 0   ferrous sulfate 325 (65 FE) MG tablet Take 325 mg by mouth daily.     gabapentin (NEURONTIN) 800 MG tablet Take 800 mg by mouth 3 (three) times daily.     HYDROcodone-acetaminophen (NORCO/VICODIN) 5-325 MG tablet Take 1 tablet by mouth every 4 (four) hours as needed for moderate pain. (Patient not taking: Reported on 07/01/2020) 16 tablet 0   hydrOXYzine (ATARAX/VISTARIL) 10 MG tablet Take 1 tablet (10 mg total) by mouth 3 (three) times daily as needed. Patient will be traveling and needs a 45 day supply 120 tablet 3   levothyroxine (SYNTHROID) 75 MCG tablet Take 75 mcg by mouth daily.     methocarbamol (ROBAXIN) 500  MG tablet Take 1 tablet (500 mg total) by mouth 4 (four) times daily. (Patient not taking: Reported on 07/01/2020) 16 tablet 0   Multiple Vitamins-Minerals (MULTIVITAMIN WITH MINERALS) tablet Take 1 tablet by mouth daily.     nystatin (MYCOSTATIN/NYSTOP) powder Apply 1 application topically 3 (three) times daily. 15 g 0   nystatin (MYCOSTATIN/NYSTOP) powder APPLY 1 APPLICATION TOPICALLY 3 (THREE) TIMES DAILY. 15 g 0   Omega-3 Fatty Acids (FISH OIL OMEGA-3 PO) Take 1 tablet by mouth daily.     omeprazole (PRILOSEC) 40 MG capsule Take 40 mg by mouth daily.  0   polyethylene glycol (MIRALAX / GLYCOLAX) 17 g packet Take 17 g by mouth daily.     promethazine (PHENERGAN) 25 MG tablet Take 25 mg by mouth daily as needed for nausea or vomiting.   0   QUEtiapine (SEROQUEL) 100 MG tablet Take 1 tablet (100 mg total) by mouth 2 (two) times daily with breakfast and lunch. 60 tablet 2   QUEtiapine (SEROQUEL) 300 MG tablet Take 2 tablets (600 mg total) by mouth at bedtime. Also take 100 mg with breakfast and lunch 60 tablet 3   ramelteon (ROZEREM) 8 MG tablet Take 1 tablet (8 mg total) by mouth at bedtime. 45 tablet 2   sertraline (ZOLOFT) 100 MG tablet Take 1 tablet (100 mg total) by mouth daily after breakfast. 45 tablet 2   No current facility-administered medications for this visit.     Musculoskeletal: Strength & Muscle Tone:  Unable to asses due to telehealthvisit. Gait & Station:  Unable to asses due to telehealth visit.  Patient leans: N/A  Psychiatric Specialty Exam: Review of Systems  Last menstrual period 04/08/2017, unknown if currently breastfeeding.There is no height or weight on file to calculate BMI.  General Appearance: Well Groomed  Eye Contact:  Good  Speech:  Clear and Coherent and Normal Rate  Volume:  Normal  Mood:  Euthymic, Notes she has occasional anxiety and depression however is able to manage it.  Affect:  Appropriate  and Congruent  Thought Process:  Coherent, Goal  Directed and Linear  Orientation:  Full (Time, Place, and Person)  Thought Content: WDL and Logical   Suicidal Thoughts:  No  Homicidal Thoughts:  No  Memory:  Immediate;   Good Recent;   Good Remote;   Good  Judgement:  Good  Insight:  Good  Psychomotor Activity:  Normal  Concentration:  Concentration: Good and Attention Span: Good  Recall:  Good  Fund of Knowledge: Good  Language: Good  Akathisia:  No  Handed:  Right  AIMS (if indicated): Not done  Assets:  Communication Skills Desire for Improvement Financial Resources/Insurance Housing Social Support  ADL's:  Intact  Cognition: WNL  Sleep:  Fair   Screenings: AIMS    Flowsheet Row [facility]  AIMS Total Score 0      AUDIT    Flowsheet Row [facility]  Alcohol Use Disorder Identification Test Final Score (AUDIT) 25      GAD-7    Flowsheet Row Video Visit from 06/21/2021 in [facility] Video Visit from 12/20/2020 in [facility] Office Visit from [facility]/01/2020 in [place] [facility] Counselor from 03/17/2020 in [facility]  Total GAD-7 Score 8 4 5 11       PHQ2-9    Flowsheet Row Video Visit from 06/21/2021 in [facility] Video Visit from 12/20/2020 in [facility] Office Visit from 07/01/2020 in [place] [facility] 1 Counselor from 03/17/2020 in [facility]  PHQ-2 Total Score 3 0 1 4  PHQ-9 Total Score 14 5 5 12       Flowsheet Row Video Visit from 12/20/2020 in [facility]  C-SSRS RISK CATEGORY No Risk        Assessment and Plan: Today patient notes at times she is anxious and depressed however reports she is able to cope with it.  She notes that her sleep is  disturbed however states she sleeps approximately 6.5 to 7.5 hours nightly.  Provider encouraged patient to utilize her CPAP machine.  At this time no medication changes made.  Patient reports that she does not need Rozerem filled.  Patient agreeable to continue medications as prescribed.  1. Bipolar I disorder (HCC)  Continue- benztropine (COGENTIN) 0.5 MG tablet; Take 1 tablet (0.5 mg total) by mouth at bedtime.  Dispense: 45 tablet; Refill: 3 Continue- QUEtiapine (SEROQUEL) 300 MG tablet; Take 2 tablets (600 mg total) by mouth at bedtime. Also take 100 mg with breakfast and lunch  Dispense: 60 tablet; Refill: 3  2. Major depressive disorder with single episode, in partial remission (HCC)  Continue- buPROPion (WELLBUTRIN SR) 150 MG 12 hr tablet; Take 1 tablet (150 mg total) by mouth 2 (two) times daily.  Dispense: 75 tablet; Refill: 3 Continue- hydrOXYzine (ATARAX/VISTARIL) 10 MG tablet; Take 1 tablet (10 mg total) by mouth 3 (three) times daily as needed. Patient will be traveling and needs a 45 day supply  Dispense: 120 tablet; Refill: 3 Continue- QUEtiapine (SEROQUEL) 300 MG tablet; Take 2 tablets (600 mg total) by mouth at bedtime. Also take 100 mg with breakfast and lunch  Dispense: 60 tablet; Refill: 3     Follow-up in 3 months Follow-up with therapy     12/22/2020, NP 06/21/2021, 4:10 PM

## 2021-06-29 ENCOUNTER — Other Ambulatory Visit: Payer: Self-pay

## 2021-06-29 MED ORDER — DOXEPIN HCL 75 MG PO CAPS
ORAL_CAPSULE | ORAL | 0 refills | Status: DC
  Filled 2021-06-29: qty 30, 30d supply, fill #0

## 2021-07-11 ENCOUNTER — Ambulatory Visit (HOSPITAL_COMMUNITY): Payer: No Payment, Other | Admitting: Clinical

## 2021-07-11 ENCOUNTER — Other Ambulatory Visit: Payer: Self-pay

## 2021-07-21 ENCOUNTER — Telehealth (HOSPITAL_COMMUNITY): Payer: Self-pay | Admitting: *Deleted

## 2021-07-21 NOTE — Telephone Encounter (Signed)
Called to request her dx and meds. Discussed if she wanted me to print her AVS or needed more detail, and if that was the case would give her the number for for medical records. Said she was applying for disability with [employer] but she insisted she only needed her AVS. Mailed it to her home as she requested.

## 2021-07-27 ENCOUNTER — Other Ambulatory Visit: Payer: Self-pay

## 2021-07-27 MED ORDER — DOXEPIN HCL 75 MG PO CAPS
ORAL_CAPSULE | ORAL | 0 refills | Status: DC
  Filled 2021-07-27: qty 30, 30d supply, fill #0

## 2021-07-28 ENCOUNTER — Other Ambulatory Visit: Payer: Self-pay

## 2021-07-29 ENCOUNTER — Other Ambulatory Visit: Payer: Self-pay

## 2021-07-29 ENCOUNTER — Telehealth (HOSPITAL_COMMUNITY): Payer: Self-pay | Admitting: *Deleted

## 2021-07-29 NOTE — Telephone Encounter (Signed)
[place] [facility] Rx REFILL REQUEST QUEtiapine (SEROQUEL) 100 MG tablet

## 2021-08-02 ENCOUNTER — Other Ambulatory Visit (HOSPITAL_COMMUNITY): Payer: Self-pay | Admitting: Psychiatry

## 2021-08-02 DIAGNOSIS — F319 Bipolar disorder, unspecified: Secondary | ICD-10-CM

## 2021-08-02 DIAGNOSIS — F324 Major depressive disorder, single episode, in partial remission: Secondary | ICD-10-CM

## 2021-08-02 MED ORDER — QUETIAPINE FUMARATE 300 MG PO TABS: 600.0000 mg | ORAL_TABLET | Freq: Every day | ORAL | 3 refills | Status: DC

## 2021-08-02 NOTE — Telephone Encounter (Signed)
Medication refilled and sent to preferred pharmacy

## 2021-08-03 ENCOUNTER — Other Ambulatory Visit: Payer: Self-pay

## 2021-08-24 ENCOUNTER — Other Ambulatory Visit: Payer: Self-pay

## 2021-08-24 MED ORDER — DOXEPIN HCL 75 MG PO CAPS
ORAL_CAPSULE | ORAL | 0 refills | Status: DC
  Filled 2021-08-24 – 2021-08-31 (×2): qty 30, 30d supply, fill #0

## 2021-08-30 ENCOUNTER — Other Ambulatory Visit: Payer: Self-pay

## 2021-08-31 ENCOUNTER — Other Ambulatory Visit: Payer: Self-pay

## 2021-09-26 ENCOUNTER — Telehealth (HOSPITAL_COMMUNITY): Payer: No Payment, Other | Admitting: Psychiatry

## 2021-09-26 ENCOUNTER — Encounter (HOSPITAL_COMMUNITY): Payer: Self-pay

## 2021-09-27 ENCOUNTER — Other Ambulatory Visit: Payer: Self-pay

## 2021-09-27 MED ORDER — DOXEPIN HCL 75 MG PO CAPS
ORAL_CAPSULE | ORAL | 0 refills | Status: DC
  Filled 2021-09-27: qty 30, 30d supply, fill #0

## 2021-09-29 ENCOUNTER — Other Ambulatory Visit: Payer: Self-pay

## 2021-09-29 ENCOUNTER — Emergency Department (HOSPITAL_COMMUNITY)
Admission: EM | Admit: 2021-09-29 | Discharge: 2021-09-29 | Disposition: A | Discharge status: Home / Self Care | Payer: No Typology Code available for payment source | Attending: Emergency Medicine | Admitting: Emergency Medicine

## 2021-09-29 ENCOUNTER — Encounter (HOSPITAL_COMMUNITY): Payer: Self-pay | Admitting: Emergency Medicine

## 2021-09-29 ENCOUNTER — Emergency Department (HOSPITAL_COMMUNITY): Payer: No Typology Code available for payment source

## 2021-09-29 DIAGNOSIS — Z87891 Personal history of nicotine dependence: Secondary | ICD-10-CM | POA: Insufficient documentation

## 2021-09-29 DIAGNOSIS — E039 Hypothyroidism, unspecified: Secondary | ICD-10-CM | POA: Insufficient documentation

## 2021-09-29 DIAGNOSIS — Z20822 Contact with and (suspected) exposure to covid-19: Secondary | ICD-10-CM | POA: Insufficient documentation

## 2021-09-29 DIAGNOSIS — J189 Pneumonia, unspecified organism: Secondary | ICD-10-CM

## 2021-09-29 DIAGNOSIS — J181 Lobar pneumonia, unspecified organism: Secondary | ICD-10-CM | POA: Insufficient documentation

## 2021-09-29 DIAGNOSIS — Z79899 Other long term (current) drug therapy: Secondary | ICD-10-CM | POA: Insufficient documentation

## 2021-09-29 DIAGNOSIS — J449 Chronic obstructive pulmonary disease, unspecified: Secondary | ICD-10-CM | POA: Insufficient documentation

## 2021-09-29 LAB — CBC WITH DIFFERENTIAL/PLATELET
Abs Immature Granulocytes: 0.3 10*3/uL — ABNORMAL HIGH (ref 0.00–0.07)
Basophils Absolute: 0.1 10*3/uL (ref 0.0–0.1)
Basophils Relative: 0 %
Eosinophils Absolute: 0.1 10*3/uL (ref 0.0–0.5)
Eosinophils Relative: 1 %
HCT: 34.2 % — ABNORMAL LOW (ref 36.0–46.0)
Hemoglobin: 11.2 g/dL — ABNORMAL LOW (ref 12.0–15.0)
Immature Granulocytes: 1 %
Lymphocytes Relative: 5 %
Lymphs Abs: 1.3 10*3/uL (ref 0.7–4.0)
MCH: 30.6 pg (ref 26.0–34.0)
MCHC: 32.7 g/dL (ref 30.0–36.0)
MCV: 93.4 fL (ref 80.0–100.0)
Monocytes Absolute: 0.9 10*3/uL (ref 0.1–1.0)
Monocytes Relative: 4 %
Neutro Abs: 21.3 10*3/uL — ABNORMAL HIGH (ref 1.7–7.7)
Neutrophils Relative %: 89 %
Platelets: 355 10*3/uL (ref 150–400)
RBC: 3.66 MIL/uL — ABNORMAL LOW (ref 3.87–5.11)
RDW: 14.4 % (ref 11.5–15.5)
WBC: 24 10*3/uL — ABNORMAL HIGH (ref 4.0–10.5)
nRBC: 0 % (ref 0.0–0.2)

## 2021-09-29 LAB — RESP PANEL BY RT-PCR (FLU A&B, COVID) ARPGX2
Influenza A by PCR: NEGATIVE
Influenza B by PCR: NEGATIVE
SARS Coronavirus 2 by RT PCR: NEGATIVE

## 2021-09-29 LAB — BASIC METABOLIC PANEL
Anion gap: 12 (ref 5–15)
BUN: <5 mg/dL — ABNORMAL LOW (ref 6–20)
CO2: 20 mmol/L — ABNORMAL LOW (ref 22–32)
Calcium: 8.3 mg/dL — ABNORMAL LOW (ref 8.9–10.3)
Chloride: 103 mmol/L (ref 98–111)
Creatinine, Ser: 0.69 mg/dL (ref 0.44–1.00)
GFR, Estimated: >60 mL/min (ref >60)
Glucose, Bld: 241 mg/dL — ABNORMAL HIGH (ref 70–99)
Potassium: 3.6 mmol/L (ref 3.5–5.1)
Sodium: 135 mmol/L (ref 135–145)

## 2021-09-29 LAB — TROPONIN I (HIGH SENSITIVITY): Troponin I (High Sensitivity): 4 ng/L (ref <18)

## 2021-09-29 MED ORDER — LEVOFLOXACIN 750 MG PO TABS
750.0000 mg | ORAL_TABLET | Freq: Once | ORAL | Status: AC
  Administered 2021-09-29: 18:00:00 750 mg via ORAL
  Filled 2021-09-29: qty 1

## 2021-09-29 MED ORDER — LEVOFLOXACIN 750 MG PO TABS: 750.0000 mg | ORAL_TABLET | Freq: Every day | ORAL | 0 refills | Status: AC

## 2021-09-29 NOTE — ED Provider Notes (Signed)
[facility]-EMERGENCY DEPT Provider Note   CSN: [id number] Arrival date & time: 09/29/21  1448     History Chief Complaint  Patient presents with   Cough   Shortness of Breath    Mary Dodson is a 55 y.o. female with history of COPD, OSA requiring CPAP, alcohol abuse who presents to the emergency department for shortness of breath.  Patient states that she began having a cough about 5 days ago, and has progressively worsened.  She states is productive.  She also endorses having more shortness of breath in the past 48 hours, with difficulty finishing her sentences.  She woke up this morning with generalized body aches and a fever, which prompted her presentation to the ER.  Highest recorded temperature at home 101.43F.  She attempted to take 2 Goody's powders prior to arrival, with no relief of her symptoms.  She does not wear home oxygen.  She had 1 episode of chest pain several days ago, no new episodes of chest pain or palpitations.   Cough Associated symptoms: fever, myalgias and shortness of breath   Associated symptoms: no chest pain and no sore throat   Shortness of Breath Associated symptoms: cough and fever   Associated symptoms: no abdominal pain, no chest pain, no sore throat and no vomiting       Past Medical History:  Diagnosis Date   Alcohol abuse    Bipolar disease, chronic (HCC)    Hypothyroidism (acquired)    PTSD (post-traumatic stress disorder)     Patient Active Problem List   Diagnosis Date Noted   Yeast infection of the skin 07/01/2020   Bipolar I disorder, most recent episode (or current) manic (HCC) 03/19/2020   Alcohol use disorder, severe, in sustained remission (HCC) 03/17/2020   Altered mental status    Acute on chronic respiratory failure with hypoxia (HCC) 04/22/2019   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   Alcohol abuse    Bipolar disease, chronic (HCC)    Hypothyroidism (acquired)    ASCUS of cervix with negative high risk  HPV 03/19/2018   Helicobacter pylori gastritis 03/19/2018   Hypothyroidism 03/19/2018   Bipolar disorder, current episode manic severe with psychotic features (HCC) 10/06/2016   Alcohol use disorder, severe, dependence (HCC) 10/06/2016   Alcohol use disorder, severe, dependence (HCC) 09/26/2016   Major depressive disorder with single episode, in partial remission (HCC) 09/26/2016   Acute encephalopathy 09/20/2016   Airway intubation performed without difficulty    Hypothermia    Alcoholic intoxication with complication (HCC)    COPD with acute exacerbation (HCC)     History reviewed. No pertinent surgical history.   OB History     Gravida  4   Para  0   Term  0   Preterm  0   AB  1   Living         SAB  1   IAB  0   Ectopic  0   Multiple      Live Births  2           Family History  Problem Relation Age of Onset   Cancer Mother        suicide   Breast cancer Maternal Grandmother    Cancer Maternal Grandmother    Heart attack Father     Social History   Tobacco Use   Smoking status: Former    Packs/day: 1.00    Types: Cigarettes    Quit date: 04/30/2019  Years since quitting: 2.4   Smokeless tobacco: Never  Vaping Use   Vaping Use: Some days  Substance Use Topics   Alcohol use: Not Currently    Comment: recovering alcoholic   Drug use: No    Home Medications Prior to Admission medications   Medication Sig Start Date End Date Taking? Authorizing Provider  levofloxacin (LEVAQUIN) 750 MG tablet Take 1 tablet (750 mg total) by mouth daily. 09/29/21  Yes Dehlia Kilner T, PA-C  albuterol (VENTOLIN HFA) 108 (90 Base) MCG/ACT inhaler Inhale 2 puffs into the lungs 4 (four) times daily as needed for wheezing or shortness of breath (cough).     [provider]  Amino Acids (AMINO ACID PO) Take 1 tablet by mouth daily.    [provider]  Aspirin-Salicylamide-Caffeine (BC HEADACHE PO) Take 6-7 packets by mouth daily as needed  (headache).    [provider]  benztropine (COGENTIN) 0.5 MG tablet Take 1 tablet (0.5 mg total) by mouth at bedtime. 06/21/21   Shanna Cisco, NP  buPROPion (WELLBUTRIN SR) 150 MG 12 hr tablet Take 1 tablet (150 mg total) by mouth 2 (two) times daily. 06/21/21   Shanna Cisco, NP  calcium carbonate (OSCAL) 1500 (600 Ca) MG TABS tablet Take 600 mg of elemental calcium by mouth daily with breakfast.    [provider]  doxepin (SINEQUAN) 75 MG capsule Take 1 capsule by mouth once a day as needed 01/25/21     doxepin (SINEQUAN) 75 MG capsule Take 1 capsule by mouth once a day as needed 03/16/21     doxepin (SINEQUAN) 75 MG capsule Take 1 capsule by mouth once a day as needed 04/13/21     doxepin (SINEQUAN) 75 MG capsule Take 1 capsule by mouth once a day as needed 06/29/21     doxepin (SINEQUAN) 75 MG capsule Take 1 capsule by mouth once a day as needed 07/27/21     doxepin (SINEQUAN) 75 MG capsule Take 1 capsule by mouth once a day as needed 08/24/21     doxepin (SINEQUAN) 75 MG capsule Take 1 capsule by mouth once a day as needed 09/27/21     ferrous sulfate 325 (65 FE) MG tablet Take 325 mg by mouth daily.    [provider]  gabapentin (NEURONTIN) 800 MG tablet Take 800 mg by mouth 3 (three) times daily.    [provider]  HYDROcodone-acetaminophen (NORCO/VICODIN) 5-325 MG tablet Take 1 tablet by mouth every 4 (four) hours as needed for moderate pain. Patient not taking: Reported on 07/01/2020 01/27/20   Cuthriell, Delorise Royals, PA-C  hydrOXYzine (ATARAX/VISTARIL) 10 MG tablet Take 1 tablet (10 mg total) by mouth 3 (three) times daily as needed. Patient will be traveling and needs a 45 day supply 06/21/21   Shanna Cisco, NP  levothyroxine (SYNTHROID) 75 MCG tablet Take 75 mcg by mouth daily.    [provider]  methocarbamol (ROBAXIN) 500 MG tablet Take 1 tablet (500 mg total) by mouth 4 (four) times daily. Patient not taking: Reported on  07/01/2020 01/27/20   Cuthriell, Delorise Royals, PA-C  Multiple Vitamins-Minerals (MULTIVITAMIN WITH MINERALS) tablet Take 1 tablet by mouth daily.    [provider]  nystatin (MYCOSTATIN/NYSTOP) powder Apply 1 application topically 3 (three) times daily. 07/07/20   Mayers, Cari S, PA-C  Omega-3 Fatty Acids (FISH OIL OMEGA-3 PO) Take 1 tablet by mouth daily.    [provider]  omeprazole (PRILOSEC) 40 MG capsule Take 40  mg by mouth daily. 05/27/18   [provider]  polyethylene glycol (MIRALAX / GLYCOLAX) 17 g packet Take 17 g by mouth daily.    [provider]  promethazine (PHENERGAN) 25 MG tablet Take 25 mg by mouth daily as needed for nausea or vomiting.  05/27/18   [provider]  QUEtiapine (SEROQUEL) 300 MG tablet Take 2 tablets (600 mg total) by mouth at bedtime. Also take 100 mg with breakfast and lunch 08/02/21   Toy Cookey E, NP  ramelteon (ROZEREM) 8 MG tablet Take 1 tablet (8 mg total) by mouth at bedtime. 01/26/21   Shanna Cisco, NP  sertraline (ZOLOFT) 100 MG tablet Take 1 tablet (100 mg total) by mouth daily after breakfast. 01/26/21   Shanna Cisco, NP    Allergies    Morphine and related and Trazodone and nefazodone  Review of Systems   Review of Systems  Constitutional:  Positive for fever. Negative for appetite change.  HENT:  Negative for congestion, sore throat and trouble swallowing.   Respiratory:  Positive for cough and shortness of breath.   Cardiovascular:  Negative for chest pain.  Gastrointestinal:  Negative for abdominal pain, constipation, diarrhea, nausea and vomiting.  Musculoskeletal:  Positive for myalgias.  All other systems reviewed and are negative.  Physical Exam Updated Vital Signs BP 118/70    Pulse 98    Temp 98.9 F (37.2 C) (Oral)    Resp 17    LMP 04/08/2017 (Exact Date)    SpO2 95%   Physical Exam Vitals and nursing note reviewed.  Constitutional:      General: She is not in acute  distress.    Appearance: Normal appearance. She is not ill-appearing or toxic-appearing.  HENT:     Head: Normocephalic and atraumatic.  Eyes:     Conjunctiva/sclera: Conjunctivae normal.  Cardiovascular:     Rate and Rhythm: Normal rate and regular rhythm.  Pulmonary:     Effort: Pulmonary effort is normal. No respiratory distress.     Comments: Adventitious lung sounds diffusely with rales, no wheezing. Good air movement to the bases.  Abdominal:     General: There is no distension.     Palpations: Abdomen is soft.     Tenderness: There is no abdominal tenderness.  Skin:    General: Skin is warm and dry.  Neurological:     General: No focal deficit present.     Mental Status: She is alert.    ED Results / Procedures / Treatments   Labs (all labs ordered are listed, but only abnormal results are displayed) Labs Reviewed  BASIC METABOLIC PANEL - Abnormal; Notable for the following components:      Result Value   CO2 20 (*)    Glucose, Bld 241 (*)    BUN <5 (*)    Calcium 8.3 (*)    All other components within normal limits  CBC WITH DIFFERENTIAL/PLATELET - Abnormal; Notable for the following components:   WBC 24.0 (*)    RBC 3.66 (*)    Hemoglobin 11.2 (*)    HCT 34.2 (*)    Neutro Abs 21.3 (*)    Abs Immature Granulocytes 0.30 (*)    All other components within normal limits  RESP PANEL BY RT-PCR (FLU A&B, COVID) ARPGX2  TROPONIN I (HIGH SENSITIVITY)    EKG EKG Interpretation  Date/Time:  Thursday September 29 2021 16:03:46 EST Ventricular Rate:  93 PR Interval:  159 QRS Duration: 81 QT Interval:  329 QTC Calculation: 410 R Axis:   74 Text Interpretation: Sinus rhythm Low voltage, precordial leads Probable anteroseptal infarct, old since last tracing no significant change Confirmed by Mancel Bale [phone]) on 09/29/2021 5:25:43 PM  Radiology DG Chest 2 View  Result Date: 09/29/2021 CLINICAL DATA:  Shortness of breath and cough with fever. EXAM: CHEST - 2  VIEW COMPARISON:  Chest x-ray 04/24/2019. FINDINGS: There is some focal airspace disease in the left mid lung and left lower lobe. There is no pleural effusion or pneumothorax. Cardiomediastinal silhouette is within normal limits. There are surgical clips overlying the left neck. No fractures are identified. IMPRESSION: 1. Patchy airspace disease in the left mid lung and left lower lobe worrisome for multifocal pneumonia. Followup PA and lateral chest X-ray is recommended in 3-4 weeks following trial of antibiotic therapy to ensure resolution and exclude underlying malignancy. Electronically Signed   By: Darliss Cheney M.D.   On: 09/29/2021 15:19    Procedures Procedures   Medications Ordered in ED Medications  levofloxacin (LEVAQUIN) tablet 750 mg (750 mg Oral Given 09/29/21 1829)    ED Course  I have reviewed the triage vital signs and the nursing notes.  Pertinent labs & imaging results that were available during my care of the patient were reviewed by me and considered in my medical decision making (see chart for details).    MDM Rules/Calculators/A&P                          Patient is 55 year old female with history of COPD and alcohol abuse who presents emergency department complaining of 5 days of cough and shortness of breath.  On my exam patient is afebrile, not tachycardic, not hypoxic, in no acute distress.  She has diffuse adventitious lung sounds bilaterally with rales, no wheezing or consolidation.  Patient ambulated had slight decrease in her oxygen saturation, but this improved upon sitting and resting.  On reevaluation patient had fallen asleep and her oxygen saturation dipped to 86%, she states that she normally wears a CPAP for her obstructive sleep apnea.  Lab significant for leukocytosis of 24,000, normal kidney function.  Flu and COVID testing negative.  Troponin 4.  Chest x-ray concerning for multifocal pneumonia, with patchy infiltrates and left lower lobe  consolidation.  Patient is not toxic appearing, appears clinically well, with stable vital signs.  She is not requiring admission or inpatient treatment for her symptoms at this time.  Plan to discharge home with antibiotics, and give close return precautions.  Patient agreeable to plan.  I discussed this case with my attending physician Dr. Effie Shy who cosigned this note including patient's presenting symptoms, physical exam, and planned diagnostics and interventions. Attending physician stated agreement with plan or made changes to plan which were implemented.    Final Clinical Impression(s) / ED Diagnoses Final diagnoses:  Community acquired pneumonia of left lower lobe of lung    Rx / DC Orders ED Discharge Orders          Ordered    levofloxacin (LEVAQUIN) 750 MG tablet  Daily        09/29/21 1825           Portions of this report may have been transcribed using voice recognition software. Every effort was made to ensure accuracy; however, inadvertent computerized transcription errors may be present.    Jeanella Flattery 09/29/21 1835    Mancel Bale, MD 09/29/21 [phone]

## 2021-09-29 NOTE — Discharge Instructions (Addendum)
You were seen in the emergency department today for shortness of breath.  As we discussed with your lab work and your chest x-ray, you have pneumonia in the left side of your lung.  I am placing you on antibiotics, and have given you 1 dose here, and like you to take it once daily for the next 6 days.  It is important that you finish the entire course, regardless of how you feel.  You can take Goody's powder, ibuprofen or Tylenol as needed for fever.  When you are feeling winded at home, I want you to use your albuterol inhaler as prescribed.  Make sure that you are wearing your CPAP at night while sleeping.  Continue to monitor how you're doing and return to the ER for new or worsening symptoms such as worsening shortness of breath, fever despite medication.   It has been a pleasure seeing and caring for you today and I hope you start feeling better soon!

## 2021-09-29 NOTE — ED Provider Notes (Signed)
Emergency Medicine Provider Triage Evaluation Note  Mary Dodson , a 55 y.o. female  was evaluated in triage.  Pt complains of shortness of breath.  She states that she began having cough about 5 days ago.  She states that it is progressively worsened.  She states that she has productive cough, "Rales and popping."  She endorses having increased shortness of breath over the past 48 hours with difficulty finishing sentences.  Today she woke up with generalized body aches and fever which prompted her presentation to the emergency department.  She states that she took 2 Goody powders prior to arriving.  Does not wear home O2.  Does endorse chest pain "a few days ago."  Denies current chest pain or palpitations.  Review of Systems  Positive: See above Negative:  Physical Exam  BP 99/86 (BP Location: Right Arm)    Pulse 97    Temp 98.8 F (37.1 C) (Oral)    Resp 18    LMP 04/08/2017 (Exact Date)    SpO2 94%  Gen:   Awake, alert and oriented Resp:  Tachypnea, right lower and middle lobe decreased lung sounds, rhonchi.  Right upper lobe wheezing.  Left lower lobe with decreased lung sounds. MSK:   Moves extremities without difficulty  Other:  S1/S2 without murmur  Medical Decision Making  Medically screening exam initiated at 3:03 PM.  Appropriate orders placed.  Etta Gassett was informed that the remainder of the evaluation will be completed by another provider, this initial triage assessment does not replace that evaluation, and the importance of remaining in the ED until their evaluation is complete.     Cristopher Peru, PA-C 09/29/21 1505    Derwood Kaplan, MD 09/30/21 [phone]

## 2021-09-29 NOTE — ED Triage Notes (Signed)
Per pt, states SOB, cough and fever for 5 days-states she took 2 BC powders for fever

## 2021-10-04 ENCOUNTER — Other Ambulatory Visit: Payer: Self-pay

## 2021-10-05 ENCOUNTER — Other Ambulatory Visit: Payer: Self-pay

## 2021-10-12 ENCOUNTER — Telehealth (HOSPITAL_COMMUNITY): Payer: Self-pay | Admitting: *Deleted

## 2021-10-12 NOTE — Telephone Encounter (Signed)
Call from patient stating she believes she is developing some "kind of immunity "to her HS Seroquel and may need it increased though she is taking 600 mg at HS now and BID dose of 100 mg. Will forward her concern to the provider but told her it would be tomorrow before the dr could address it, as its late in the day.

## 2021-10-13 NOTE — Telephone Encounter (Signed)
Provider attempted to call patient twice without success.  Provider left patient voicemail informing her that she can walk into the clinic if needed to address concerns.  No other concerns at this time

## 2021-10-17 ENCOUNTER — Telehealth (HOSPITAL_COMMUNITY): Payer: Self-pay | Admitting: *Deleted

## 2021-10-17 NOTE — Telephone Encounter (Signed)
Patient LVM that she called last week about:  stating she believes she is developing some "kind of immunity "to her HS Seroquel and may need it increased though she is taking 600 mg at HS now and BID dose of 100 mg.  VM was left to inform patient that on that same week :  Provider attempted to call patient twice without success.  Provider left patient voicemail informing her that she can walk into the clinic if needed to address concerns.   Once again did re-address that a walk-in option would be beneficial. Along with date & time if providers walk-in schedule

## 2021-10-17 NOTE — Telephone Encounter (Signed)
Patient LVM that she called last week about:  °stating she believes she is developing some "kind of immunity "to her HS Seroquel and may need it increased though she is taking 600 mg at HS now and BID dose of 100 mg. ° °VM was left to inform patient that on that same week :  °Provider attempted to call patient twice without success.  Provider left patient voicemail informing her that she can walk into the clinic if needed to address concerns.  ° °Once again did re-address that a walk-in option would be beneficial. Along with date & time if providers walk-in schedule  ° °

## 2021-10-18 NOTE — Telephone Encounter (Signed)
Thank you for this update 

## 2021-10-20 ENCOUNTER — Telehealth: Payer: Self-pay | Admitting: *Deleted

## 2021-10-20 NOTE — Telephone Encounter (Signed)
Copied from CRM [id number]. Topic: Complaint - Provider (non-sensitive) °>> Aug 16, 2020  1:17 PM [name], Norma J wrote: °Date of Incident:Pt dropped off the financial paperwork around 08-11-2020 Details of complaint:Pt is calling and would like to speak practice administrator .How would the patient like to see it resolved? Pt would like to know why her application was mailed back to her . Pt is trying to get apply for orange card ° °Route to Practice Administrator. °

## 2021-10-24 ENCOUNTER — Telehealth (HOSPITAL_COMMUNITY): Payer: Self-pay | Admitting: Psychiatry

## 2021-10-24 NOTE — Telephone Encounter (Signed)
Patient is wanting to speak with provider regarding Seroquel. She states that she has been taking this medication for years and feels that her body is becoming immune to it. She is needing help with sleeping. Writer informed this message will be sent to provider and responded to at earliest convenience.

## 2021-10-25 ENCOUNTER — Telehealth (HOSPITAL_COMMUNITY): Payer: Self-pay | Admitting: *Deleted

## 2021-10-25 ENCOUNTER — Other Ambulatory Visit (HOSPITAL_COMMUNITY): Payer: Self-pay | Admitting: Psychiatry

## 2021-10-25 DIAGNOSIS — F319 Bipolar disorder, unspecified: Secondary | ICD-10-CM

## 2021-10-25 DIAGNOSIS — F324 Major depressive disorder, single episode, in partial remission: Secondary | ICD-10-CM

## 2021-10-25 MED ORDER — ESZOPICLONE 2 MG PO TABS: 2.0000 mg | ORAL_TABLET | Freq: Every evening | ORAL | 3 refills | Status: DC | PRN

## 2021-10-25 MED ORDER — QUETIAPINE FUMARATE 400 MG PO TABS: 800.0000 mg | ORAL_TABLET | Freq: Every day | ORAL | 3 refills | Status: DC

## 2021-10-25 NOTE — Telephone Encounter (Signed)
Called back to ask provider to call in rx for her wellbutrin. Will ask Dr Doyne Keel for a new rx to be [facility] .

## 2021-10-25 NOTE — Telephone Encounter (Signed)
Walk in agitated by her medicine and not getting sleep. States she has been taking 800 mg at HS along with 400 mg an hour later and continues also to take 200 mg in the day. She also in complaining of her poor quality of life related to her very dry mouth. She was able to speak to her provider, Eulis Canner DNP via phone and changes were made to her routine. She is to not exceed 800 mg, take it all at HS along with a 2mg  pill of Lunesta which is a new rx for her. She is going out of town to visit her daughter in [place] till April. Future appts will be virtual.

## 2021-10-25 NOTE — Telephone Encounter (Signed)
Patient notes that she has been on Seroquel since 2017 and found it effective. She reports that she has been taking her 800 mg tablet as well as an extra 400 mg and been experiencing dry mouth. Provider informed patient that the recommend max dose for Seroquel is 800 mg and her dry mouth is potentially due to her increased dose. Patient currently take Rozerem and Doxipin (as needed). She reports that she tired Ambien in the past but it caused here to drive while sleeping. Today patient agreeable to start Lunesta 2 mg to help manage sleep. Provider encouraged patient to take Seroquel 800 mg nightly. She endorsed understanding and agreed. Provider asked patient if she has had a sleep study. Per chart review she did have a sleep study and was given a C-Pap machine for sleep apnea in 2021. Patient notes that she does not use her C-Pap machine. Provider recommend patient use this to help manage sleep. She endorsed understanding and agreed. No other concerns noted at this time.

## 2021-10-26 ENCOUNTER — Other Ambulatory Visit (HOSPITAL_COMMUNITY): Payer: Self-pay | Admitting: Psychiatry

## 2021-10-26 ENCOUNTER — Telehealth (HOSPITAL_COMMUNITY): Payer: Self-pay | Admitting: *Deleted

## 2021-10-26 DIAGNOSIS — F324 Major depressive disorder, single episode, in partial remission: Secondary | ICD-10-CM

## 2021-10-26 MED ORDER — HYDROXYZINE HCL 10 MG PO TABS: 10.0000 mg | ORAL_TABLET | Freq: Three times a day (TID) | ORAL | 3 refills | Status: DC | PRN

## 2021-10-26 MED ORDER — BUPROPION HCL ER (SR) 150 MG PO TB12: 150.0000 mg | ORAL_TABLET | Freq: Two times a day (BID) | ORAL | 3 refills | Status: DC

## 2021-10-26 NOTE — Telephone Encounter (Signed)
Medication refilled and sent to preferred pharmacy

## 2021-10-26 NOTE — Telephone Encounter (Signed)
Pharmacy fax request for patient to have a new rx for her Hydroxyzine sent to [facility]. Will alert provider, she should be out of it per chart.

## 2021-11-01 ENCOUNTER — Telehealth (HOSPITAL_COMMUNITY): Payer: Self-pay | Admitting: *Deleted

## 2021-11-01 ENCOUNTER — Other Ambulatory Visit (HOSPITAL_COMMUNITY): Payer: Self-pay | Admitting: Psychiatry

## 2021-11-01 MED ORDER — ESZOPICLONE 2 MG PO TABS: 2.0000 mg | ORAL_TABLET | Freq: Every evening | ORAL | 3 refills | Status: DC | PRN

## 2021-11-01 NOTE — Telephone Encounter (Signed)
Medication refilled and sent to preferred pharmacy

## 2021-11-01 NOTE — Telephone Encounter (Signed)
Patient called to inform that [facility] doesn't carry Lunesta & requested script go to [facility] on [address] [phone]

## 2021-11-02 NOTE — Telephone Encounter (Signed)
Copied from CRM [id number]. Topic: Complaint - Provider (non-sensitive) °>> Aug 16, 2020  1:17 PM [name], Norma J wrote: °Date of Incident:Pt dropped off the financial paperwork around 08-11-2020 Details of complaint:Pt is calling and would like to speak practice administrator .How would the patient like to see it resolved? Pt would like to know why her application was mailed back to her . Pt is trying to get apply for orange card ° °Route to Practice Administrator. °

## 2021-11-02 NOTE — Telephone Encounter (Signed)
done

## 2021-11-03 ENCOUNTER — Telehealth (HOSPITAL_COMMUNITY): Payer: Self-pay | Admitting: *Deleted

## 2021-11-03 ENCOUNTER — Other Ambulatory Visit (HOSPITAL_COMMUNITY): Payer: Self-pay | Admitting: Psychiatry

## 2021-11-03 ENCOUNTER — Telehealth (HOSPITAL_COMMUNITY): Payer: Self-pay | Admitting: Psychiatry

## 2021-11-03 DIAGNOSIS — F324 Major depressive disorder, single episode, in partial remission: Secondary | ICD-10-CM

## 2021-11-03 DIAGNOSIS — F319 Bipolar disorder, unspecified: Secondary | ICD-10-CM

## 2021-11-03 MED ORDER — QUETIAPINE FUMARATE 400 MG PO TABS: 800.0000 mg | ORAL_TABLET | Freq: Every day | ORAL | 1 refills | Status: DC

## 2021-11-03 MED ORDER — HYDROXYZINE HCL 10 MG PO TABS: 10.0000 mg | ORAL_TABLET | Freq: Three times a day (TID) | ORAL | 1 refills | Status: DC | PRN

## 2021-11-03 MED ORDER — BENZTROPINE MESYLATE 0.5 MG PO TABS: 0.5000 mg | ORAL_TABLET | Freq: Every day | ORAL | 1 refills | Status: DC

## 2021-11-03 MED ORDER — SERTRALINE HCL 100 MG PO TABS: 100.0000 mg | ORAL_TABLET | Freq: Every day | ORAL | 1 refills | Status: DC

## 2021-11-03 MED ORDER — BUPROPION HCL ER (SR) 150 MG PO TB12: 150.0000 mg | ORAL_TABLET | Freq: Two times a day (BID) | ORAL | 1 refills | Status: DC

## 2021-11-03 NOTE — Telephone Encounter (Signed)
Seroquel, hydroxyzine, Wellbutrin, Cogentin, and Zoloft sent to [facility].  Patient receives doxepin and gabapentin from her primary care doctor.

## 2021-11-03 NOTE — Telephone Encounter (Signed)
Has called twice to ask provider to call her meds into [facility] for 3 months as she is going to [place] to visit her daughter and will not be back till Easter time. Will forward the request for Dr Kathlene November to consider.

## 2021-11-03 NOTE — Telephone Encounter (Signed)
Patient called regarding medications. Confirmed medications were sent to the pharmacy with 3 refills each. Pt verbalized understanding without complaint.

## 2021-11-08 ENCOUNTER — Other Ambulatory Visit (HOSPITAL_COMMUNITY): Payer: Self-pay | Admitting: Psychiatry

## 2021-11-08 ENCOUNTER — Telehealth (HOSPITAL_COMMUNITY): Payer: Self-pay | Admitting: *Deleted

## 2021-11-08 DIAGNOSIS — F324 Major depressive disorder, single episode, in partial remission: Secondary | ICD-10-CM

## 2021-11-08 DIAGNOSIS — F319 Bipolar disorder, unspecified: Secondary | ICD-10-CM

## 2021-11-08 MED ORDER — HYDROXYZINE HCL 10 MG PO TABS: 10.0000 mg | ORAL_TABLET | Freq: Three times a day (TID) | ORAL | 1 refills | Status: DC | PRN

## 2021-11-08 MED ORDER — SERTRALINE HCL 100 MG PO TABS: 100.0000 mg | ORAL_TABLET | Freq: Every day | ORAL | 1 refills | Status: DC

## 2021-11-08 MED ORDER — QUETIAPINE FUMARATE 400 MG PO TABS: 800.0000 mg | ORAL_TABLET | Freq: Every day | ORAL | 1 refills | Status: DC

## 2021-11-08 MED ORDER — BUPROPION HCL ER (SR) 150 MG PO TB12: 150.0000 mg | ORAL_TABLET | Freq: Two times a day (BID) | ORAL | 1 refills | Status: DC

## 2021-11-08 MED ORDER — BENZTROPINE MESYLATE 0.5 MG PO TABS: 0.5000 mg | ORAL_TABLET | Freq: Every day | ORAL | 1 refills | Status: DC

## 2021-11-08 NOTE — Telephone Encounter (Signed)
Doxepin filled by patient PCP.  Other medications sent to [facility] on [address]

## 2021-11-08 NOTE — Telephone Encounter (Signed)
VM from patient stating she spoke with her pharmacy which is [facility] and she knows she has refills available on her medicine but she will be in [place] visiting family for three months and the pharmacy wont send them to [place] they need a 90 day supply rather than multiple refills and they can send the 90 day supply to her before she leaves on 11/14/21 to cover her out of state stay. Will notify Dr Doyne Keel of her request.

## 2021-11-08 NOTE — Telephone Encounter (Signed)
Doxepin filled by patient PCP.  Other medications sent to [facility] on [address]

## 2021-11-08 NOTE — Telephone Encounter (Signed)
Spoke with Mary Dodson to clarify her meds, the following she needs called to [facility]: Seroquel, Zoloft, Wellburtrin, and Vistaril, Doxepin she needs to be called in to [facility] on [address].

## 2021-11-10 ENCOUNTER — Other Ambulatory Visit (HOSPITAL_COMMUNITY): Payer: Self-pay

## 2021-11-10 MED ORDER — DOXEPIN HCL 75 MG PO CAPS
75.0000 mg | ORAL_CAPSULE | ORAL | 0 refills | Status: DC
  Filled 2021-11-10: qty 60, 60d supply, fill #0

## 2021-11-22 ENCOUNTER — Other Ambulatory Visit: Payer: Self-pay

## 2021-11-22 ENCOUNTER — Ambulatory Visit (INDEPENDENT_AMBULATORY_CARE_PROVIDER_SITE_OTHER): Payer: No Payment, Other | Admitting: Clinical

## 2021-11-22 DIAGNOSIS — F319 Bipolar disorder, unspecified: Secondary | ICD-10-CM | POA: Diagnosis not present

## 2021-11-22 NOTE — Progress Notes (Signed)
° °  THERAPIST PROGRESS NOTE Virtual Visit via Telephone Note  I connected with Mary Dodson on 11/22/21 at  2:00 PM EST by telephone and verified that I am speaking with the correct person using two identifiers.  Location: Patient: home Provider: office   I discussed the limitations, risks, security and privacy concerns of performing an evaluation and management service by telephone and the availability of in person appointments. I also discussed with the patient that there may be a patient responsible charge related to this service. The patient expressed understanding and agreed to proceed.   Follow Up Instructions: I discussed the assessment and treatment plan with the patient. The patient was provided an opportunity to ask questions and all were answered. The patient agreed with the plan and demonstrated an understanding of the instructions.   The patient was advised to call back or seek an in-person evaluation if the symptoms worsen or if the condition fails to improve as anticipated.   Session Time: 25 minutes  Participation Level: Active  Behavioral Response: NAAlertEuthymic  Type of Therapy: Individual Therapy  Treatment Goals addressed: goal setting  ProgressTowards Goals: Progressing  Interventions: CBT and Supportive  Summary:  Mary Dodson is a 56 y.o. female who presents for the scheduled session oriented x5, and cooperative.  Client denies hallucinations and delusions. Client reported on today she is doing well.  Client reported she is currently in [place] visiting her daughter's.  Client reported she will be there until early April.  Client reported also she will make appointment to go visit her sister whom she reconnected with in [place].  Client reported they have a very good relationship and talk frequently.  Client reported since her daughter had her baby the relationship continues to get closer.  Client reported her daughter calls her several times a day and  expressed how much she needs her. Client reported being surprised that her daughter is gravitating to her more and more.  Client reported over the past few months she has been contemplating about moving but has decided she will look into moving closer to her daughter's.  Client reported growing up she was very close to her mother and grandmother and wants to have that close bond with her children.  Client reported she is feeling very hopeful about her future.   Suicidal/Homicidal: Nowithout intent/plan  Therapist Response:  Therapist began the appointment asking the client how she has been doing since last seen. Therapist used CBT to engage with active listening and positive emotional support. Therapist used CBT to ask the client to describe the changes that have occurred and her mood. Therapist used CBT to discuss with the client how her change of perspective in correlated to her childhood. Therapist assigned the client homework to practice self care. Client was scheduled for appointment.    Plan: Return again in 5 weeks.  Diagnosis: bipolar 1 disorder  Collaboration of Care: Medication Management [facility]EB [facility] and Referral or follow-up with counselor/therapist [facility]  Patient/Guardian was advised Release of Information must be obtained prior to any record release in order to collaborate their care with an outside provider. Patient/Guardian was advised if they have not already done so to contact the registration department to sign all necessary forms in order for [place] to release information regarding their care.   Consent: Patient/Guardian gives verbal consent for treatment and assignment of benefits for services provided during this telehealth visit. Patient/Guardian expressed understanding and agreed to proceed.   Neena Rhymes Henreitta Spittler, LCSW 11/22/2021

## 2021-11-22 NOTE — Plan of Care (Signed)
Client was in agreement to the plan. ?

## 2021-12-01 ENCOUNTER — Other Ambulatory Visit (HOSPITAL_COMMUNITY): Payer: Self-pay

## 2021-12-12 ENCOUNTER — Telehealth (INDEPENDENT_AMBULATORY_CARE_PROVIDER_SITE_OTHER): Payer: No Payment, Other | Admitting: Psychiatry

## 2021-12-12 DIAGNOSIS — F319 Bipolar disorder, unspecified: Secondary | ICD-10-CM

## 2021-12-12 DIAGNOSIS — F324 Major depressive disorder, single episode, in partial remission: Secondary | ICD-10-CM | POA: Diagnosis not present

## 2021-12-12 MED ORDER — QUETIAPINE FUMARATE 400 MG PO TABS: 800.0000 mg | ORAL_TABLET | Freq: Every day | ORAL | 1 refills | Status: DC

## 2021-12-12 MED ORDER — BUPROPION HCL ER (SR) 150 MG PO TB12: 150.0000 mg | ORAL_TABLET | Freq: Two times a day (BID) | ORAL | 1 refills | Status: DC

## 2021-12-12 MED ORDER — SERTRALINE HCL 100 MG PO TABS: 100.0000 mg | ORAL_TABLET | Freq: Every day | ORAL | 1 refills | Status: DC

## 2021-12-12 MED ORDER — HYDROXYZINE HCL 10 MG PO TABS: 10.0000 mg | ORAL_TABLET | Freq: Three times a day (TID) | ORAL | 1 refills | Status: DC | PRN

## 2021-12-12 MED ORDER — BENZTROPINE MESYLATE 0.5 MG PO TABS: 0.5000 mg | ORAL_TABLET | Freq: Every day | ORAL | 1 refills | Status: DC

## 2021-12-12 NOTE — Progress Notes (Signed)
[facility] MD/PA/NP OP Progress Note  12/12/2021 9:48 AM [name]  MRN:  [id number]  Virtual Visit via Telephone Note  I connected with Mary Dodson on 12/12/21 at  9:30 AM EST by telephone and verified that I am speaking with the correct person using two identifiers.  Location: Patient: home  Provider: clinic   I discussed the limitations, risks, security and privacy concerns of performing an evaluation and management service by telephone and the availability of in person appointments. I also discussed with the patient that there may be a patient responsible charge related to this service. The patient expressed understanding and agreed to proceed.    I discussed the assessment and treatment plan with the patient. The patient was provided an opportunity to ask questions and all were answered. The patient agreed with the plan and demonstrated an understanding of the instructions.   The patient was advised to call back or seek an in-person evaluation if the symptoms worsen or if the condition fails to improve as anticipated.  I provided 20 minutes of non-face-to-face time during this encounter.  [name], NP   Chief Complaint: Medication management  HPI: Mary Dodson is a 56 year old female presenting to [facility] behavioral health outpatient for a follow up psychiatric evaluation.  She has a past psychiatric history of alcohol use disorder in remission, major depressive disorder, and bipolar disorder.  Her symptoms are managed with Zoloft 100 mg daily, Cogentin 0.5 mg daily, Seroquel 800 mg at bedtime, hydroxyzine 10 mg 3 times daily as needed for anxiety, Wellbutrin SR 150 mg twice daily. She reports medication compliance and effectiveness. She denies adverse medication effects.  Patient reports recently moving to [place] to live with her child and plans of babysitting her grandchildren.  She reports concerns regarding her autonomy in the future as it relates to living with her child.   Patient made aware that if she plans on staying in [place] long-term she will need to seek services that are local to her area. Patient is alert and oriented x4, calm, pleasant and willing to engage.  She reports a good mood, sleep, and appetite.  She denies auditory or visual hallucinations, suicidal or homicidal ideations, or paranoia, or delusions.  Visit Diagnosis:    ICD-10-CM   1. Bipolar I disorder ([place])  F31.9     2. Major depressive disorder with single episode, in partial remission ([place])  F32.4       Past Psychiatric History: alcohol use disorder, in remission, major depressive disorder, bipolar disorder  Past Medical History:  Past Medical History:  Diagnosis Date   Alcohol abuse    Bipolar disease, chronic ([place])    Hypothyroidism (acquired)    PTSD (post-traumatic stress disorder)    No past surgical history on file.  Family Psychiatric History: none  Family History:  Family History  Problem Relation Age of Onset   Cancer Mother        suicide   Breast cancer Maternal Grandmother    Cancer Maternal Grandmother    Heart attack Father     Social History:  Social History   Socioeconomic History   Marital status: Single    Spouse name: Not on file   Number of children: 2   Years of education: Not on file   Highest education level: Bachelor's degree (e.g., BA, AB, BS)  Occupational History   Occupation: unemployed  Tobacco Use   Smoking status: Former    Packs/day: 1.00    Types: Cigarettes  Quit date: 04/30/2019    Years since quitting: 2.6   Smokeless tobacco: Never  Vaping Use   Vaping Use: Some days  Substance and Sexual Activity   Alcohol use: Not Currently    Comment: recovering alcoholic   Drug use: No   Sexual activity: Not Currently    Birth control/protection: None  Other Topics Concern   Not on file  Social History Narrative   ** Merged History Encounter **       ** Merged History Encounter **      Patient is right-handed. She  lives alone in a one level home. She drinks 5 cups of coffee a week, and 6 cans of Diet-Pepsi a day. Patient states she uses 6-8 BC powders a day.        Social Determinants of Health   Financial Resource Strain: Not on file  Food Insecurity: Not on file  Transportation Needs: No Transportation Needs   Lack of Transportation (Medical): No   Lack of Transportation (Non-Medical): No  Physical Activity: Not on file  Stress: Not on file  Social Connections: Not on file    Allergies:  Allergies  Allergen Reactions   Morphine And Related Other (See Comments)    "Makes me crazy"   Trazodone And Nefazodone Other (See Comments)    Metabolic Disorder Labs: Lab Results  Component Value Date   HGBA1C 5.4 10/03/2016   MPG 108 10/03/2016   Lab Results  Component Value Date   PROLACTIN 21.6 10/03/2016   Lab Results  Component Value Date   CHOL 208 (H) 10/03/2016   TRIG 100 04/27/2019   HDL 128 10/03/2016   CHOLHDL 1.6 10/03/2016   VLDL 9 10/03/2016   LDLCALC 71 10/03/2016   Lab Results  Component Value Date   TSH 0.175 (L) 05/01/2020   TSH 0.210 (L) 04/21/2019    Therapeutic Level Labs: No results found for: LITHIUM No results found for: VALPROATE No components found for:  CBMZ  Current Medications: Current Outpatient Medications  Medication Sig Dispense Refill   albuterol (VENTOLIN HFA) 108 (90 Base) MCG/ACT inhaler Inhale 2 puffs into the lungs 4 (four) times daily as needed for wheezing or shortness of breath (cough).      Amino Acids (AMINO ACID PO) Take 1 tablet by mouth daily.     Aspirin-Salicylamide-Caffeine (BC HEADACHE PO) Take 6-7 packets by mouth daily as needed (headache).     benztropine (COGENTIN) 0.5 MG tablet Take 1 tablet (0.5 mg total) by mouth at bedtime. 90 tablet 1   buPROPion (WELLBUTRIN SR) 150 MG 12 hr tablet Take 1 tablet (150 mg total) by mouth 2 (two) times daily. 90 tablet 1   calcium carbonate (OSCAL) 1500 (600 Ca) MG TABS tablet Take 600 mg  of elemental calcium by mouth daily with breakfast.     doxepin (SINEQUAN) 75 MG capsule Take 1 capsule by mouth once a day as needed 45 capsule 0   doxepin (SINEQUAN) 75 MG capsule Take 1 capsule by mouth once a day as needed 30 capsule 0   doxepin (SINEQUAN) 75 MG capsule Take 1 capsule by mouth once a day as needed 30 capsule 0   doxepin (SINEQUAN) 75 MG capsule Take 1 capsule by mouth once a day as needed 30 capsule 0   doxepin (SINEQUAN) 75 MG capsule Take 1 capsule by mouth once a day as needed 30 capsule 0   doxepin (SINEQUAN) 75 MG capsule Take 1 capsule by mouth once a day as  needed 30 capsule 0   doxepin (SINEQUAN) 75 MG capsule Take 1 capsule by mouth once a day as needed 30 capsule 0   doxepin (SINEQUAN) 75 MG capsule Take 1 capsule by mouth once a day as needed 60 capsule 0   eszopiclone (LUNESTA) 2 MG TABS tablet Take 1 tablet (2 mg total) by mouth at bedtime as needed for sleep. Take immediately before bedtime 30 tablet 3   ferrous sulfate 325 (65 FE) MG tablet Take 325 mg by mouth daily.     gabapentin (NEURONTIN) 800 MG tablet Take 800 mg by mouth 3 (three) times daily.     HYDROcodone-acetaminophen (NORCO/VICODIN) 5-325 MG tablet Take 1 tablet by mouth every 4 (four) hours as needed for moderate pain. (Patient not taking: Reported on 07/01/2020) 16 tablet 0   hydrOXYzine (ATARAX) 10 MG tablet Take 1 tablet (10 mg total) by mouth 3 (three) times daily as needed. Patient will be traveling and needs a 45 day supply 270 tablet 1   levofloxacin (LEVAQUIN) 750 MG tablet Take 1 tablet (750 mg total) by mouth daily. 6 tablet 0   levothyroxine (SYNTHROID) 75 MCG tablet Take 75 mcg by mouth daily.     methocarbamol (ROBAXIN) 500 MG tablet Take 1 tablet (500 mg total) by mouth 4 (four) times daily. (Patient not taking: Reported on 07/01/2020) 16 tablet 0   Multiple Vitamins-Minerals (MULTIVITAMIN WITH MINERALS) tablet Take 1 tablet by mouth daily.     nystatin (MYCOSTATIN/NYSTOP) powder Apply  1 application topically 3 (three) times daily. 15 g 0   Omega-3 Fatty Acids (FISH OIL OMEGA-3 PO) Take 1 tablet by mouth daily.     omeprazole (PRILOSEC) 40 MG capsule Take 40 mg by mouth daily.  0   polyethylene glycol (MIRALAX / GLYCOLAX) 17 g packet Take 17 g by mouth daily.     promethazine (PHENERGAN) 25 MG tablet Take 25 mg by mouth daily as needed for nausea or vomiting.   0   QUEtiapine (SEROQUEL) 400 MG tablet Take 2 tablets (800 mg total) by mouth at bedtime. 180 tablet 1   ramelteon (ROZEREM) 8 MG tablet Take 1 tablet (8 mg total) by mouth at bedtime. 45 tablet 2   sertraline (ZOLOFT) 100 MG tablet Take 1 tablet (100 mg total) by mouth daily after breakfast. 90 tablet 1   No current facility-administered medications for this visit.     Musculoskeletal: Strength & Muscle Tone:  N/A Gait & Station: N/A Patient leans: N/A  Psychiatric Specialty Exam: Review of Systems  Psychiatric/Behavioral:  Negative for hallucinations, self-injury and suicidal ideas.   All other systems reviewed and are negative.  Last menstrual period 04/08/2017, unknown if currently breastfeeding.There is no height or weight on file to calculate BMI.  General Appearance: N/A  Eye Contact: N/A  Speech:  Clear and Coherent  Volume:  Normal  Mood:  Euthymic  Affect:  NA  Thought Process:  Goal Directed  Orientation:  Full (Time, Place, and Person)  Thought Content: Logical   Suicidal Thoughts:  No  Homicidal Thoughts:  No  Memory:  Immediate;   Good Recent;   Good Remote;   Good  Judgement:  Good  Insight:  Good  Psychomotor Activity:  NA  Concentration:  Concentration: Good and Attention Span: Good  Recall:  Good  Fund of Knowledge: Good  Language: Good  Akathisia:  NA  Handed:  Right  AIMS (if indicated): not done  Assets:  Communication Skills Desire for Improvement  ADL's:  Intact  Cognition: WNL  Sleep:  Good   Screenings: AIMS    Flowsheet Row Admission (Discharged) from  10/02/2016 in [facility]  AIMS Total Score 0      AUDIT    Flowsheet Row Admission (Discharged) from 10/02/2016 in [place] [facility]  Alcohol Use Disorder Identification Test Final Score (AUDIT) 25      GAD-7    Flowsheet Row Video Visit from 06/21/2021 in [facility] Video Visit from 12/20/2020 in [facility] Office Visit from 07/01/2020 in [place] [facility] Counselor from 03/17/2020 in [facility]  Total GAD-7 Score 8 4 5 11       PHQ2-9    Flowsheet Row Video Visit from 06/21/2021 in [facility] Video Visit from 12/20/2020 in [facility] Office Visit from 07/01/2020 in [place] [facility] Counselor from 03/17/2020 in [facility]  PHQ-2 Total Score 3 0 1 4  PHQ-9 Total Score 14 5 5 12       Flowsheet Row ED from 09/29/2021 in [place] DEPT Video Visit from 12/20/2020 in [place] No Risk No Risk        Assessment and Plan: Sharone Riolo is a 56 year old female presenting to [facility] behavioral health outpatient for a follow up psychiatric evaluation.  She has a past psychiatric history of alcohol use disorder in remission, major depressive disorder, and bipolar disorder.  Her symptoms are managed with Zoloft 100 mg daily, Cogentin 0.5 mg daily, Seroquel 800 mg at bedtime, hydroxyzine 10 mg 3 times daily as needed for anxiety, Wellbutrin SR 150 mg twice daily. She reports medication compliance and effectiveness. She denies adverse medication effects. Patient denied the need for dosage adjustments today.  No medication changes made today.   Medications refilled.  Medication benefits versus risk discussed.  Collaboration of Care: Collaboration of Care: Medication  Management AEB medications E scribed to patient's preferred pharmacy  1. Bipolar I disorder (HCC)  - benztropine (COGENTIN) 0.5 MG tablet; Take 1 tablet (0.5 mg total) by mouth at bedtime.  Dispense: 90 tablet; Refill: 1 - QUEtiapine (SEROQUEL) 400 MG tablet; Take 2 tablets (800 mg total) by mouth at bedtime.  Dispense: 60 tablet; Refill: 1  2. Major depressive disorder with single episode, in partial remission (HCC)  - buPROPion (WELLBUTRIN SR) 150 MG 12 hr tablet; Take 1 tablet (150 mg total) by mouth 2 (two) times daily.  Dispense: 60 tablet; Refill: 1 - hydrOXYzine (ATARAX) 10 MG tablet; Take 1 tablet (10 mg total) by mouth 3 (three) times daily as needed for anxiety. Patient will be traveling and needs a 45 day supply  Dispense: 90 tablet; Refill: 1 - QUEtiapine (SEROQUEL) 400 MG tablet; Take 2 tablets (800 mg total) by mouth at bedtime.  Dispense: 60 tablet; Refill: 1 - sertraline (ZOLOFT) 100 MG tablet; Take 1 tablet (100 mg total) by mouth daily after breakfast.  Dispense: 30 tablet; Refill: 1    Follow-up in 6 weeks  Patient/Guardian was advised Release of Information must be obtained prior to any record release in order to collaborate their care with an outside provider. Patient/Guardian was advised if they have not already done so to contact the registration department to sign all necessary forms in order for [place] to release information regarding their care.   Consent: Patient/Guardian  gives verbal consent for treatment and assignment of benefits for services provided during this visit. Patient/Guardian expressed understanding and agreed to proceed.    [name], NP 12/12/2021, 9:48 AM

## 2022-01-12 ENCOUNTER — Telehealth (HOSPITAL_COMMUNITY): Payer: Self-pay | Admitting: *Deleted

## 2022-01-12 NOTE — Telephone Encounter (Signed)
Call from patient stating her disability attorney notified her that the request for her medical records came back as no such person found. Informed her that this clinic doesn't do anything with medical records and that dept is not in this building and provided her with the number to medical records as [phone]. ?

## 2022-01-19 ENCOUNTER — Telehealth (HOSPITAL_COMMUNITY): Payer: Self-pay | Admitting: *Deleted

## 2022-01-19 NOTE — Telephone Encounter (Signed)
Call from patient, She is stating she is helped by [facility] but it needs to be 3 mg, 2 mg which is what she takes isnt quite enough to her. Explained to her there is a NP taking over for Brittney NP while she is on leave and would need to first see her before changes could be considered. Remined her of her appt, states she has a conflict on the day and needs to reschedule. Moved her call to the front desk to reschedule. Will notify Ce Ce PEnn NP of her request but no changes expected till her appt. ?

## 2022-01-24 ENCOUNTER — Telehealth (HOSPITAL_COMMUNITY): Payer: Self-pay | Admitting: *Deleted

## 2022-01-24 NOTE — Telephone Encounter (Signed)
VM from patient stating she needs her Mary Dodson called in to her preferred pharmacy of [facility]. The rx several months ago had been moved per her report to a pharmacy is [place] when she was in [place] to visit with her daughter and grandchildren. She state her pharmacy said it cant be moved a second time even though she has a remaining refill on it. Will forward this concern to the provider. She has a future appt on 03/02/22 and last seen in mid march by [place]ston Neighbor NP. ?

## 2022-01-25 ENCOUNTER — Other Ambulatory Visit (HOSPITAL_COMMUNITY): Payer: Self-pay | Admitting: Psychiatry

## 2022-01-25 MED ORDER — ESZOPICLONE 2 MG PO TABS: 2.0000 mg | ORAL_TABLET | Freq: Every evening | ORAL | 1 refills | Status: DC | PRN

## 2022-01-25 NOTE — Telephone Encounter (Signed)
Refill for Lunesta 2mg  , 30 day sent to [facility], with 1 refill ?

## 2022-01-26 ENCOUNTER — Telehealth (HOSPITAL_COMMUNITY): Payer: Self-pay | Admitting: *Deleted

## 2022-01-26 NOTE — Telephone Encounter (Signed)
Fax request from [facility] for patients bupropion hcl to be sent in for 90 days. She should be out as the last rx we submitted was for one month.  ?

## 2022-02-09 ENCOUNTER — Other Ambulatory Visit (HOSPITAL_COMMUNITY): Payer: Self-pay | Admitting: Psychiatry

## 2022-02-09 DIAGNOSIS — F319 Bipolar disorder, unspecified: Secondary | ICD-10-CM

## 2022-02-09 DIAGNOSIS — F324 Major depressive disorder, single episode, in partial remission: Secondary | ICD-10-CM

## 2022-02-09 MED ORDER — BUPROPION HCL ER (SR) 150 MG PO TB12: 150.0000 mg | ORAL_TABLET | Freq: Two times a day (BID) | ORAL | 0 refills | Status: DC

## 2022-02-10 ENCOUNTER — Telehealth (HOSPITAL_COMMUNITY): Payer: Self-pay | Admitting: *Deleted

## 2022-02-10 NOTE — Telephone Encounter (Signed)
Called for medical records info as her disability hearing is coming up very soon. Provided her with number for medical records [phone]. ?

## 2022-02-21 ENCOUNTER — Ambulatory Visit (INDEPENDENT_AMBULATORY_CARE_PROVIDER_SITE_OTHER): Payer: No Payment, Other | Admitting: Clinical

## 2022-02-21 DIAGNOSIS — F319 Bipolar disorder, unspecified: Secondary | ICD-10-CM

## 2022-02-21 NOTE — Progress Notes (Signed)
THERAPIST PROGRESS NOTE Virtual Visit via Telephone Note  I connected with Mary Dodson on 02/21/2022 at  3:00 PM EDT by telephone and verified that I am speaking with the correct person using two identifiers.  Location: Patient: home Provider: office   I discussed the limitations, risks, security and privacy concerns of performing an evaluation and management service by telephone and the availability of in person appointments. I also discussed with the patient that there may be a patient responsible charge related to this service. The patient expressed understanding and agreed to proceed.   Follow Up Instructions: I discussed the assessment and treatment plan with the patient. The patient was provided an opportunity to ask questions and all were answered. The patient agreed with the plan and demonstrated an understanding of the instructions.   The patient was advised to call back or seek an in-person evaluation if the symptoms worsen or if the condition fails to improve as anticipated.   Session Time: 35 minutes  Participation Level: Active  Behavioral Response: CasualAlertEuthymic  Type of Therapy: Individual Therapy  Treatment Goals addressed: Mary Dodson will practice behavioral activation skills 3 times per week for the next 12 weeks  ProgressTowards Goals: Progressing  Interventions: CBT and Supportive  Summary:  Mary Dodson is a 56 y.o. female who presents for the scheduled session oriented times five and friendly. Client denied hallucinations and delusions. Client reported on today she is doing fairly well. Client reported she is back at home after visiting her daughters. Client reported she has been concerned about her eldest daughter who is now pregnant with a 94 and 26 year old children. Client reported she is not in a healthy relationship which she has come to learn after her daughter giving appearance of being happy and confident. Client reported being surprised by her  daughter "masking" her true emotions. Client reported likewise learning about her sisters dealing with stress from work and having a son who struggles with mental illness being surprised that she deals with depression. Client reported she feels like she is only one in her family who does hide issues with needing mental health services. Client reported she has been having issues with anxiety increasingly. Client reported she has been falling, blurry vision, and feeling faint. Client reported she experiences these symptoms 4x per week. Client reported she has a diagnosis of sleep apnea. Client reported she has a hard time using the machine because it is hard to get it properly placed on her. Evidence of progress towards goal:  client reported anxiety symptoms and frequency of occurrence. Client reported she practice coping skills at least 3x per week such as gratitude and positive reframing.    Suicidal/Homicidal: Nowithout intent/plan  Therapist Response:  Therapist began the appointment asking the client how she had been doing since she was last seen. Therapist used CBT to engage using active listening and positive emotional support. Therapist used CBT to engage and ask the client to describe her anxiety symptoms.  Therapist used CBT to engage and discuss education about panic attacks and coping skills such as aromatherapy, sensory items and music. Therapist used CBT ask the client to identify her progress with frequency of use with coping skills with continued practice in her daily activity.    Therapist assign the client homework to practice using things that are calming to the five senses. Client was scheduled for next appointment.   Plan: Return again in 5 weeks.  Diagnosis: bipolar 1 disorder  Collaboration of Care: Patient refused  AEB none requested by the client.  Patient/Guardian was advised Release of Information must be obtained prior to any record release in order to collaborate  their care with an outside provider. Patient/Guardian was advised if they have not already done so to contact the registration department to sign all necessary forms in order for [place] to release information regarding their care.   Consent: Patient/Guardian gives verbal consent for treatment and assignment of benefits for services provided during this visit. Patient/Guardian expressed understanding and agreed to proceed.   Mary Rhymes Alayiah Fontes, LCSW 02/21/2022

## 2022-02-22 ENCOUNTER — Other Ambulatory Visit: Payer: Self-pay

## 2022-02-22 DIAGNOSIS — Z1231 Encounter for screening mammogram for malignant neoplasm of breast: Secondary | ICD-10-CM

## 2022-02-23 ENCOUNTER — Encounter: Payer: Self-pay | Admitting: *Deleted

## 2022-02-24 NOTE — Plan of Care (Signed)
  Problem: Depression CCP Problem  1  Goal: LTG: [facility] WILL SCORE LESS THAN 10 ON THE PATIENT HEALTH QUESTIONNAIRE (PHQ-9) Outcome: Progressing Goal: STG: Mary Dodson WILL IDENTIFY 3 COGNITIVE PATTERNS AND BELIEFS THAT SUPPORT DEPRESSION Outcome: Progressing Goal: STG: Mary Dodson WILL PRACTICE BEHAVIORAL ACTIVATION SKILLS 3 TIMES PER WEEK FOR THE NEXT 12 WEEKS Outcome: Progressing   

## 2022-02-27 ENCOUNTER — Telehealth (HOSPITAL_COMMUNITY): Payer: No Payment, Other | Admitting: Psychiatry

## 2022-03-02 ENCOUNTER — Telehealth (INDEPENDENT_AMBULATORY_CARE_PROVIDER_SITE_OTHER): Payer: No Payment, Other | Admitting: Psychiatry

## 2022-03-02 ENCOUNTER — Encounter (HOSPITAL_COMMUNITY): Payer: Self-pay | Admitting: Psychiatry

## 2022-03-02 DIAGNOSIS — F324 Major depressive disorder, single episode, in partial remission: Secondary | ICD-10-CM

## 2022-03-02 DIAGNOSIS — F319 Bipolar disorder, unspecified: Secondary | ICD-10-CM

## 2022-03-02 MED ORDER — HYDROXYZINE HCL 10 MG PO TABS: 10.0000 mg | ORAL_TABLET | Freq: Three times a day (TID) | ORAL | 3 refills | Status: DC | PRN

## 2022-03-02 MED ORDER — BENZTROPINE MESYLATE 0.5 MG PO TABS: 0.5000 mg | ORAL_TABLET | Freq: Every day | ORAL | 3 refills | Status: DC

## 2022-03-02 MED ORDER — BUPROPION HCL ER (SR) 150 MG PO TB12: 150.0000 mg | ORAL_TABLET | Freq: Two times a day (BID) | ORAL | 3 refills | Status: DC

## 2022-03-02 MED ORDER — QUETIAPINE FUMARATE 400 MG PO TABS: 800.0000 mg | ORAL_TABLET | Freq: Every day | ORAL | 3 refills | Status: DC

## 2022-03-02 MED ORDER — SERTRALINE HCL 100 MG PO TABS: 100.0000 mg | ORAL_TABLET | Freq: Every day | ORAL | 3 refills | Status: DC

## 2022-03-02 MED ORDER — ESZOPICLONE 3 MG PO TABS: 3.0000 mg | ORAL_TABLET | Freq: Every evening | ORAL | 1 refills | Status: DC | PRN

## 2022-03-02 NOTE — Progress Notes (Signed)
BH MD/PA/NP OP Progress Note Virtual Visit via Telephone Note  I connected with Mary Dodson on 03/02/22 at  2:00 PM EDT by telephone and verified that I am speaking with the correct person using two identifiers.  Location: Patient: home Provider: Clinic   I discussed the limitations, risks, security and privacy concerns of performing an evaluation and management service by telephone and the availability of in person appointments. I also discussed with the patient that there may be a patient responsible charge related to this service. The patient expressed understanding and agreed to proceed.   I provided 30 minutes of non-face-to-face time during this encounter.      03/02/2022 11:40 AM Mary Dodson  MRN:  [id number]  Chief Complaint:  "I increased my Lunesta because I was not sleeping"  HPI:  56 year old female seen today for followup psychiatric evaluation.   She has a history of alcohol use disorder (in remission), major depression, and bipolar 1 disorder.  She is currently prescribed Zoloft 100 mg in the morning, Cogentin 0.5 mg daily,   quetiapine 800 mg at bedtime, doxepin 75 mg (for itching) at bedtime as needed (prescribed by PCP), and gabapentin 800 mg 4 times a day (prescribed by her PCP). She reports that she has her doxepin and gabapentin filled at the [facility]  Patient notes that her medications are effective in managing her psychiatric conditions.   Today patient was unable to login virtually so assessment done over the phone.  During exam she was pleasant, cooperative, and engaged in conversation.  She informed Clinical research associatewriter that over a month ago she increased her Lunesta to 3 mg because she was having difficulty sleeping.  She informed Clinical research associatewriter that since the increase she has been  sleeping effective and notes that she now sleeps 7 hours.  Patient notes at times she feels anxious and depressed but reports that she is able to cope with it. Provider conducted  a GAD-7 and patient scored a 11.  She notes that she worries about her daughter and her grandchildren.  Provider also conducted a a  PHQ 9 and patient scored a 11.  She endorses adequate appetite. Today she denies SI/HI/VAH, mania or paranoia.  Today provider agreeable to the increase of Lunesta to 3 mg.  Provider informed patient that this dose will be reduced over time.  She endorsed understanding and agreed.  She will also follow-up with outpatient counseling for therapy.  No other concerns noted at this time.   Visit Diagnosis:    ICD-10-CM   1. Bipolar I disorder (HCC)  F31.9 benztropine (COGENTIN) 0.5 MG tablet    QUEtiapine (SEROQUEL) 400 MG tablet    eszopiclone 3 MG TABS    2. Major depressive disorder with single episode, in partial remission (HCC)  F32.4 buPROPion (WELLBUTRIN SR) 150 MG 12 hr tablet    hydrOXYzine (ATARAX) 10 MG tablet    sertraline (ZOLOFT) 100 MG tablet    QUEtiapine (SEROQUEL) 400 MG tablet      Past Psychiatric History:  alcohol use disorder (in remission), major depression, and bipolar 1disorder.  Past Medical History:  Past Medical History:  Diagnosis Date   Alcohol abuse    Bipolar disease, chronic (HCC)    Hypothyroidism (acquired)    PTSD (post-traumatic stress disorder)    History reviewed. No pertinent surgical history.  Family Psychiatric History:  Mother committed suicide in [id number]  Family History:  Family History  Problem Relation Age of Onset   Cancer Mother  suicide   Breast cancer Maternal Grandmother    Cancer Maternal Grandmother    Heart attack Father     Social History:  Social History   Socioeconomic History   Marital status: Single    Spouse name: Not on file   Number of children: 2   Years of education: Not on file   Highest education level: Bachelor's degree (e.g., BA, AB, BS)  Occupational History   Occupation: unemployed  Tobacco Use   Smoking status: Former    Packs/day: 1.00    Types: Cigarettes     Quit date: 04/30/2019    Years since quitting: 2.8   Smokeless tobacco: Never  Vaping Use   Vaping Use: Some days  Substance and Sexual Activity   Alcohol use: Not Currently    Comment: recovering alcoholic   Drug use: No   Sexual activity: Not Currently    Birth control/protection: None  Other Topics Concern   Not on file  Social History Narrative   ** Merged History Encounter **       ** Merged History Encounter **      Patient is right-handed. She lives alone in a one level home. She drinks 5 cups of coffee a week, and 6 cans of Diet-Pepsi a day. Patient states she uses 6-8 BC powders a day.        Social Determinants of Health   Financial Resource Strain: Not on file  Food Insecurity: Not on file  Transportation Needs: No Transportation Needs   Lack of Transportation (Medical): No   Lack of Transportation (Non-Medical): No  Physical Activity: Not on file  Stress: Not on file  Social Connections: Not on file    Allergies:  Allergies  Allergen Reactions   Morphine And Related Other (See Comments)    "Makes me crazy"   Trazodone And Nefazodone Other (See Comments)    Metabolic Disorder Labs: Lab Results  Component Value Date   HGBA1C 5.4 10/03/2016   MPG 108 10/03/2016   Lab Results  Component Value Date   PROLACTIN 21.6 10/03/2016   Lab Results  Component Value Date   CHOL 208 (H) 10/03/2016   TRIG 100 04/27/2019   HDL 128 10/03/2016   CHOLHDL 1.6 10/03/2016   VLDL 9 10/03/2016   LDLCALC 71 10/03/2016   Lab Results  Component Value Date   TSH 0.175 (L) 05/01/2020   TSH 0.210 (L) 04/21/2019    Therapeutic Level Labs: No results found for: LITHIUM No results found for: VALPROATE No components found for:  CBMZ  Current Medications: Current Outpatient Medications  Medication Sig Dispense Refill   albuterol (VENTOLIN HFA) 108 (90 Base) MCG/ACT inhaler Inhale 2 puffs into the lungs 4 (four) times daily as needed for wheezing or shortness of breath  (cough).      Amino Acids (AMINO ACID PO) Take 1 tablet by mouth daily.     Aspirin-Salicylamide-Caffeine (BC HEADACHE PO) Take 6-7 packets by mouth daily as needed (headache).     benztropine (COGENTIN) 0.5 MG tablet Take 1 tablet (0.5 mg total) by mouth at bedtime. 90 tablet 3   buPROPion (WELLBUTRIN SR) 150 MG 12 hr tablet Take 1 tablet (150 mg total) by mouth 2 (two) times daily. 60 tablet 3   calcium carbonate (OSCAL) 1500 (600 Ca) MG TABS tablet Take 600 mg of elemental calcium by mouth daily with breakfast.     doxepin (SINEQUAN) 75 MG capsule Take 1 capsule by mouth once a day as needed 45  capsule 0   doxepin (SINEQUAN) 75 MG capsule Take 1 capsule by mouth once a day as needed 30 capsule 0   doxepin (SINEQUAN) 75 MG capsule Take 1 capsule by mouth once a day as needed 30 capsule 0   doxepin (SINEQUAN) 75 MG capsule Take 1 capsule by mouth once a day as needed 30 capsule 0   doxepin (SINEQUAN) 75 MG capsule Take 1 capsule by mouth once a day as needed 30 capsule 0   doxepin (SINEQUAN) 75 MG capsule Take 1 capsule by mouth once a day as needed 30 capsule 0   doxepin (SINEQUAN) 75 MG capsule Take 1 capsule by mouth once a day as needed 30 capsule 0   doxepin (SINEQUAN) 75 MG capsule Take 1 capsule by mouth once a day as needed 60 capsule 0   eszopiclone 3 MG TABS Take 1 tablet (3 mg total) by mouth at bedtime as needed for sleep. Take immediately before bedtime 30 tablet 1   ferrous sulfate 325 (65 FE) MG tablet Take 325 mg by mouth daily.     gabapentin (NEURONTIN) 800 MG tablet Take 800 mg by mouth 3 (three) times daily.     HYDROcodone-acetaminophen (NORCO/VICODIN) 5-325 MG tablet Take 1 tablet by mouth every 4 (four) hours as needed for moderate pain. (Patient not taking: Reported on 07/01/2020) 16 tablet 0   hydrOXYzine (ATARAX) 10 MG tablet Take 1 tablet (10 mg total) by mouth 3 (three) times daily as needed for anxiety. Patient will be traveling and needs a 45 day supply 90 tablet 3    levofloxacin (LEVAQUIN) 750 MG tablet Take 1 tablet (750 mg total) by mouth daily. 6 tablet 0   levothyroxine (SYNTHROID) 75 MCG tablet Take 75 mcg by mouth daily.     methocarbamol (ROBAXIN) 500 MG tablet Take 1 tablet (500 mg total) by mouth 4 (four) times daily. (Patient not taking: Reported on 07/01/2020) 16 tablet 0   Multiple Vitamins-Minerals (MULTIVITAMIN WITH MINERALS) tablet Take 1 tablet by mouth daily.     nystatin (MYCOSTATIN/NYSTOP) powder Apply 1 application topically 3 (three) times daily. 15 g 0   Omega-3 Fatty Acids (FISH OIL OMEGA-3 PO) Take 1 tablet by mouth daily.     omeprazole (PRILOSEC) 40 MG capsule Take 40 mg by mouth daily.  0   polyethylene glycol (MIRALAX / GLYCOLAX) 17 g packet Take 17 g by mouth daily.     promethazine (PHENERGAN) 25 MG tablet Take 25 mg by mouth daily as needed for nausea or vomiting.   0   QUEtiapine (SEROQUEL) 400 MG tablet Take 2 tablets (800 mg total) by mouth at bedtime. 60 tablet 3   ramelteon (ROZEREM) 8 MG tablet Take 1 tablet (8 mg total) by mouth at bedtime. 45 tablet 2   sertraline (ZOLOFT) 100 MG tablet Take 1 tablet (100 mg total) by mouth daily after breakfast. 30 tablet 3   No current facility-administered medications for this visit.     Musculoskeletal: Strength & Muscle Tone:  Unable to asses due to telephone visit. Gait & Station:  Unable to asses due to telephone visit.  Patient leans: N/A  Psychiatric Specialty Exam: Review of Systems  Last menstrual period 04/08/2017, unknown if currently breastfeeding.There is no height or weight on file to calculate BMI.  General Appearance:  Unable to assess due to telephone visit  Eye Contact:   Unable to assess due to telephone visit  Speech:  Clear and Coherent and Normal Rate  Volume:  Normal  Mood:  Euthymic,   Affect:  Appropriate and Congruent  Thought Process:  Coherent, Goal Directed and Linear  Orientation:  Full (Time, Place, and Person)  Thought Content: WDL and  Logical   Suicidal Thoughts:  No  Homicidal Thoughts:  No  Memory:  Immediate;   Good Recent;   Good Remote;   Good  Judgement:  Good  Insight:  Good  Psychomotor Activity:  Normal  Concentration:  Concentration: Good and Attention Span: Good  Recall:  Good  Fund of Knowledge: Good  Language: Good  Akathisia:  No  Handed:  Right  AIMS (if indicated): Not done  Assets:  Communication Skills Desire for Improvement Financial Resources/Insurance Housing Social Support  ADL's:  Intact  Cognition: WNL  Sleep:  Good   Screenings: AIMS    Flowsheet Row [facility]  AIMS Total Score 0      AUDIT    Flowsheet Row [facility]  Alcohol Use Disorder Identification Test Final Score (AUDIT) 25      GAD-7    Flowsheet Row Video Visit from 03/02/2022 in [facility] Video Visit from 06/21/2021 in [facility] Video Visit from 12[facility]20/2020 in [facility] Office Visit from 07/01/2020 in [place] [facility] Counselor from 03/17/2020 in [facility]  Total GAD-7 Score PHQ2-9    Flowsheet Row Video Visit from 03/02/2022 in [facility] Video Visit from 06/21/2021 in [facility] Video Visit from 12/20/2020 in [facility] Office Visit from 07/01/2020 in [place] [facility] Counselor from 03/17/2020 in [facility]  PHQ-2 Total Score 3 3 0 1 4  PHQ-9 Total Score Flowsheet Row ED from 09/29/2021 in [facility] Video Visit from 12/20/2020 in [facility]  C-SSRS RISK CATEGORY No Risk No Risk        Assessment and Plan: Today  patient notes she increased her Lunesta because she was unable to sleep.  Provider was agreeable to increasing Lunesta to 3 mg however informed her that it would be reduced over time.  She endorsed understanding and agreed.  She will continue on the medications as prescribed. 1. Bipolar I disorder (HCC)  Continue- benztropine (COGENTIN) 0.5 MG tablet; Take 1 tablet (0.5 mg total) by mouth at bedtime.  Dispense: 90 tablet; Refill: 3 Continue- QUEtiapine (SEROQUEL) 400 MG tablet; Take 2 tablets (800 mg total) by mouth at bedtime.  Dispense: 60 tablet; Refill: 3 Increased- eszopiclone 3 MG TABS; Take 1 tablet (3 mg total) by mouth at bedtime as needed for sleep. Take immediately before bedtime  Dispense: 30 tablet; Refill: 1  2. Major depressive disorder with single episode, in partial remission (HCC)  Continue- buPROPion (WELLBUTRIN SR) 150 MG 12 hr tablet; Take 1 tablet (150 mg total) by mouth 2 (two) times daily.  Dispense: 60 tablet; Refill: 3 Continue- hydrOXYzine (ATARAX) 10 MG tablet; Take 1 tablet (10 mg total) by mouth 3 (three) times daily as needed for anxiety. Patient will be traveling and needs a 45 day supply  Dispense: 90 tablet; Refill: 3 Continue- sertraline (ZOLOFT) 100 MG tablet; Take 1 tablet (100 mg total) by mouth  daily after breakfast.  Dispense: 30 tablet; Refill: 3 Continue- QUEtiapine (SEROQUEL) 400 MG tablet; Take 2 tablets (800 mg total) by mouth at bedtime.  Dispense: 60 tablet; Refill: 3     Follow-up in 3 months Follow-up with therapy     Shanna Cisco, NP 03/02/2022, 11:40 AM

## 2022-03-21 ENCOUNTER — Ambulatory Visit (HOSPITAL_COMMUNITY): Payer: No Payment, Other | Admitting: Clinical

## 2022-04-07 ENCOUNTER — Ambulatory Visit (INDEPENDENT_AMBULATORY_CARE_PROVIDER_SITE_OTHER): Payer: No Payment, Other | Admitting: Clinical

## 2022-04-07 DIAGNOSIS — F319 Bipolar disorder, unspecified: Secondary | ICD-10-CM

## 2022-04-08 NOTE — Progress Notes (Signed)
THERAPIST PROGRESS NOTE Virtual Visit via Telephone Note  I connected with Mary Dodson on 04/07/2022 at 10:00 AM EDT by telephone and verified that I am speaking with the correct person using two identifiers.  Location: Patient: home Provider: office   I discussed the limitations, risks, security and privacy concerns of performing an evaluation and management service by telephone and the availability of in person appointments. I also discussed with the patient that there may be a patient responsible charge related to this service. The patient expressed understanding and agreed to proceed.   Follow Up Instructions: I discussed the assessment and treatment plan with the patient. The patient was provided an opportunity to ask questions and all were answered. The patient agreed with the plan and demonstrated an understanding of the instructions.   The patient was advised to call back or seek an in-person evaluation if the symptoms worsen or if the condition fails to improve as anticipated.   Session Time: 40 minutes  Participation Level: Active  Behavioral Response: CasualAlertAnxious  Type of Therapy: Individual Therapy  Treatment Goals addressed: Client will practice behavioral activation skills 3 times per week for the next 12 weeks  ProgressTowards Goals: Progressing  Interventions: CBT and Supportive  Summary:  Mary Dodson is a 56 y.o. female who presents for the scheduled session oriented x5, friendly and cooperative.  Client denied hallucinations and delusions. Client reported on today she has been doing fairly well but feeling on edge.  Client reported since seeing her daughters her eldest had a miscarriage.  Client reported her daughters have vocalized wanting to be closer to her and see her mom often.  Client reported she does not have the means to travel and stay with them for a long time like she wishes.  Client reported she remembers from her childhood that her  mother was poor but her mother did give her a lot of love and she felt that was all she needed.  Client reported she has been thinking about her life goals and barriers that are still present that she needs to figure out how to work towards.  Client reported she wants to work on obtaining her driver's license.  Client reported she has been feeling a bit anxious about her health lately.  Client reported during the last week she has felt her lungs rattling but has no apparent fever.  Client reported after having pneumonia and symptoms of sepsis earlier this year in March she wants to go be seen by Dr. today to stay proactive about her symptoms.  Client reported her mood has been stable overall but still has episodes of anxiety with panic.  Client reported she is medication compliant. Evidence of progress towards goal: Client is medication compliant 7 out of 7 days a week.  Client continues to identify 1 life goals she would like to accomplish short-term.    Suicidal/Homicidal: Nowithout intent/plan  Therapist Response:  Therapist began the appointment asking the client how she has been doing since last seen. Therapist used CBT to engage using active listening and positive emotional support. Therapist used CBT to ask the client about stressors negatively impacting her emotional wellbeing and how it correlates to childhood experience. Therapist used CBT to discuss problem-solving life stressors to help alleviating unwanted emotions. Therapist used CBT to encourage the client to take care of her physical health and seek medical attention as she feels necessary. Therapist used CBT ask the client to identify her progress with frequency of use with coping  skills with continued practice in her daily activity.    Therapist assigned client homework to practice stress management techniques/activity to help with anxiety. Client was scheduled for next appointment.    Plan: Return again in 4 weeks.  Diagnosis:  Bipolar 1 disorder  Collaboration of Care: Patient refused AEB none requested by the client.  Patient/Guardian was advised Release of Information must be obtained prior to any record release in order to collaborate their care with an outside provider. Patient/Guardian was advised if they have not already done so to contact the registration department to sign all necessary forms in order for [place] to release information regarding their care.   Consent: Patient/Guardian gives verbal consent for treatment and assignment of benefits for services provided during this visit. Patient/Guardian expressed understanding and agreed to proceed.   Neena Rhymes Aariona Momon, LCSW 04/07/2022

## 2022-04-08 NOTE — Plan of Care (Signed)
  Problem: Depression CCP Problem  1  Goal: STG: Mary Dodson WILL IDENTIFY 3 COGNITIVE PATTERNS AND BELIEFS THAT SUPPORT DEPRESSION Outcome: Progressing Goal: STG: Mary Dodson WILL PRACTICE BEHAVIORAL ACTIVATION SKILLS 3 TIMES PER WEEK FOR THE NEXT 12 WEEKS Outcome: Progressing   Problem: Depression CCP Problem  1  Goal: LTG: Mary Dodson WILL SCORE LESS THAN 10 ON THE PATIENT HEALTH QUESTIONNAIRE (PHQ-9) Outcome: Not Progressing

## 2022-04-11 ENCOUNTER — Emergency Department (HOSPITAL_COMMUNITY): Admission: EM | Admit: 2022-04-11 | Payer: Medicaid Other | Source: Home / Self Care

## 2022-04-21 ENCOUNTER — Other Ambulatory Visit: Payer: Self-pay

## 2022-04-24 ENCOUNTER — Other Ambulatory Visit: Payer: Self-pay

## 2022-04-25 ENCOUNTER — Other Ambulatory Visit: Payer: Self-pay

## 2022-04-25 ENCOUNTER — Telehealth: Payer: Self-pay

## 2022-04-25 NOTE — Telephone Encounter (Signed)
[place] patient assistance application faxed to [employer] (f) [phone] for enrollment processing today.

## 2022-04-26 ENCOUNTER — Other Ambulatory Visit (HOSPITAL_COMMUNITY): Payer: Self-pay | Admitting: Psychiatry

## 2022-04-26 DIAGNOSIS — F319 Bipolar disorder, unspecified: Secondary | ICD-10-CM

## 2022-05-01 ENCOUNTER — Other Ambulatory Visit: Payer: Self-pay

## 2022-05-01 ENCOUNTER — Telehealth: Payer: Self-pay

## 2022-05-01 NOTE — Telephone Encounter (Signed)
Patient is enrolled in the [place] [employer] Patient Assistance Program for [place] until 04/29/2023.  Medication ships to patient's home address.  Patient may call [phone] for shipment status/refills.

## 2022-05-02 ENCOUNTER — Ambulatory Visit (INDEPENDENT_AMBULATORY_CARE_PROVIDER_SITE_OTHER): Payer: No Payment, Other | Admitting: Clinical

## 2022-05-02 DIAGNOSIS — F319 Bipolar disorder, unspecified: Secondary | ICD-10-CM | POA: Diagnosis not present

## 2022-05-02 NOTE — Progress Notes (Signed)
THERAPIST PROGRESS NOTE Virtual Visit via Video Note  I connected with Mary Dodson on 05/02/22 at  9:00 AM EDT by a video enabled telemedicine application and verified that I am speaking with the correct person using two identifiers.  Location: Patient: home Provider: office   I discussed the limitations of evaluation and management by telemedicine and the availability of in person appointments. The patient expressed understanding and agreed to proceed.   Follow Up Instructions: I discussed the assessment and treatment plan with the patient. The patient was provided an opportunity to ask questions and all were answered. The patient agreed with the plan and demonstrated an understanding of the instructions.   The patient was advised to call back or seek an in-person evaluation if the symptoms worsen or if the condition fails to improve as anticipated.   Session Time: 40 minutes  Participation Level: Active  Behavioral Response: CasualAlertDepressed  Type of Therapy: Individual Therapy  Treatment Goals addressed: client will practice behavioral activation skills 3 times per week for the next 12 weeks  ProgressTowards Goals: Progressing  Interventions: CBT and Supportive  Summary:  Mary Dodson is a 56 y.o. female who presents for the scheduled session oriented times five, appropriately dressed, and friendly. Client denied hallucinations and delusions. Client reported on today she is doing fairly well. Client reported she has been worried about her eldest daughter who has her grandchildren. Client reported her daughter has bad anxiety and she received a call from her late last night and was on the phone with her until early this morning. Client reported she recalls some time ago when her granddaughter said seeing her parents argue makes her cry. Client reported she recalls her childhood and how she grew up poor with her mother and grandmother. Client reported she didn't have  much but was satisfied with the love she received from them. Client reported she grew up with having high self esteem and wants the same for her daughters. Client reported she still grieves her mother and grandmother although its been 30 years. Client reported she wants to work on processing that constant feeling.  Evidence of progress towards goal:  client reported 1 positive which is improved sleep since the psychiatrist added Lunesta to her regimen.   Suicidal/Homicidal: Nowithout intent/plan  Therapist Response:  Therapist began the appointment asking the client how she has been doing since last seen. Therapist used CBT to engage using active listening and positive emotional support. Therapist used CBT to engage and ask the client to describe changes to her medication regimen which have been helpful. Therapist used CBT to engage and discuss with the client her feelings of depression and related childhood experiences. Therapist used CBT to engage with the client to identify positive she can take away from negative situations. Therapist used CBT ask the client to identify her progress with frequency of use with coping skills with continued practice in her daily activity.    Therapist assigned the client homework to practice self care.    Plan: Return again in 5 weeks.  Diagnosis: bipolar 1 disorder  Collaboration of Care: Patient refused AEB none requested by the client.  Patient/Guardian was advised Release of Information must be obtained prior to any record release in order to collaborate their care with an outside provider. Patient/Guardian was advised if they have not already done so to contact the registration department to sign all necessary forms in order for [place] to release information regarding their care.   Consent: Patient/Guardian gives  verbal consent for treatment and assignment of benefits for services provided during this visit. Patient/Guardian expressed understanding and  agreed to proceed.   Neena Rhymes Mary Schissler, LCSW 05/02/2022

## 2022-05-06 NOTE — Plan of Care (Signed)
  Problem: Depression CCP Problem  1  Goal: LTG: Raylan WILL SCORE LESS THAN 10 ON THE PATIENT HEALTH QUESTIONNAIRE (PHQ-9) Outcome: Progressing Goal: STG: Penni WILL IDENTIFY 3 COGNITIVE PATTERNS AND BELIEFS THAT SUPPORT DEPRESSION Outcome: Progressing Goal: STG: Quentina WILL PRACTICE BEHAVIORAL ACTIVATION SKILLS 3 TIMES PER WEEK FOR THE NEXT 12 WEEKS Outcome: Progressing

## 2022-05-16 ENCOUNTER — Ambulatory Visit (INDEPENDENT_AMBULATORY_CARE_PROVIDER_SITE_OTHER): Payer: No Payment, Other | Admitting: Clinical

## 2022-05-16 DIAGNOSIS — F319 Bipolar disorder, unspecified: Secondary | ICD-10-CM

## 2022-05-20 NOTE — Plan of Care (Signed)
  Problem: Depression CCP Problem  1  Goal: LTG: Enna WILL SCORE LESS THAN 10 ON THE PATIENT HEALTH QUESTIONNAIRE (PHQ-9) Outcome: Progressing Goal: STG: Jahni WILL IDENTIFY 3 COGNITIVE PATTERNS AND BELIEFS THAT SUPPORT DEPRESSION Outcome: Progressing Goal: STG: Kayliah WILL PRACTICE BEHAVIORAL ACTIVATION SKILLS 3 TIMES PER WEEK FOR THE NEXT 12 WEEKS Outcome: Progressing

## 2022-05-20 NOTE — Progress Notes (Signed)
   THERAPIST PROGRESS NOTE Virtual Visit via Telephone Note  I connected with Mary Dodson on 05/16/2022 at  2:00 PM EDT by telephone and verified that I am speaking with the correct person using two identifiers.  Location: Patient: home Provider: office   I discussed the limitations, risks, security and privacy concerns of performing an evaluation and management service by telephone and the availability of in person appointments. I also discussed with the patient that there may be a patient responsible charge related to this service. The patient expressed understanding and agreed to proceed.   Follow Up Instructions: I discussed the assessment and treatment plan with the patient. The patient was provided an opportunity to ask questions and all were answered. The patient agreed with the plan and demonstrated an understanding of the instructions.   The patient was advised to call back or seek an in-person evaluation if the symptoms worsen or if the condition fails to improve as anticipated.   Session Time: 30 minutes  Participation Level: Active  Behavioral Response: NAAlertEuthymic  Type of Therapy: Individual Therapy  Treatment Goals addressed: client will practice behavioral activation skills 3 times per week for the next 12 weeks  ProgressTowards Goals: Progressing  Interventions: CBT and Supportive  Summary:  Mary Dodson is a 56 y.o. female who presents for the scheduled sessio oriented times five, appropriately dressed, and friendly.Client denied hallucinations and delusions. Client reported she is doing fairly okay. Client reported she has been under stress with her daughter. Client reported her daughter called her in distress about her relationship. Client reported she didn't know her daughter has such low self esteem. Client reported otherwise she has been working to obtain her license back. Client reported her anxiety continues to persist with sporadic panic attacks.  Client reported she has managed her anxiety by doing grounding techniques. Client reported she is medication compliant and responding well. Evidence of progress towards goal:  client is medication compliant 7 days out of week.   Suicidal/Homicidal: Nowithout intent/plan  Therapist Response:  Therapist began the appointment asking the client how she has been doing since last seen. Therapist used CBT to engage using active listening and positive emotional support. Therapist used CBT to engage and ask the client about stressors. Therapist used CBT to engage the client with processing her negative emotions and anxiety triggers. Therapist used CBT ask the client to identify her progress with frequency of use with coping skills with continued practice in her daily activity.    Therapist assigned the client homework to practice self care.    Plan: Return again in 5 weeks.  Diagnosis: bipolar 1 disorder  Collaboration of Care: Patient refused AEB none requested by the client.  Patient/Guardian was advised Release of Information must be obtained prior to any record release in order to collaborate their care with an outside provider. Patient/Guardian was advised if they have not already done so to contact the registration department to sign all necessary forms in order for [place] to release information regarding their care.   Consent: Patient/Guardian gives verbal consent for treatment and assignment of benefits for services provided during this visit. Patient/Guardian expressed understanding and agreed to proceed.   Neena Rhymes Kionte Baumgardner, LCSW 05/16/2022

## 2022-05-23 ENCOUNTER — Ambulatory Visit: Payer: No Typology Code available for payment source

## 2022-05-23 DIAGNOSIS — Z1211 Encounter for screening for malignant neoplasm of colon: Secondary | ICD-10-CM

## 2022-05-25 ENCOUNTER — Telehealth (HOSPITAL_COMMUNITY): Payer: Self-pay | Admitting: *Deleted

## 2022-05-25 ENCOUNTER — Other Ambulatory Visit (HOSPITAL_COMMUNITY): Payer: Self-pay | Admitting: Psychiatry

## 2022-05-25 ENCOUNTER — Other Ambulatory Visit (HOSPITAL_COMMUNITY): Payer: Self-pay

## 2022-05-25 DIAGNOSIS — F324 Major depressive disorder, single episode, in partial remission: Secondary | ICD-10-CM

## 2022-05-25 DIAGNOSIS — F319 Bipolar disorder, unspecified: Secondary | ICD-10-CM

## 2022-05-25 MED ORDER — BENZTROPINE MESYLATE 0.5 MG PO TABS: 0.5000 mg | ORAL_TABLET | Freq: Every day | ORAL | 3 refills | Status: DC

## 2022-05-25 MED ORDER — DOXEPIN HCL 75 MG PO CAPS
75.0000 mg | ORAL_CAPSULE | Freq: Every day | ORAL | 0 refills | Status: DC | PRN
  Filled 2022-05-25: qty 30, 30d supply, fill #0

## 2022-05-25 MED ORDER — ESZOPICLONE 3 MG PO TABS: ORAL_TABLET | ORAL | 0 refills | Status: DC

## 2022-05-25 MED ORDER — BUPROPION HCL ER (SR) 150 MG PO TB12: 150.0000 mg | ORAL_TABLET | Freq: Two times a day (BID) | ORAL | 3 refills | Status: AC

## 2022-05-25 NOTE — Telephone Encounter (Signed)
Medication refilled and sent to preferred pharmacy,

## 2022-05-25 NOTE — Telephone Encounter (Signed)
Patient called  & "stated that tomorrow is Friday & she needs a refill"  Appt 05/31/22

## 2022-05-29 ENCOUNTER — Other Ambulatory Visit (HOSPITAL_COMMUNITY): Payer: Self-pay

## 2022-05-31 ENCOUNTER — Encounter (HOSPITAL_COMMUNITY): Payer: Self-pay | Admitting: Psychiatry

## 2022-05-31 ENCOUNTER — Telehealth (INDEPENDENT_AMBULATORY_CARE_PROVIDER_SITE_OTHER): Payer: No Payment, Other | Admitting: Psychiatry

## 2022-05-31 DIAGNOSIS — F319 Bipolar disorder, unspecified: Secondary | ICD-10-CM

## 2022-05-31 MED ORDER — BENZTROPINE MESYLATE 0.5 MG PO TABS: 0.5000 mg | ORAL_TABLET | Freq: Every day | ORAL | 3 refills | Status: DC

## 2022-05-31 MED ORDER — SERTRALINE HCL 100 MG PO TABS: 100.0000 mg | ORAL_TABLET | Freq: Every day | ORAL | 3 refills | Status: DC

## 2022-05-31 MED ORDER — HYDROXYZINE HCL 10 MG PO TABS: 10.0000 mg | ORAL_TABLET | Freq: Three times a day (TID) | ORAL | 3 refills | Status: DC | PRN

## 2022-05-31 MED ORDER — QUETIAPINE FUMARATE 400 MG PO TABS: 800.0000 mg | ORAL_TABLET | Freq: Every day | ORAL | 3 refills | Status: DC

## 2022-05-31 MED ORDER — ESZOPICLONE 3 MG PO TABS: ORAL_TABLET | ORAL | 0 refills | Status: DC

## 2022-05-31 NOTE — Progress Notes (Signed)
BH MD/PA/NP OP Progress Note Virtual Visit via Video Note  I connected with Mary Dodson on 05/31/22 at 11:30 AM EDT by a video enabled telemedicine application and verified that I am speaking with the correct person using two identifiers.  Location: Patient: Home Provider: Clinic   I discussed the limitations of evaluation and management by telemedicine and the availability of in person appointments. The patient expressed understanding and agreed to proceed.  I provided 30 minutes of non-face-to-face time during this encounter.        05/31/2022 12:00 PM Mary Dodson  MRN:  [id number]  Chief Complaint:  "I am doing well"  HPI:  56 year old female seen today for followup psychiatric evaluation.   She has a history of alcohol use disorder (in remission 6 years), major depression, and bipolar 1 disorder.  She is currently prescribed Zoloft 100 mg in the morning, Cogentin 0.5 mg daily, quetiapine 800 mg at bedtime, doxepin 75 mg (for itching) at bedtime as needed (prescribed by PCP), and gabapentin 800 mg 4 times a day (prescribed by her PCP). She reports that she has her doxepin and gabapentin filled at the [facility]).  Patient notes that her medications are effective in managing her psychiatric conditions.   Today patient was well groomed, pleasant, cooperative, engaged in conversation, and maintained eye contact.  She informed Clinical research associate that she has been doing well. She reports that she will be moving to [place] to live with her daughter who has been having issues with her relationship. She notes that she plans to leave in September after completing her alcohol use courses at the [facility] and getting her drivers licence back. Patient reports that he mood is stable and notes that her anxiety and depression are under controlled. Today provider conducted a GAD-7 and patient scored a 0.  Provider also conducted a a  PHQ 9 and patient scored a 0.  She endorses adequate  appetite. Today she denies SI/HI/VAH, mania or paranoia.  No medication changes made today. Patient agreeable to continue medications as prescribed. She will also follow-up with outpatient counseling for therapy.  Provider informed patient that since she is moving to [place] she will need to seek treatment from another psychiatric provider.  She endorsed understanding and agreed.  No other concerns noted at this time.   Visit Diagnosis:    ICD-10-CM   1. Bipolar I disorder (HCC)  F31.9 benztropine (COGENTIN) 0.5 MG tablet    Eszopiclone 3 MG TABS    hydrOXYzine (ATARAX) 10 MG tablet    QUEtiapine (SEROQUEL) 400 MG tablet    sertraline (ZOLOFT) 100 MG tablet      Past Psychiatric History:  alcohol use disorder (in remission), major depression, and bipolar 1disorder.  Past Medical History:  Past Medical History:  Diagnosis Date   Alcohol abuse    Bipolar disease, chronic (HCC)    Hypothyroidism (acquired)    PTSD (post-traumatic stress disorder)    History reviewed. No pertinent surgical history.  Family Psychiatric History:  Mother committed suicide in 54  Family History:  Family History  Problem Relation Age of Onset   Cancer Mother        suicide   Breast cancer Maternal Grandmother    Cancer Maternal Grandmother    Heart attack Father     Social History:  Social History   Socioeconomic History   Marital status: Single    Spouse name: Not on file   Number of children: 2   Years of  education: Not on file   Highest education level: Bachelor's degree (e.g., BA, AB, BS)  Occupational History   Occupation: unemployed  Tobacco Use   Smoking status: Former    Packs/day: 1.00    Types: Cigarettes    Quit date: 04/30/2019    Years since quitting: 3.0   Smokeless tobacco: Never  Vaping Use   Vaping Use: Some days  Substance and Sexual Activity   Alcohol use: Not Currently    Comment: recovering alcoholic   Drug use: No   Sexual activity: Not Currently    Birth  control/protection: None  Other Topics Concern   Not on file  Social History Narrative   ** Merged History Encounter **       ** Merged History Encounter **      Patient is right-handed. She lives alone in a one level home. She drinks 5 cups of coffee a week, and 6 cans of Diet-Pepsi a day. Patient states she uses 6-8 BC powders a day.        Social Determinants of Health   Financial Resource Strain: Not on file  Food Insecurity: Not on file  Transportation Needs: No Transportation Needs (05/12/2021)   PRAPARE - Administrator, Civil Service (Medical): No    Lack of Transportation (Non-Medical): No  Physical Activity: Not on file  Stress: Not on file  Social Connections: Not on file    Allergies:  Allergies  Allergen Reactions   Morphine And Related Other (See Comments)    "Makes me crazy"   Trazodone And Nefazodone Other (See Comments)    Metabolic Disorder Labs: Lab Results  Component Value Date   HGBA1C 5.4 10/03/2016   MPG 108 10/03/2016   Lab Results  Component Value Date   PROLACTIN 21.6 10/03/2016   Lab Results  Component Value Date   CHOL 208 (H) 10/03/2016   TRIG 100 04/27/2019   HDL 128 10/03/2016   CHOLHDL 1.6 10/03/2016   VLDL 9 10/03/2016   LDLCALC 71 10/03/2016   Lab Results  Component Value Date   TSH 0.175 (L) 05/01/2020   TSH 0.210 (L) 04/21/2019    Therapeutic Level Labs: No results found for: "LITHIUM" No results found for: "VALPROATE" No results found for: "CBMZ"  Current Medications: Current Outpatient Medications  Medication Sig Dispense Refill   albuterol (VENTOLIN HFA) 108 (90 Base) MCG/ACT inhaler Inhale 2 puffs into the lungs 4 (four) times daily as needed for wheezing or shortness of breath (cough).      Amino Acids (AMINO ACID PO) Take 1 tablet by mouth daily.     Aspirin-Salicylamide-Caffeine (BC HEADACHE PO) Take 6-7 packets by mouth daily as needed (headache).     benztropine (COGENTIN) 0.5 MG tablet Take 1  tablet (0.5 mg total) by mouth at bedtime. 90 tablet 3   buPROPion (WELLBUTRIN SR) 150 MG 12 hr tablet Take 1 tablet (150 mg total) by mouth 2 (two) times daily. 60 tablet 3   calcium carbonate (OSCAL) 1500 (600 Ca) MG TABS tablet Take 600 mg of elemental calcium by mouth daily with breakfast.     Eszopiclone 3 MG TABS TAKE 1 TABLET BY MOUTH EVERY DAY AT BEDTIME AS NEEDED FOR SLEEP TAKE  IMMEDIATELY  BEFORE  BEDTIME 30 tablet 0   ferrous sulfate 325 (65 FE) MG tablet Take 325 mg by mouth daily.     gabapentin (NEURONTIN) 800 MG tablet Take 800 mg by mouth 3 (three) times daily.  HYDROcodone-acetaminophen (NORCO/VICODIN) 5-325 MG tablet Take 1 tablet by mouth every 4 (four) hours as needed for moderate pain. (Patient not taking: Reported on 07/01/2020) 16 tablet 0   hydrOXYzine (ATARAX) 10 MG tablet Take 1 tablet (10 mg total) by mouth 3 (three) times daily as needed for anxiety. Patient will be traveling and needs a 45 day supply 90 tablet 3   levofloxacin (LEVAQUIN) 750 MG tablet Take 1 tablet (750 mg total) by mouth daily. 6 tablet 0   levothyroxine (SYNTHROID) 75 MCG tablet Take 75 mcg by mouth daily.     methocarbamol (ROBAXIN) 500 MG tablet Take 1 tablet (500 mg total) by mouth 4 (four) times daily. (Patient not taking: Reported on 07/01/2020) 16 tablet 0   Multiple Vitamins-Minerals (MULTIVITAMIN WITH MINERALS) tablet Take 1 tablet by mouth daily.     nystatin (MYCOSTATIN/NYSTOP) powder Apply 1 application topically 3 (three) times daily. 15 g 0   Omega-3 Fatty Acids (FISH OIL OMEGA-3 PO) Take 1 tablet by mouth daily.     omeprazole (PRILOSEC) 40 MG capsule Take 40 mg by mouth daily.  0   polyethylene glycol (MIRALAX / GLYCOLAX) 17 g packet Take 17 g by mouth daily.     promethazine (PHENERGAN) 25 MG tablet Take 25 mg by mouth daily as needed for nausea or vomiting.   0   QUEtiapine (SEROQUEL) 400 MG tablet Take 2 tablets (800 mg total) by mouth at bedtime. 60 tablet 3   sertraline (ZOLOFT)  100 MG tablet Take 1 tablet (100 mg total) by mouth daily after breakfast. 30 tablet 3   No current facility-administered medications for this visit.     Musculoskeletal: Strength & Muscle Tone: within normal limits and Telehealth visit  Gait & Station: normal, Telehealth visit Patient leans: N/A  Psychiatric Specialty Exam: Review of Systems  Last menstrual period 04/08/2017, unknown if currently breastfeeding.There is no height or weight on file to calculate BMI.  General Appearance: Well Groomed  Eye Contact:  Good  Speech:  Clear and Coherent and Normal Rate  Volume:  Normal  Mood:  Euthymic,   Affect:  Appropriate and Congruent  Thought Process:  Coherent, Goal Directed and Linear  Orientation:  Full (Time, Place, and Person)  Thought Content: WDL and Logical   Suicidal Thoughts:  No  Homicidal Thoughts:  No  Memory:  Immediate;   Good Recent;   Good Remote;   Good  Judgement:  Good  Insight:  Good  Psychomotor Activity:  Normal  Concentration:  Concentration: Good and Attention Span: Good  Recall:  Good  Fund of Knowledge: Good  Language: Good  Akathisia:  No  Handed:  Right  AIMS (if indicated): Not done  Assets:  Communication Skills Desire for Improvement Financial Resources/Insurance Housing Social Support  ADL's:  Intact  Cognition: WNL  Sleep:  Good   Screenings: AIMS    Flowsheet Row [facility]  AIMS Total Score 0      AUDIT    Flowsheet Row [facility]  Alcohol Use Disorder Identification Test Final Score (AUDIT) 25      GAD-7    Flowsheet Row Video Visit from 05/31/2022 in [facility] Counselor from 05/02/2022 in [facility] Video Visit from 03/02/2022 in [facility] Video Visit from 06/21/2021 in [facility] Video Visit from 12/20/2020 in  [facility]  Total GAD-7 Score 0 11 11 8 4       PHQ2-9    Flowsheet Row Video Visit from 05/31/2022 in [facility] Counselor from 05/02/2022 in [facility] Video Visit from 03/02/2022 in [facility] Video Visit from 06/21/2021 in [facility] Video Visit from 12/20/2020 in [facility]  PHQ-2 Total Score 0 2 3 3  0  PHQ-9 Total Score 0 8 11 14 5       Flowsheet Row ED from 09/29/2021 in [facility] Video Visit from 12/20/2020 in [facility]  C-SSRS RISK CATEGORY No Risk No Risk        Assessment and Plan: Today patient notes she is doing well on her current medication regimen.  No medication changes made today.  Patient agreeable to continue medication as prescribed.  Patient reports that she will be moving to [facility].  She will find a new psychiatric provider for medication management.  1. Bipolar I disorder (HCC)  Continue- benztropine (COGENTIN) 0.5 MG tablet; Take 1 tablet (0.5 mg total) by mouth at bedtime.  Dispense: 90 tablet; Refill: 3 Continue- Eszopiclone 3 MG TABS; TAKE 1 TABLET BY MOUTH EVERY DAY AT BEDTIME AS NEEDED FOR SLEEP TAKE  IMMEDIATELY  BEFORE  BEDTIME  Dispense: 30 tablet; Refill: 0 Continue- hydrOXYzine (ATARAX) 10 MG tablet; Take 1 tablet (10 mg total) by mouth 3 (three) times daily as needed for anxiety. Patient will be traveling and needs a 45 day supply  Dispense: 90 tablet; Refill: 3 Continue- QUEtiapine (SEROQUEL) 400 MG tablet; Take 2 tablets (800 mg total) by mouth at bedtime.  Dispense: 60 tablet; Refill: 3 Continue- sertraline (ZOLOFT) 100 MG tablet; Take 1 tablet (100 mg total) by mouth daily after breakfast.  Dispense: 30 tablet; Refill: 3        Follow-up in 3  months Follow-up with therapy     12/22/2020, NP 05/31/2022, 12:00 PM

## 2022-06-16 ENCOUNTER — Telehealth (HOSPITAL_COMMUNITY): Payer: Self-pay | Admitting: *Deleted

## 2022-06-16 NOTE — Telephone Encounter (Signed)
VM on nurse line from patient stating she will be moving to [place] on Oct 4th and would like a 90 day supply of all her medicines sent in to [facility] to help her have time to arrange psych care in [place]. Will forward this request to her provider to consider.

## 2022-06-19 ENCOUNTER — Other Ambulatory Visit (HOSPITAL_COMMUNITY): Payer: Self-pay | Admitting: Psychiatry

## 2022-06-19 DIAGNOSIS — F324 Major depressive disorder, single episode, in partial remission: Secondary | ICD-10-CM

## 2022-06-19 DIAGNOSIS — F319 Bipolar disorder, unspecified: Secondary | ICD-10-CM

## 2022-06-19 MED ORDER — ESZOPICLONE 3 MG PO TABS: ORAL_TABLET | ORAL | 0 refills | Status: DC

## 2022-06-19 MED ORDER — BENZTROPINE MESYLATE 0.5 MG PO TABS: 0.5000 mg | ORAL_TABLET | Freq: Every day | ORAL | 1 refills | Status: DC

## 2022-06-19 MED ORDER — QUETIAPINE FUMARATE 400 MG PO TABS: 800.0000 mg | ORAL_TABLET | Freq: Every day | ORAL | 1 refills | Status: DC

## 2022-06-19 MED ORDER — SERTRALINE HCL 100 MG PO TABS: 100.0000 mg | ORAL_TABLET | Freq: Every day | ORAL | 1 refills | Status: DC

## 2022-06-19 MED ORDER — HYDROXYZINE HCL 10 MG PO TABS: 10.0000 mg | ORAL_TABLET | Freq: Three times a day (TID) | ORAL | 1 refills | Status: AC | PRN

## 2022-06-19 NOTE — Telephone Encounter (Signed)
Medication refilled and sent to preferred pharmacy

## 2022-06-21 ENCOUNTER — Other Ambulatory Visit (HOSPITAL_COMMUNITY): Payer: Self-pay | Admitting: Psychiatry

## 2022-06-21 ENCOUNTER — Telehealth (HOSPITAL_COMMUNITY): Payer: Self-pay | Admitting: *Deleted

## 2022-06-21 DIAGNOSIS — F319 Bipolar disorder, unspecified: Secondary | ICD-10-CM

## 2022-06-21 MED ORDER — ESZOPICLONE 3 MG PO TABS: ORAL_TABLET | ORAL | 0 refills | Status: DC

## 2022-06-21 NOTE — Telephone Encounter (Signed)
VM from patient asking that her generic Mary Dodson be moved to [place] pharmacy on [address] as she states [facility] doesn't carry or manage it.

## 2022-07-04 ENCOUNTER — Ambulatory Visit
Admission: RE | Admit: 2022-07-04 | Discharge: 2022-07-04 | Disposition: A | Discharge status: Home / Self Care | Payer: No Typology Code available for payment source | Source: Ambulatory Visit | Attending: *Deleted | Admitting: *Deleted

## 2022-07-04 ENCOUNTER — Ambulatory Visit: Payer: Self-pay | Admitting: *Deleted

## 2022-07-04 VITALS — BP 112/86 | Wt 181.4 lb

## 2022-07-04 DIAGNOSIS — Z1211 Encounter for screening for malignant neoplasm of colon: Secondary | ICD-10-CM

## 2022-07-04 DIAGNOSIS — Z1239 Encounter for other screening for malignant neoplasm of breast: Secondary | ICD-10-CM

## 2022-07-04 DIAGNOSIS — Z1231 Encounter for screening mammogram for malignant neoplasm of breast: Secondary | ICD-10-CM

## 2022-07-04 NOTE — Patient Instructions (Signed)
Explained breast self awareness with [name]. Patient did not need a Pap smear today due to last Pap smear was 06/28/2022 per patient. Let her know that based on her history that her next Pap smear will be due in one year. Referred patient to the [facility] for a screening mammogram on the mobile unit. Appointment scheduled Tuesday, July 04, 2022 at 1530. Patient aware of appointment and will be there. Let patient know the [facility] will follow up with her within the next couple weeks with results of her mammogram by letter or phone. Aron Needles verbalized understanding.  Rinoa Garramone, Arvil Chaco, RN 2:18 PM

## 2022-07-04 NOTE — Progress Notes (Signed)
[name] is a 56 y.o. female who presents to [facility] clinic today with no complaints.    Pap Smear: Pap smear not completed today. Last Pap smear was 06/28/2022 at the [facility] and was normal per patient. Patients previous Pap smear was 04/13/2020 at [facility] clinic and was normal with negative HPV. Patient has history of multiple abnormal Pap smears 03/07/2018 that was ASCUS with negative HPV, 08/01/2017 at the [facility] and ASCUS. Per patient she has had three other abnormal Pap smears when she was 22, 36, and 45. Patient states she thinks she had a colposcopy to follow-up after the abnormal Pap smear when she was 45. Last Pap smear was available in Epic.  Physical exam: Breasts Breasts symmetrical. No skin abnormalities bilateral breasts. No nipple retraction bilateral breasts. No nipple discharge bilateral breasts. No lymphadenopathy. No lumps palpated bilateral breasts. No complaints of pain or tenderness on exam.  MS DIGITAL SCREENING TOMO BILATERAL  Result Date: 05/16/2021 CLINICAL DATA:  Screening. EXAM: DIGITAL SCREENING BILATERAL MAMMOGRAM WITH TOMOSYNTHESIS AND CAD TECHNIQUE: Bilateral screening digital craniocaudal and mediolateral oblique mammograms were obtained. Bilateral screening digital breast tomosynthesis was performed. The images were evaluated with computer-aided detection. COMPARISON:  Previous exam(s). ACR Breast Density Category b: There are scattered areas of fibroglandular density. FINDINGS: There are no findings suspicious for malignancy. IMPRESSION: No mammographic evidence of malignancy. A result letter of this screening mammogram will be mailed directly to the patient. RECOMMENDATION: Screening mammogram in one year. (Code:SM-B-01Y) BI-RADS CATEGORY  1: Negative. Electronically Signed   By: Amie Portland M.D.   On: 05/16/2021 13:07   MS DIGITAL SCREENING TOMO BILATERAL  Result Date: 04/15/2020 CLINICAL DATA:  Screening. EXAM: DIGITAL SCREENING BILATERAL MAMMOGRAM WITH TOMO AND  CAD COMPARISON:  Previous exam(s). ACR Breast Density Category b: There are scattered areas of fibroglandular density. FINDINGS: There are no findings suspicious for malignancy. Images were processed with CAD. IMPRESSION: No mammographic evidence of malignancy. A result letter of this screening mammogram will be mailed directly to the patient. RECOMMENDATION: Screening mammogram in one year. (Code:SM-B-01Y) BI-RADS CATEGORY  1: Negative. Electronically Signed   By: Gerome Sam III M.D   On: 04/15/2020 12:31   MS DIGITAL SCREENING TOMO BILATERAL  Result Date: 04/07/2019 CLINICAL DATA:  Screening. EXAM: DIGITAL SCREENING BILATERAL MAMMOGRAM WITH TOMO AND CAD COMPARISON:  Previous exam(s). ACR Breast Density Category b: There are scattered areas of fibroglandular density. FINDINGS: There are no findings suspicious for malignancy. Images were processed with CAD. IMPRESSION: No mammographic evidence of malignancy. A result letter of this screening mammogram will be mailed directly to the patient. RECOMMENDATION: Screening mammogram in one year. (Code:SM-B-01Y) BI-RADS CATEGORY  1: Negative. Electronically Signed   By: Sande Brothers M.D.   On: 04/07/2019 13:13   MS DIGITAL SCREENING TOMO BILATERAL  Result Date: 03/07/2018 CLINICAL DATA:  Screening. EXAM: DIGITAL SCREENING BILATERAL MAMMOGRAM WITH TOMO AND CAD COMPARISON:  Previous exam(s). ACR Breast Density Category b: There are scattered areas of fibroglandular density. FINDINGS: There are no findings suspicious for malignancy. Images were processed with CAD. IMPRESSION: No mammographic evidence of malignancy. A result letter of this screening mammogram will be mailed directly to the patient. RECOMMENDATION: Screening mammogram in one year. (Code:SM-B-01Y) BI-RADS CATEGORY  1: Negative. Electronically Signed   By: Britta Mccreedy M.D.   On: 03/07/2018 15:42    Pelvic/Bimanual Pap is not indicated today per BCCCP guidelines.   Smoking History: Patient is  a former smoker that quit 04/30/2019.  Patient Navigation: Patient education provided. Access to services provided for patient through [facility] program.   Colorectal Cancer Screening: Per patient has a history of having a colonoscopy completed in May 2018 in [place]. FIT Test given to patient to complete. No complaints today.    Breast and Cervical Cancer Risk Assessment: Patient has a family history of her maternal grandmother having breast cancer. Patient has no known genetic mutations or history of  radiation treatment to the chest before age 34. Per patient has a history of cervical dysplasia. Patient has no history of being immunocompromised, or DES exposure in-utero.  Risk Assessment     Risk Scores       07/04/2022 05/12/2021   Last edited by: Royston Bake, CMA McGill, Sherie Mamie Nick, LPN   5-year risk: 2.4 % 2.3 %   Lifetime risk: 15.9 % 16.2 %            A: BCCCP exam without pap smear No complaints.  P: Referred patient to the [place] for a screening mammogram on the mobile unit. Appointment scheduled Tuesday, July 04, 2022 at 1530.  Loletta Parish, RN 07/04/2022 2:17 PM

## 2022-07-06 ENCOUNTER — Other Ambulatory Visit: Payer: Self-pay | Admitting: Obstetrics and Gynecology

## 2022-07-06 DIAGNOSIS — R928 Other abnormal and inconclusive findings on diagnostic imaging of breast: Secondary | ICD-10-CM

## 2022-07-10 ENCOUNTER — Other Ambulatory Visit: Payer: Self-pay | Admitting: Obstetrics and Gynecology

## 2022-07-10 ENCOUNTER — Ambulatory Visit
Admission: RE | Admit: 2022-07-10 | Discharge: 2022-07-10 | Disposition: A | Discharge status: Home / Self Care | Payer: No Typology Code available for payment source | Source: Ambulatory Visit | Attending: Obstetrics and Gynecology | Admitting: Obstetrics and Gynecology

## 2022-07-10 DIAGNOSIS — R928 Other abnormal and inconclusive findings on diagnostic imaging of breast: Secondary | ICD-10-CM

## 2022-07-10 DIAGNOSIS — N6489 Other specified disorders of breast: Secondary | ICD-10-CM

## 2022-07-16 LAB — FECAL OCCULT BLOOD, IMMUNOCHEMICAL: Fecal Occult Bld: NEGATIVE

## 2022-07-19 ENCOUNTER — Telehealth: Payer: Self-pay

## 2022-07-19 NOTE — Telephone Encounter (Addendum)
Patient returned call, was informed negative FIT test results, verbalized understanding.    Attempted to contact patient regarding lab results. Left message on voicemail requesting return call.

## 2022-07-26 ENCOUNTER — Telehealth (HOSPITAL_COMMUNITY): Payer: Self-pay | Admitting: *Deleted

## 2022-07-26 NOTE — Telephone Encounter (Signed)
Patient called stated she's been trying to reach you & asked if we could give her  #

## 2022-08-24 ENCOUNTER — Telehealth (INDEPENDENT_AMBULATORY_CARE_PROVIDER_SITE_OTHER): Payer: No Payment, Other | Admitting: Psychiatry

## 2022-08-24 ENCOUNTER — Encounter (HOSPITAL_COMMUNITY): Payer: Self-pay | Admitting: Psychiatry

## 2022-08-24 DIAGNOSIS — F319 Bipolar disorder, unspecified: Secondary | ICD-10-CM

## 2022-08-24 MED ORDER — QUETIAPINE FUMARATE 400 MG PO TABS: 800.0000 mg | ORAL_TABLET | Freq: Every day | ORAL | 1 refills | Status: AC

## 2022-08-24 MED ORDER — BENZTROPINE MESYLATE 0.5 MG PO TABS: 0.5000 mg | ORAL_TABLET | Freq: Every day | ORAL | 1 refills | Status: AC

## 2022-08-24 MED ORDER — ESZOPICLONE 3 MG PO TABS: ORAL_TABLET | ORAL | 0 refills | Status: AC

## 2022-08-24 MED ORDER — SERTRALINE HCL 100 MG PO TABS: 100.0000 mg | ORAL_TABLET | Freq: Every day | ORAL | 1 refills | Status: AC

## 2022-08-24 NOTE — Progress Notes (Signed)
BH MD/PA/NP OP Progress Note Virtual Visit via Video Note  I connected with Mary Dodson on 08/24/22 at  3:00 PM EST by a video enabled telemedicine application and verified that I am speaking with the correct person using two identifiers.  Location: Patient: Home Provider: Clinic   I discussed the limitations of evaluation and management by telemedicine and the availability of in person appointments. The patient expressed understanding and agreed to proceed.  I provided 30 minutes of non-face-to-face time during this encounter.          08/24/2022 3:32 PM Mary Dodson  MRN:  [id number]  Chief Complaint:  "Things are moving slowly"  HPI:  56 year old female seen today for followup psychiatric evaluation.   She has a history of alcohol use disorder (in remission 6 years), major depression, and bipolar 1 disorder.  She is currently prescribed Zoloft 100 mg in the morning, Eszopiclone 3 mg tablets nightly, Cogentin 0.5 mg daily, quetiapine 800 mg at bedtime, doxepin 75 mg (for itching) at bedtime as needed (prescribed by PCP), and gabapentin 800 mg 4 times a day (prescribed by her PCP). She reports that she has her doxepin and gabapentin filled at [facility]  Patient notes that her medications are effective in managing her psychiatric conditions.   Today patient was unable to login virtually so her assessment was done over the phone. During exam patient was pleasant, cooperative, and engaged in conversation.  She informed Clinical research associatewriter that she is frustrated because things are going slow in [place]. She notes that her  medicaid has been discontinued and her [place] medicaid just got improved. She however notes that she needs a breast biopsy and fears for her health. Patient notes that this is making her irritable as she fears dying. She notes she is in a since of panic and cries regularly. She does note that she has scheduled a primary care appointment  with a  provider in  [place]. She notes she continues to look for psychiatric care.  Mentally patient reports she finds her medications effective. Provider informed patient that [facility]  can not be continued now that she is in a different state. She endorsed understanding. Patient does not that she has some anxiety about being in limbo regarding her breast biopsy, her daughter, and her sisters health (her sister also has issues with her breast). She however notes that she has been trying to cope. Today provider conducted a GAD 7 and patient scored an 8. Provider also conducted a PHQ 9 and patient scored a 6. She endorses adequate sleep and appetite. Today she denies SI/HI/VAH, mania, or paranoia.  No medication changes made today. Patient agreeable to continue medications as prescribed.  Patient will find a psychiatric provider in [place].  No other concerns noted at this time.   Visit Diagnosis:    ICD-10-CM   1. Bipolar I disorder (HCC)  F31.9 benztropine (COGENTIN) 0.5 MG tablet    QUEtiapine (SEROQUEL) 400 MG tablet    sertraline (ZOLOFT) 100 MG tablet      Past Psychiatric History:  alcohol use disorder (in remission), major depression, and bipolar 1disorder.  Past Medical History:  Past Medical History:  Diagnosis Date   Alcohol abuse    Bipolar disease, chronic (HCC)    Hypothyroidism (acquired)    PTSD (post-traumatic stress disorder)    No past surgical history on file.  Family Psychiatric History:  Mother committed suicide in [id number]  Family History:  Family History  Problem  Relation Age of Onset   Cancer Mother        suicide   Heart attack Father    Breast cancer Maternal Grandmother    Cancer Maternal Grandmother    Breast cancer Cousin     Social History:  Social History   Socioeconomic History   Marital status: Single    Spouse name: Not on file   Number of children: 2   Years of education: Not on file   Highest education level: Bachelor's degree (e.g., BA,  AB, BS)  Occupational History   Occupation: unemployed  Tobacco Use   Smoking status: Former    Packs/day: 1.00    Types: Cigarettes    Quit date: 04/30/2019    Years since quitting: 3.3   Smokeless tobacco: Never  Vaping Use   Vaping Use: Some days  Substance and Sexual Activity   Alcohol use: Not Currently    Comment: recovering alcoholic   Drug use: No   Sexual activity: Not Currently    Birth control/protection: None  Other Topics Concern   Not on file  Social History Narrative   ** Merged History Encounter **       ** Merged History Encounter **      Patient is right-handed. She lives alone in a one level home. She drinks 5 cups of coffee a week, and 6 cans of Diet-Pepsi a day. Patient states she uses 6-8 BC powders a day.        Social Determinants of Health   Financial Resource Strain: Not on file  Food Insecurity: No Food Insecurity (07/04/2022)   Hunger Vital Sign    Worried About Running Out of Food in the Last Year: Never true    Ran Out of Food in the Last Year: Never true  Transportation Needs: No Transportation Needs (07/04/2022)   PRAPARE - Administrator, Civil Service (Medical): No    Lack of Transportation (Non-Medical): No  Physical Activity: Not on file  Stress: Not on file  Social Connections: Not on file    Allergies:  Allergies  Allergen Reactions   Morphine And Related Other (See Comments)    "Makes me crazy"   Trazodone And Nefazodone Other (See Comments)    Metabolic Disorder Labs: Lab Results  Component Value Date   HGBA1C 5.4 10/03/2016   MPG 108 10/03/2016   Lab Results  Component Value Date   PROLACTIN 21.6 10/03/2016   Lab Results  Component Value Date   CHOL 208 (H) 10/03/2016   TRIG 100 04/27/2019   HDL 128 10/03/2016   CHOLHDL 1.6 10/03/2016   VLDL 9 10/03/2016   LDLCALC 71 10/03/2016   Lab Results  Component Value Date   TSH 0.175 (L) 05/01/2020   TSH 0.210 (L) 04/21/2019    Therapeutic Level  Labs: No results found for: "LITHIUM" No results found for: "VALPROATE" No results found for: "CBMZ"  Current Medications: Current Outpatient Medications  Medication Sig Dispense Refill   albuterol (VENTOLIN HFA) 108 (90 Base) MCG/ACT inhaler Inhale 2 puffs into the lungs 4 (four) times daily as needed for wheezing or shortness of breath (cough).      Amino Acids (AMINO ACID PO) Take 1 tablet by mouth daily.     Aspirin-Salicylamide-Caffeine (BC HEADACHE PO) Take 6-7 packets by mouth daily as needed (headache).     benztropine (COGENTIN) 0.5 MG tablet Take 1 tablet (0.5 mg total) by mouth at bedtime. 270 tablet 1   buPROPion [facility] SR)  150 MG 12 hr tablet Take 1 tablet (150 mg total) by mouth 2 (two) times daily. 60 tablet 3   calcium carbonate (OSCAL) 1500 (600 Ca) MG TABS tablet Take 600 mg of elemental calcium by mouth daily with breakfast.     Eszopiclone 3 MG TABS TAKE 1 TABLET BY MOUTH EVERY DAY AT BEDTIME AS NEEDED FOR SLEEP TAKE  IMMEDIATELY  BEFORE  BEDTIME 30 tablet 0   ferrous sulfate 325 (65 FE) MG tablet Take 325 mg by mouth daily. (Patient not taking: Reported on 07/04/2022)     gabapentin (NEURONTIN) 800 MG tablet Take 800 mg by mouth 3 (three) times daily.     HYDROcodone-acetaminophen (NORCO/VICODIN) 5-325 MG tablet Take 1 tablet by mouth every 4 (four) hours as needed for moderate pain. (Patient not taking: Reported on 07/01/2020) 16 tablet 0   hydrOXYzine (ATARAX) 10 MG tablet Take 1 tablet (10 mg total) by mouth 3 (three) times daily as needed for anxiety. Patient will be traveling and needs a 45 day supply 270 tablet 1   levofloxacin (LEVAQUIN) 750 MG tablet Take 1 tablet (750 mg total) by mouth daily. 6 tablet 0   levothyroxine (SYNTHROID) 75 MCG tablet Take 75 mcg by mouth daily.     methocarbamol (ROBAXIN) 500 MG tablet Take 1 tablet (500 mg total) by mouth 4 (four) times daily. (Patient not taking: Reported on 07/01/2020) 16 tablet 0   Multiple Vitamins-Minerals  (MULTIVITAMIN WITH MINERALS) tablet Take 1 tablet by mouth daily.     nystatin (MYCOSTATIN/NYSTOP) powder Apply 1 application topically 3 (three) times daily. (Patient not taking: Reported on 07/04/2022) 15 g 0   Omega-3 Fatty Acids (FISH OIL OMEGA-3 PO) Take 1 tablet by mouth daily.     omeprazole (PRILOSEC) 40 MG capsule Take 40 mg by mouth daily.  0   polyethylene glycol (MIRALAX / GLYCOLAX) 17 g packet Take 17 g by mouth daily.     promethazine (PHENERGAN) 25 MG tablet Take 25 mg by mouth daily as needed for nausea or vomiting.   0   QUEtiapine (SEROQUEL) 400 MG tablet Take 2 tablets (800 mg total) by mouth at bedtime. 180 tablet 1   sertraline (ZOLOFT) 100 MG tablet Take 1 tablet (100 mg total) by mouth daily after breakfast. 90 tablet 1   No current facility-administered medications for this visit.     Musculoskeletal: Strength & Muscle Tone:  Unable to assess due to telephone visit Gait & Station:  Unable to assess due to telephone visit Patient leans: N/A  Psychiatric Specialty Exam: Review of Systems  Last menstrual period 04/08/2017.There is no height or weight on file to calculate BMI.  General Appearance:  Unable to assess due to telephone visit  Eye Contact:   Unable to assess due to telephone visit  Speech:  Clear and Coherent and Normal Rate  Volume:  Normal  Mood:  Euthymic,   Affect:  Appropriate and Congruent  Thought Process:  Coherent, Goal Directed and Linear  Orientation:  Full (Time, Place, and Person)  Thought Content: WDL and Logical   Suicidal Thoughts:  No  Homicidal Thoughts:  No  Memory:  Immediate;   Good Recent;   Good Remote;   Good  Judgement:  Good  Insight:  Good  Psychomotor Activity:   Unable to assess due to telephone visit  Concentration:  Concentration: Good and Attention Span: Good  Recall:  Good  Fund of Knowledge: Good  Language: Good  Akathisia:  No  Handed:  Right  AIMS (if indicated): Not done  Assets:  Communication  Skills Desire for Improvement Financial Resources/Insurance Housing Social Support  ADL's:  Intact  Cognition: WNL  Sleep:  Good   Screenings: AIMS    Flowsheet Row [facility]  AIMS Total Score 0      AUDIT    Flowsheet Row [facility]  Alcohol Use Disorder Identification Test Final Score (AUDIT) 25      GAD-7    Flowsheet Row Video Visit from 08/24/2022 in [facility] Video Visit from 05/31/2022 in [facility] Counselor from 05/02/2022 in [facility] Video Visit from 03/02/2022 in [facility] Video Visit from 06/21/2021 in [facility]  Total GAD-7 Score 8 0 11 11 8       PHQ2-9    Flowsheet Row Video Visit from 08/24/2022 in [facility] Video Visit from 05/31/2022 in [facility] Counselor from 05/02/2022 in [facility] Video Visit from 03/02/2022 in [facility] Video Visit from 06/21/2021 in [facility]  PHQ-2 Total Score 4 0 2 3 3   PHQ-9 Total Score 6 0 8 11 14       Flowsheet Row ED from 09/29/2021 in [facility] Video Visit from 12/20/2020 in [facility]  C-SSRS RISK CATEGORY No Risk No Risk        Assessment and Plan: Today patient notes she is doing well on her current medication regimen.  No medication changes made today. Patient agreeable to continue medications as prescribed.  Patient will find a psychiatric provider in [place].   1. Bipolar I disorder (HCC)  Continue- benztropine (COGENTIN) 0.5 MG tablet; Take 1 tablet (0.5 mg total) by mouth at bedtime.  Dispense: 90  tablet; Refill: 3 Continue- Eszopiclone 3 MG TABS; TAKE 1 TABLET BY MOUTH EVERY DAY AT BEDTIME AS NEEDED FOR SLEEP TAKE  IMMEDIATELY  BEFORE  BEDTIME  Dispense: 30 tablet; Refill: 0 Continue- hydrOXYzine (ATARAX) 10 MG tablet; Take 1 tablet (10 mg total) by mouth 3 (three) times daily as needed for anxiety. Patient will be traveling and needs a 45 day supply  Dispense: 90 tablet; Refill: 3 Continue- QUEtiapine (SEROQUEL) 400 MG tablet; Take 2 tablets (800 mg total) by mouth at bedtime.  Dispense: 60 tablet; Refill: 3 Continue- sertraline (ZOLOFT) 100 MG tablet; Take 1 tablet (100 mg total) by mouth daily after breakfast.  Dispense: 30 tablet; Refill: 3        Follow-up in 3 months Follow-up with therapy     12/22/2020, NP 08/24/2022, 3:32 PM

## 2023-02-16 ENCOUNTER — Other Ambulatory Visit: Payer: Self-pay

## 2023-03-16 ENCOUNTER — Other Ambulatory Visit: Payer: Self-pay

## 2023-03-20 ENCOUNTER — Other Ambulatory Visit (HOSPITAL_COMMUNITY): Payer: Self-pay | Admitting: Psychiatry

## 2023-03-23 ENCOUNTER — Other Ambulatory Visit (HOSPITAL_COMMUNITY): Payer: Self-pay | Admitting: Psychiatry

## 2023-03-23 DIAGNOSIS — F319 Bipolar disorder, unspecified: Secondary | ICD-10-CM
